# Patient Record
Sex: Female | Born: 1955
Health system: Southern US, Community
[De-identification: ages and names within clinical notes are randomized; demographics above are authoritative.]

## PROBLEM LIST (undated history)

## (undated) DIAGNOSIS — M75101 Unspecified rotator cuff tear or rupture of right shoulder, not specified as traumatic: Secondary | ICD-10-CM

## (undated) DIAGNOSIS — K219 Gastro-esophageal reflux disease without esophagitis: Secondary | ICD-10-CM

## (undated) DIAGNOSIS — M1990 Unspecified osteoarthritis, unspecified site: Secondary | ICD-10-CM

## (undated) DIAGNOSIS — F32A Depression, unspecified: Secondary | ICD-10-CM

## (undated) DIAGNOSIS — E785 Hyperlipidemia, unspecified: Secondary | ICD-10-CM

## (undated) DIAGNOSIS — Z862 Personal history of diseases of the blood and blood-forming organs and certain disorders involving the immune mechanism: Secondary | ICD-10-CM

## (undated) DIAGNOSIS — H269 Unspecified cataract: Secondary | ICD-10-CM

## (undated) DIAGNOSIS — T7840XA Allergy, unspecified, initial encounter: Secondary | ICD-10-CM

## (undated) DIAGNOSIS — F419 Anxiety disorder, unspecified: Secondary | ICD-10-CM

## (undated) DIAGNOSIS — F329 Major depressive disorder, single episode, unspecified: Secondary | ICD-10-CM

## (undated) DIAGNOSIS — J45909 Unspecified asthma, uncomplicated: Secondary | ICD-10-CM

## (undated) DIAGNOSIS — M810 Age-related osteoporosis without current pathological fracture: Secondary | ICD-10-CM

## (undated) DIAGNOSIS — J439 Emphysema, unspecified: Secondary | ICD-10-CM

## (undated) HISTORY — DX: Major depressive disorder, single episode, unspecified: F32.9

## (undated) HISTORY — DX: Allergy, unspecified, initial encounter: T78.40XA

## (undated) HISTORY — PX: EYE SURGERY: SHX253

## (undated) HISTORY — DX: Emphysema, unspecified: J43.9

## (undated) HISTORY — DX: Unspecified asthma, uncomplicated: J45.909

## (undated) HISTORY — DX: Hyperlipidemia, unspecified: E78.5

## (undated) HISTORY — PX: KNEE SURGERY: SHX244

## (undated) HISTORY — PX: ELBOW SURGERY: SHX618

## (undated) HISTORY — PX: DILATION AND CURETTAGE OF UTERUS: SHX78

## (undated) HISTORY — DX: Unspecified cataract: H26.9

## (undated) HISTORY — DX: Anxiety disorder, unspecified: F41.9

## (undated) HISTORY — PX: COLONOSCOPY: SHX174

## (undated) HISTORY — PX: ROTATOR CUFF REPAIR: SHX139

## (undated) HISTORY — PX: ABDOMINAL HYSTERECTOMY: SHX81

## (undated) HISTORY — PX: JOINT REPLACEMENT: SHX530

## (undated) HISTORY — DX: Depression, unspecified: F32.A

## (undated) HISTORY — PX: PARTIAL HYSTERECTOMY: SHX80

## (undated) HISTORY — DX: Age-related osteoporosis without current pathological fracture: M81.0

---

## 1997-11-05 ENCOUNTER — Other Ambulatory Visit: Admission: RE | Admit: 1997-11-05 | Discharge: 1997-11-05 | Payer: Self-pay | Admitting: Obstetrics and Gynecology

## 1999-07-15 ENCOUNTER — Other Ambulatory Visit: Admission: RE | Admit: 1999-07-15 | Discharge: 1999-07-15 | Payer: Self-pay | Admitting: Obstetrics and Gynecology

## 1999-07-22 ENCOUNTER — Ambulatory Visit (HOSPITAL_COMMUNITY): Admission: RE | Admit: 1999-07-22 | Discharge: 1999-07-22 | Payer: Self-pay | Admitting: Obstetrics and Gynecology

## 1999-07-22 ENCOUNTER — Encounter: Payer: Self-pay | Admitting: Obstetrics and Gynecology

## 2000-05-25 ENCOUNTER — Encounter: Admission: RE | Admit: 2000-05-25 | Discharge: 2000-05-25 | Payer: Self-pay | Admitting: Family Medicine

## 2000-05-25 ENCOUNTER — Encounter: Payer: Self-pay | Admitting: Family Medicine

## 2000-11-11 ENCOUNTER — Other Ambulatory Visit: Admission: RE | Admit: 2000-11-11 | Discharge: 2000-11-11 | Payer: Self-pay | Admitting: Obstetrics and Gynecology

## 2000-12-02 ENCOUNTER — Encounter: Payer: Self-pay | Admitting: Obstetrics and Gynecology

## 2000-12-02 ENCOUNTER — Ambulatory Visit (HOSPITAL_COMMUNITY): Admission: RE | Admit: 2000-12-02 | Discharge: 2000-12-02 | Payer: Self-pay | Admitting: Obstetrics and Gynecology

## 2001-05-16 ENCOUNTER — Ambulatory Visit (HOSPITAL_BASED_OUTPATIENT_CLINIC_OR_DEPARTMENT_OTHER): Admission: RE | Admit: 2001-05-16 | Discharge: 2001-05-16 | Payer: Self-pay | Admitting: Plastic Surgery

## 2001-05-16 ENCOUNTER — Encounter (INDEPENDENT_AMBULATORY_CARE_PROVIDER_SITE_OTHER): Payer: Self-pay | Admitting: Specialist

## 2001-12-08 ENCOUNTER — Encounter: Payer: Self-pay | Admitting: Gastroenterology

## 2001-12-08 ENCOUNTER — Encounter: Admission: RE | Admit: 2001-12-08 | Discharge: 2001-12-08 | Payer: Self-pay | Admitting: Gastroenterology

## 2002-12-07 ENCOUNTER — Encounter: Payer: Self-pay | Admitting: Obstetrics and Gynecology

## 2002-12-07 ENCOUNTER — Ambulatory Visit (HOSPITAL_COMMUNITY): Admission: RE | Admit: 2002-12-07 | Discharge: 2002-12-07 | Payer: Self-pay | Admitting: Obstetrics and Gynecology

## 2003-10-16 ENCOUNTER — Ambulatory Visit (HOSPITAL_COMMUNITY): Admission: RE | Admit: 2003-10-16 | Discharge: 2003-10-16 | Payer: Self-pay | Admitting: Obstetrics and Gynecology

## 2003-11-25 ENCOUNTER — Inpatient Hospital Stay (HOSPITAL_COMMUNITY): Admission: EM | Admit: 2003-11-25 | Discharge: 2003-11-28 | Payer: Self-pay | Admitting: Emergency Medicine

## 2003-11-25 ENCOUNTER — Encounter: Payer: Self-pay | Admitting: Obstetrics and Gynecology

## 2003-11-26 ENCOUNTER — Encounter (INDEPENDENT_AMBULATORY_CARE_PROVIDER_SITE_OTHER): Payer: Self-pay | Admitting: *Deleted

## 2004-03-04 ENCOUNTER — Ambulatory Visit (HOSPITAL_BASED_OUTPATIENT_CLINIC_OR_DEPARTMENT_OTHER): Admission: RE | Admit: 2004-03-04 | Discharge: 2004-03-04 | Payer: Self-pay | Admitting: Orthopedic Surgery

## 2004-06-15 ENCOUNTER — Ambulatory Visit (HOSPITAL_COMMUNITY): Admission: RE | Admit: 2004-06-15 | Discharge: 2004-06-15 | Payer: Self-pay | Admitting: Obstetrics and Gynecology

## 2004-07-03 ENCOUNTER — Encounter: Admission: RE | Admit: 2004-07-03 | Discharge: 2004-07-03 | Payer: Self-pay | Admitting: Family Medicine

## 2005-06-09 ENCOUNTER — Ambulatory Visit (HOSPITAL_BASED_OUTPATIENT_CLINIC_OR_DEPARTMENT_OTHER): Admission: RE | Admit: 2005-06-09 | Discharge: 2005-06-09 | Payer: Self-pay | Admitting: Orthopedic Surgery

## 2005-10-07 ENCOUNTER — Ambulatory Visit (HOSPITAL_COMMUNITY): Admission: RE | Admit: 2005-10-07 | Discharge: 2005-10-07 | Payer: Self-pay | Admitting: Obstetrics and Gynecology

## 2006-02-01 ENCOUNTER — Ambulatory Visit: Payer: Self-pay | Admitting: Internal Medicine

## 2006-02-11 ENCOUNTER — Ambulatory Visit: Payer: Self-pay | Admitting: Cardiology

## 2006-02-14 ENCOUNTER — Emergency Department (HOSPITAL_COMMUNITY): Admission: EM | Admit: 2006-02-14 | Discharge: 2006-02-14 | Payer: Self-pay | Admitting: Emergency Medicine

## 2006-02-15 ENCOUNTER — Ambulatory Visit: Payer: Self-pay | Admitting: Internal Medicine

## 2006-02-15 ENCOUNTER — Ambulatory Visit: Admission: RE | Admit: 2006-02-15 | Discharge: 2006-02-15 | Payer: Self-pay | Admitting: Internal Medicine

## 2007-02-09 ENCOUNTER — Ambulatory Visit (HOSPITAL_COMMUNITY): Admission: RE | Admit: 2007-02-09 | Discharge: 2007-02-09 | Payer: Self-pay | Admitting: Obstetrics and Gynecology

## 2009-01-22 ENCOUNTER — Ambulatory Visit (HOSPITAL_COMMUNITY): Admission: RE | Admit: 2009-01-22 | Discharge: 2009-01-22 | Payer: Self-pay | Admitting: Obstetrics and Gynecology

## 2009-04-11 ENCOUNTER — Encounter: Admission: RE | Admit: 2009-04-11 | Discharge: 2009-04-11 | Payer: Self-pay | Admitting: Orthopedic Surgery

## 2010-04-14 ENCOUNTER — Other Ambulatory Visit: Payer: Self-pay | Admitting: Orthopedic Surgery

## 2010-04-14 DIAGNOSIS — M25511 Pain in right shoulder: Secondary | ICD-10-CM

## 2010-04-15 ENCOUNTER — Ambulatory Visit
Admission: RE | Admit: 2010-04-15 | Discharge: 2010-04-15 | Disposition: A | Discharge status: Home / Self Care | Payer: BC Managed Care – PPO | Source: Ambulatory Visit | Attending: Orthopedic Surgery | Admitting: Orthopedic Surgery

## 2010-04-15 DIAGNOSIS — M25511 Pain in right shoulder: Secondary | ICD-10-CM

## 2010-07-24 NOTE — Op Note (Signed)
NAMEDELAILA, NAND                ACCOUNT NO.:  [id number]   MEDICAL RECORD NO.:  [id number]          PATIENT TYPE:  AMB   LOCATION:  DSC                          FACILITY:  [facility]   PHYSICIAN:  Loreta Ave, M.D. DATE OF BIRTH:  30-Mar-1955   DATE OF PROCEDURE:  03/04/2004  DATE OF DISCHARGE:                                 OPERATIVE REPORT   PREOPERATIVE DIAGNOSES:  Chronic lateral epicondylitis right elbow with  tearing extensor carpi radialis brevis tendon.   POSTOPERATIVE DIAGNOSES:  Chronic lateral epicondylitis right elbow with  tearing extensor carpi radialis brevis tendon.   OPERATION PERFORMED:  Exploration, debridement, drilling and repair of  common extensor tendon at lateral epicondyle, right elbow.   SURGEON:  Loreta Ave, M.D.   ASSISTANT:  Genene Churn. Denton Meek.   ANESTHESIA:  Infraclavicular block.   SPECIMENS:  None.   CULTURES:  None.   COMPLICATIONS:  None.   DRESSING:  Soft compressive.   TOURNIQUET TIME:  45 minutes.   DESCRIPTION OF PROCEDURE:  The patient was brought to the operating room and  after adequate anesthesia had been obtained, tourniquet applied to the upper  aspect of the right arm.  Prepped and draped in the usual sterile fashion.  Exsanguinated with elevation and Esmarch.  Tourniquet inflated to 250 mmHg.  Incision from the lateral epicondyle distal.  Skin and subcutaneous tissue  divided.  Superficial extensors intact, divided longitudinally.  The deep  extensor carpi radialis brevis tendon had complete tearing off the  epicondyle.  Marked degeneration extending 1 cm down.  All the abnormal  tissue including the contracted torn capsule below were all debrided.  Joint  inspected without abnormalities.  Wound irrigated.  Care taken to remove all  inflammatory as well as mucinous debris.  Epicondyle cleared and treated  with multiple drilling for later repair.  Retractors removed.  The  superficial extensors were closed  side-to-side incorporating ECRB tendon  maintaining good longitudinal continuity.  This was done with  a running Vicryl.  Skin and subcutaneous tissue closed with Vicryl.  Margins  of the wound injected with Marcaine.  Steri-Strips applied.  Sterile  compressive dressing with a sugar tong splint applied.  Tourniquet deflated.  Anesthesia reversed.  Brought to recovery room.  Tolerated surgery well  without complication.      Valentino Saxon   DFM/MEDQ  D:  03/04/2004  T:  03/04/2004  Job:  [id number]

## 2010-07-24 NOTE — Op Note (Signed)
Chelsea Griffith, Chelsea Griffith NO.:  [id number]   MEDICAL RECORD NO.:  [id number]           PATIENT TYPE:   LOCATION:                                 FACILITY:   PHYSICIAN:  Loreta Ave, M.D.      DATE OF BIRTH:   DATE OF PROCEDURE:  06/09/2005  DATE OF DISCHARGE:                                 OPERATIVE REPORT   PREOPERATIVE DIAGNOSES:  1.  Radial tunnel syndrome, right elbow.  2.  Plantar fasciitis, left heel.   POSTOPERATIVE DIAGNOSES:  1.  Radial tunnel syndrome, right elbow.  2.  Plantar fasciitis, left heel.   OPERATIVE PROCEDURES:  1.  Radial tunnel decompression through anterior approach, right elbow.  2.  Injection plantar fascial attachment to os calcis through a plantar      approach with Depo-Medrol and Marcaine.   SURGEON:  Loreta Ave, M.D.   ASSISTANT:  Zonia Kief, P.A.   ANESTHESIA:  General.   BLOOD LOSS:  Minimal.   TOURNIQUET TIME:  45 minutes right arm.   SPECIMENS:  None.   CULTURES:  None.   COMPLICATIONS:  None.   DRESSING:  Soft compressive right arm, Band-Aid on heel.   PROCEDURE:  The patient was brought to the operating room and after  anesthesia had been obtained, attention was first turned to the heel.  The  area of marked tenderness I had assessed preop was prepped with Betadine and  then alcohol.  From a plantar approach, I then injected the plantar fascia  attachment to the os calcis directly on the deep side with Depo-Medrol 40 mg  and Marcaine 0.5% with 2 cc.  This was accomplished without difficulty.  Band-Aid applied.   Attention turned to the arm.  The tourniquet applied.  Prepped and draped in  the usual sterile fashion.  Exsanguinated and elevation with Esmarch.  Tourniquet inflated to 250 mmHg.  Longitudinal incision in the anterior  aspect of the elbow just distal to the wrist crease along the border of the  mobile extensor muscles.  The skin and subcutaneous tissues divided.  The  mobile  extensors were then carefully freed up with blunt dissection,  retracted exposing the radial tunnel region.  Crossing venous structures  carefully cauterized with bipolar cautery.  Radial nerve identified proximal  to the bifurcation.  Followed distally.  No compression of the sensory  branch at all but these retracted well distally.  Motor branch was then  followed down to the arcade of Frosche at the origin of the supinator.  I  confirmed constriction of the nerve at that level when the forearm was  pronated.  We then pronated the forearm.  I carefully released all pressure  on the deep branch radial nerve from that level well distally so it was  completely decompressed.  At completion, when the forearm was pronated or  supinated no compression on the nerve at all.  The nerve was then retracted  back up to the bifurcation to be sure there was no pressure on the nerve  throughout.  The wound was irrigated.  A few Vicryls were placed deep just  to reapproximate tissue.  Because she had reacted to subcutaneous Vicryl, we  did not use this.  Her reaction was due to the fact that she would form  stitch abscesses and reject it.  Vicryl before had dissolved, so we did not  want to use anything up close to the skin.  The skin was closed with nylon.  Wound injected with Marcaine.  Sterile compressive dressing applied.  Tourniquet deflated.  Anesthesia reversed.  Brought to the recovery room.  Tolerated surgery well.  No complications.      Loreta Ave, M.D.  Electronically Signed     DFM/MEDQ  D:  06/09/2005  T:  06/10/2005  Job:  [id number]

## 2010-07-24 NOTE — Assessment & Plan Note (Signed)
[facility]                             PULMONARY OFFICE NOTE   [name], Griffith                       MRN:          [id number]  DATE:02/01/2006                            DOB:          1955/08/20    CHIEF COMPLAINT:  Cough.   HISTORY:  This is a 55 year old white female, remote smoker, with a  history of prior asthma evaluated by Dr. Willa Rough but not requiring any  kind of chronic therapy or having any symptoms until 4 weeks ago at the  onset of severe coughing paroxysms.  This has been productive of minimum  amounts of mucus but seems worse as the day goes on, does not typically  wake her up at night.  She denies any fevers, chills, sweats, orthopnea,  PND, or leg swelling, is actively on Augmentin but has already been on  several different antibiotics and several courses of prednisone with no  benefit.   The patient denies any excessive postnasal drainage or sensation of  postnasal drainage, sinus complaints at present, itching, sneezing,  subjective wheezing, overt reflux symptoms.   PAST MEDICAL HISTORY:  Significant for hysterectomy and C-sections only.   ALLERGIES:  None known.   MEDICATIONS:  Clonazepam, Zoloft, Mannest, trazodone, and Qvar 80 one  puff b.i.d. started at the onset of the cough with no benefit.   SOCIAL HISTORY:  She quit smoking in 1986, works as a Social research officer, government.   FAMILY HISTORY:  Significant for allergies in her mother, negative for  atopy otherwise or asthma.   REVIEW OF SYSTEMS:  Taken in detail on the work sheet and significant  for the problems outlined above.   PHYSICAL EXAMINATION:  GENERAL:  This is a pleasant, ambulatory white  female in no acute distress.  She is afebrile with normal vital signs.  She does clear her throat frequently during her interview and exam.  HEENT:  Reveal mild cobblestoning of the oropharynx.  However, there is  no evidence of excessive postnasal drainage.  Nasal turbinates  normal,  ear canals clear bilaterally.  NECK:  Supple without cervical adenopathy or tenderness.  Trachea is  midline, no thyromegaly.  LUNG FIELDS:  Perfectly clear bilaterally to auscultation and percussion  with no cough elicited on inspiratory or expiratory maneuvers.  HEART:  Regular rhythm without murmur, gallop, rub.  ABDOMEN:  Soft, benign.  EXTREMITIES:  Warm without calf tenderness, cyanosis, clubbing, or  edema.   Chest x-rays reviewed from November 3 and January 15, 2006, show no  evidence of convincing pneumonia.   IMPRESSION:  Classic cyclical cough, acute onset 4 weeks ago with  frequent throat clearing now and failed to respond to antibiotics and  prednisone x2 cycles.  Most likely the mechanism driving the cough at  this point is reflux, although I certainly cannot identify the incipient  cause based on the history I obtained today.   I spent extra time with this patient describing reflux as the only cause  of chronic cough that actually causes the other two and does not respond  to prednisone and recommended that she take  Zegerid 40 mg at bedtime for  the next 4 weeks, suppress cyclical coughing with Delsym combined with  tramadol, and take a 6-day course of prednisone and off of Qvar to  eliminate any inflammatory component of the cough which may be driven by  the cough itself.  She asked a reasonable question, that is why  prednisone did not work in the first place.  In this particular example,  it is the trauma of coughing that generates the airway inflammation and  unless the cough is controlled, the inflammation will go on unabated and  therefore not respond well to prednisone.  The difference on this  occasional is that I have recommended heavy cough suppression in  combination with prednisone cycle.     Chelsea Griffith. Chelsea Sires, MD, [facility]  Electronically Signed    MBW/MedQ  DD: 02/01/2006  DT: 02/02/2006  Job #: [id number]

## 2010-07-24 NOTE — Discharge Summary (Signed)
[name], [name]                ACCOUNT NO.:  [id number]   MEDICAL RECORD NO.:  [id number]          PATIENT TYPE:  INP   LOCATION:  0365                         FACILITY:  [facility]   PHYSICIAN:  Hal Morales, M.D.DATE OF BIRTH:  05-Dec-1955   DATE OF ADMISSION:  11/25/2003  DATE OF DISCHARGE:  11/28/2003                                 DISCHARGE SUMMARY   DISCHARGE DIAGNOSES:  1.  Left hydrosalpinx.  2.  Pelvic adhesions.  3.  Status post hysterectomy.  4.  Asthma.   OPERATION:  On the date of admission, the patient underwent a laparoscopic  lysis of adhesions along with a laparotomy, with the excision of a left  adnexal mass, lysis of adhesions, and right salpingo-oophorectomy,  tolerating all procedures well.  The patient, who is status post  hysterectomy, was found to have dense pelvic adhesions encompassing the  right tubo-ovarian complex, a 9 cm x 4 cm fluid-fluid left adnexal mass  (questionable tube and ovary).   HISTORY OF PRESENT ILLNESS:  Chelsea Griffith is a 55 year old married white female  status post vaginal hysterectomy para 2-0-1-2 who presents for exploratory  laparotomy for excision of a left adnexal mass.  Please see the patient's  dictated history and physical examination for details.   PREOPERATIVE PHYSICAL EXAMINATION:  BLOOD PRESSURE:  108/70.  WEIGHT:  146.  HEIGHT:  5 feet 4 inches.  GENERAL:  Within normal limits.  PELVIC:  EGBUS within normal limits.  Vaginal is rugose.  Cervix and uterus  are surgically absent.  In the adnexa, the patient has fullness with  tenderness in her left adnexa.  Her right adnexa is without tenderness or  masses.  Rectovaginal exam confirms.   HOSPITAL COURSE:  On the date of admission, the patient underwent  aforementioned procedures, tolerating them all well.  Postoperative course  was unremarkable, with patient resuming bowel and bladder function by postop  day two and therefore deemed ready for discharge home.   Postoperative  hemoglobin was 11 (preop hemoglobin 13.6).   DISCHARGE MEDICATIONS:  1.  Zoloft 100 mg daily.  2.  Clonazepam 0.5 mg b.i.d.  3.  __________ 0.625 mg daily.  4.  Albuterol inhaler 2 puffs up to q.i.d. as needed.  5.  Colace 100 mg 1 tablet b.i.d. until bowel movements are regular.  6.  Anaprox DS 1 tablet with food b.i.d. for five days, then as needed for      pain.  7.  Phenergan 25 mg 1 tablet q.6 h. as needed for nausea.  8.  Darvocet-N 100, 1-2 tablets q.4 h. as needed for pain.  9.  Percocet 1-2 tablets q.4 h. as needed for pain if Darvocet is not      adequate.   FOLLOWUP:  The patient is scheduled for followup with Dr. Pennie Rushing on  January 07, 2004 at 4:00 p.m.   DISCHARGE INSTRUCTIONS:  The patient was given a copy of [address] [place]  [facility] postoperative instruction sheet.  She was further advised to avoid  driving for two weeks, heavy lifting for four weeks, and intercourse for six  weeks.   The patient's diet was without restriction.   FINAL PATHOLOGY:  Left fallopian tube (salpingectomy):  Hydrosalpinx.  No  malignancy identified.  Adherent fragment of ovarian parenchyma.  Right  ovary and fallopian tube (salpingo-oophorectomy):  Ovary:  Benign ovary with  surface adhesions.  Fallopian tube:  Benign fallopian tube with surface  adhesions.      EJP/MEDQ  D:  12/17/2003  T:  12/18/2003  Job:  [id number]

## 2010-07-24 NOTE — H&P (Signed)
NAME:  Chelsea Griffith, UTKE NO.:  [id number]   MEDICAL RECORD NO.:  [id number]                   PATIENT TYPE:   LOCATION:                                       FACILITY:   PHYSICIAN:  Hal Morales, M.D.             DATE OF BIRTH:  1955/11/17   DATE OF ADMISSION:  DATE OF DISCHARGE:                                HISTORY & PHYSICAL   HISTORY OF PRESENT ILLNESS:  Ms. Chelsea Griffith is a 55 year old married white female  status post total vaginal hysterectomy, para 2-0-1-2, who presents for  exploratory laparotomy for excision of a left adnexal mass. In May 2005 the  patient began to experience intermittent, sharp, stabbing left hip pain  followed by an achy sensation. Her symptoms would increase with sustained  walking, lying on either side to sleep, and with any external rotation of  her left hip. She was initially seen by a chiropractor who relieved a  previous episode of similar discomfort on the right, but her symptoms did  not abate. She then saw an orthopedist who prescribed Mobic 15 mg and  hydrocodone, however neither significantly diminished her pain. The patient  did find some relief from inactivity. She denied any fever, urinary tract  symptoms, change in bowel habits, or loss of strength in her legs. An MRI on  September 23, 2003, of her left hip revealed mild greater trochanteric bursitis  of the left hip and a complex cystic lesion in her left pelvis measuring 4.0  x 5.0 x 10 cm. A follow-up pelvic ultrasound showed a normal-appearing right  ovary and a fluid-filled left adnexal structure measuring 11.6 x 2.6 x 4.0  cm which most likely represents a hydrosalpinx. The left ovary could not be  identified with certainty. A CA-125 done on October 15, 2003, was within  normal limits. After these reviewing these findings with the patient along  with her understanding that removal of the left adnexal mass may not  alleviate her pain, the patient has decided to  proceed with removal of this  left adnexal mass. Additionally, the patient is requesting removal of both  ovaries.   OBSTETRIC HISTORY:  Gravida 3, para 2-0-1-2. The patient had a Cesarean  section in 1986 and in 1992.   GYNECOLOGIC HISTORY:  Menarche at 55 years of age. The patient had a  hysterectomy. She denies any history of sexually transmitted diseases. She  has a remote history of an abnormal Pap smear on one occasion; however  subsequent ones have returned normal. Last mammogram was December 10, 2003,  within normal limits. Last Pap smear was November 12, 2003, and is pending.   PAST MEDICAL HISTORY:  Positive for anxiety, depression, left hip bursitis,  asthma, and endometriosis.   PAST SURGICAL HISTORY:  In 1995 D&C  and in 1998 total vaginal hysterectomy.   ALLERGIES:  The patient admits to severe nausea and vomiting following  general anesthesia. Denies any history of blood transfusions. The patient  denies any known drug allergies.   FAMILY HISTORY:  Positive for diabetes, hypertension, heart disease, and  thyroid disease.   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   HABITS:  She denies use of tobacco and drinks one serving of alcohol daily.   CURRENT MEDICATIONS:  1.  Zoloft 100 mg daily.  2.  Klonopin 0.5 mg daily.  3.  __________ 0.625 mg daily.  4.  Aspirin 81 mg daily.  5.  Albuterol inhaler p.r.n.   REVIEW OF SYSTEMS:  Positive for patient wearing glasses. She also had an  injection of cortisone in her right elbow on November 13, 2003. Otherwise,  review of systems is negative except as mentioned in the history of present  illness.   PHYSICAL EXAMINATION:  VITAL SIGNS: Blood pressure 108/70, weight 146,  height 5 feet 4 inches tall.  NECK: Supple. There is no thyromegaly or adenopathy.  HEART: Regular rate and rhythm. There is no murmur.  LUNGS: Clear. There are no rales, rhonchi, or wheezes.  BACK: No costovertebral  angle tenderness.  ABDOMEN: Bowel sounds present and soft with  mild left lower quadrant  tenderness. No guarding, rebound, or organomegaly.  EXTREMITIES: Without clubbing, cyanosis, or edema.  PELVIC EXAM: EGBUS within normal limits. Vagina is rugous. Cervix and uterus  are surgically absent. Adnexa--the patient has fullness with tenderness in  her left adnexa. Right adnexa without tenderness or masses. Rectovaginal  exam confirms.   IMPRESSION:  Left adnexal mass.   DISPOSITION:  Discussion was held with patient regarding her indications for  her procedure along with its risks, which include but are not limited to  reaction to anesthesia, damage to adjacent organs, infection, and excessive  bleeding. The patient further verbalizes understanding that removal of this  mass may not relieve her left hip/pelvic pain. The patient has requested  that both ovaries be removed during the process of this procedure. She has  therefore consented to undergo exploratory laparotomy with removal of a left  adnexal mass and bilateral salpingo-oophorectomy at [facility] on November 26, 2003, at 7:30 a.m.     Chelsea Griffith.                    Hal Morales, M.D.    EJP/MEDQ  D:  11/20/2003  T:  11/20/2003  Job:  [id number]

## 2010-07-24 NOTE — Op Note (Signed)
Chelsea Griffith, Chelsea Griffith                ACCOUNT NO.:  [id number]   MEDICAL RECORD NO.:  [id number]          PATIENT TYPE:  AMB   LOCATION:  DAY                          FACILITY:  [facility]   PHYSICIAN:  Hal Morales, M.D.DATE OF BIRTH:  Jul 12, 1955   DATE OF PROCEDURE:  11/26/2003  DATE OF DISCHARGE:                                 OPERATIVE REPORT   PREOPERATIVE DIAGNOSES:  1.  Left adnexal mass, status post hysterectomy.  2.  Asthma.   POSTOPERATIVE DIAGNOSES:  1.  Left adnexal mass, status post hysterectomy.  2.  Asthma.  3.  Left hydrosalpinx and significant pelvic adhesions.   OPERATION:  1.  Laparoscopy.  2.  Lysis of adhesions.  3.  Laparotomy with excision of left adnexal mass, lysis of adhesions.  4.  Right salpingo-oophorectomy.   SURGEON:  Dr. Dierdre Forth   FIRST ASSISTANT:  Henreitta Leber, PA-C   ANESTHESIA:  General orotracheal.   ESTIMATED BLOOD LOSS:  125 mL.   URINE OUTPUT:  125 mL.   COMPLICATIONS:  None.   FINDINGS:  The patient was status post vaginal hysterectomy.  She had dense  pelvic adhesions encompassing the right tube and ovary and the large bowel.  There were likewise adhesions between the large bowel and the right pelvic  sidewall.  The left adnexa contained a 9 x 4 cm fluid-filled left adnexal  mass, most consistent with a hydrosalpinx.  No separate ovarian tissue could  be identified.   DESCRIPTION OF PROCEDURE:  The patient was taken to the operating room after  appropriate identification and placed on the operating table.  After the  attainment of adequate general anesthesia, she was placed in the modified  lithotomy position.  The abdomen, perineum, and the vagina were prepped with  multiple layers of Betadine.  A Foley catheter was inserted under sterile  conditions and connected to straight drainage.  A sponge-stick containing  two sponges were swept in the vagina for elevation of the vaginal cuff.  The  abdomen was draped as a  sterile field.  Subumbilical and suprapubic  injection of 0.25% Marcaine for a total of 16 mL was undertaken.  A  subumbilical incision was made and the Veress cannula placed through that  incision into the peritoneal cavity.  A pneumoperitoneum was created with 3  L of CO2.  The Veress cannula was removed and the laparoscopic trocar placed  through that incision into the peritoneal cavity.  The laparoscope was  placed through the trocar sleeve.  Suprapubic incisions were made to the  right and left of midline and laparoscopic probe trocars placed through  those incisions into the peritoneal cavity under direct visualization.  The  above-noted findings were made and documented.  Lysis of adhesions was  undertaken; however, in spite of lysis of adhesions, the right ovary could  not be identified.  A decision was made to proceed to laparotomy.  All  instruments were removed from the peritoneal cavity under direct  visualization as the CO2 was allowed to escape.  The subumbilical incision  was closed first with a fascial suture  of 0 Vicryl in a figure-of-eight  configuration.  Then a subcuticular suture was used to close the skin with 3-  0 Vicryl suture.  The suprapubic incision was extended between the two  incisions for the suprapubic port and taken to the entry into the peritoneal  cavity.  A self-retaining O'Connor-O'Sullivan retractor was placed in the  incision and the bowel packed cephalad.  Lysis of adhesions between the  small bowel, large bowel, and vaginal cuff as well as the right pelvic  somewhat was undertaken with a combination of blunt and sharp dissection.  The lysis of adhesions on the left side was undertaken as well.  The large  cystic pelvic mass was elevated into the operative field and noted to be  connected to the left pelvic sidewall at the area of the round ligament.  With a combination of blunt and sharp dissection, it was further elevated  into the operative field  and noted to be connected near the  infundibulopelvic ligament.  The infundibulopelvic ligament was isolated and  the left ureter identified.  The infundibulopelvic ligament was clamped,  cut, and suture ligated with a suture of 0 Vicryl, and the remainder of the  base of the cystic structure was clamped, cut, and suture ligated with 0  Vicryl and the large cystic mass removed from the operative field.  Further  lysis of adhesions was undertaken in the right adnexa and pelvic sidewall.  The infundibulopelvic ligament on that side was identified as was the  ureter.  The infundibulopelvic ligament was isolated, clamped, cut, tied  with a free tie, and suture ligated.  The right tube and ovary were elevated  into the operative field, and the base which was connected to the round  ligament and right pelvic sidewall was clamped, cut, and suture ligated.  Copious irrigation was carried out and hemostatic sutures of 2-0 Vicryl used  to achieve hemostasis in the right and left pelvic sidewall.  Once  hemostasis was adequate, all instruments were removed from the peritoneal  cavity and the abdominal peritoneum closed with running suture of 2-0  Vicryl.  The rectus muscles were reapproximated in the midline with figure-  of-eight suture of 2-0 Vicryl.  The rectus fascia was closed with running  sutures of 0 Vicryl from each apex to the midline and tied in the midline.  The subcutaneous tissue was irrigated and made hemostatic with Bovie  electrocautery.  A subcuticular suture of 3-0 Monocryl was used to close the  skin incision.  A sterile dressing was applied after Steri-Strips had been  applied and the patient taken from the operating room to the recovery room  in satisfactory condition having tolerated the procedure well with sponge  and instrument counts correct.  Specimens to pathology:  Right tube and  ovary, left adnexal mass.      VPH/MEDQ  D:  11/26/2003  T:  11/26/2003  Job:  [id number]

## 2010-07-24 NOTE — Assessment & Plan Note (Signed)
[facility]                             PULMONARY OFFICE NOTE   Chelsea Griffith, Chelsea Griffith                       MRN:          [id number]  DATE:02/15/2006                            DOB:          [birth year]    HISTORY OF PRESENT ILLNESS:  This is a 55 year old white female with new  onset cough who carries a diagnosis of asthma since 1994 but with  refractory cough dating back to October 2007 for which I thought a  classic cyclical mechanism was present when I saw her on February 01, 2006 and reviewed with her in writing a way to eliminate the cycle of  coughing by addressing both the cough itself, GERD (which is associated  with cyclical coughing) and the inflammation with prednisone. She  misunderstood the instructions and did not do all three at once as I  recommended to extinguish the cycle. She comes now and frustrated that  she is no better and actually went to the emergency room yesterday for  wheezing. The chief complaint when she came to the emergency room was  actually weakness. She was given one nebulizer treatment which did not  seem to help much and comes back today with still complaints of fatigue  associated with severe coughing paroxysms. She has not actually able to  bring up any mucus.   MEDICATIONS AT PRESENT:  Note she is no longer on Qvar. Is on both  clonazepam, Zoloft, and trazodone and Zegerd at bedtime.   PHYSICAL EXAMINATION:  GENERAL:  She is a despondent white female with  hopeless, helpless affect and attitude.  VITAL SIGNS:  She has afebrile vital signs.  HEENT:  Reveals minimal turbinate edema, oropharynx clear. There is  minimal cobblestoning but no evidence of excessive postnasal drainage.  NECK:  Supple without cervical adenopathy or tenderness. Trachea is  midline.  LUNGS:  Lung fields perfectly clear bilaterally to auscultation and  percussion.  HEART:  There is a regular rhythm without murmur, gallop or rub.   ABDOMEN:  Soft, benign.  EXTREMITIES:  Warm without any calf tenderness, cyanosis or clubbing.   Sinus CT scan was reviewed with the patient from February 12, 2006 and  shows very minimal thickening. Chest x-ray was reviewed from yesterday  and is normal.   IMPRESSION:  Classical cyclical cough in this patient previously felt to  have asthma but now refractory to prednisone and did not do well with  either Qvar or albuterol. I suspect the mechanism driving the cough is  the cough itself via cyclical mechanism where she coughs so hard she  traumatizes the airway. This leads to more airway inflammation and  increases her sensitivity to stomach contents both acid and nonacid. To  test this hypothesis I rewrote her medication, her instructions for her,  and reviewed them line by line as follows:   1. Continue Zegerd 40 mg q.h.s. for the next two weeks (samples only)      until we prove that this is the mechanism. Add Reglan 10 mg before      meals and at bedtime over  the next two weeks to see to what extent      this improves the urge to cough and in the meantime to completely      suppress the cough with a combination of Mucinex DM two b.i.d. and      tramadol every four hours.  2. To treat the inflammation associated with chronic cough, I      recommended an eight day course of prednisone and a followup here      by my nurse practitioner in two weeks.   If she continues to cough, the next step would be to perform a  methacholine challenge test and to consider bronchoscopy to exclude  eosinophilic bronchitis from the diagnosis. When she returns here in two  weeks if not better, I would add Bromfed one b.i.d. to eliminate  postnasal drip syndrome from the differential and also proceed with a  methacholine challenge test and consider bronchoscopy as a last resort  of looking for evidence of eosinophilic bronchitis.     Chelsea Griffith. Chelsea Sires, MD, [facility]  Electronically Signed    MBW/MedQ   DD: 02/15/2006  DT: 02/15/2006  Job #: [id number]   cc:   Carren Rang, M.D.  Roselyn M. Willa Rough, M.D.

## 2010-08-25 ENCOUNTER — Other Ambulatory Visit (HOSPITAL_COMMUNITY): Payer: Self-pay | Admitting: Orthopedic Surgery

## 2010-08-27 ENCOUNTER — Ambulatory Visit (HOSPITAL_COMMUNITY)
Admission: RE | Admit: 2010-08-27 | Discharge: 2010-08-27 | Disposition: A | Discharge status: Home / Self Care | Payer: BC Managed Care – PPO | Source: Ambulatory Visit | Attending: Orthopedic Surgery | Admitting: Orthopedic Surgery

## 2010-08-27 DIAGNOSIS — Z13820 Encounter for screening for osteoporosis: Secondary | ICD-10-CM | POA: Insufficient documentation

## 2010-08-27 DIAGNOSIS — Z780 Asymptomatic menopausal state: Secondary | ICD-10-CM | POA: Insufficient documentation

## 2011-11-02 ENCOUNTER — Telehealth: Payer: Self-pay | Admitting: Obstetrics and Gynecology

## 2011-11-02 NOTE — Telephone Encounter (Signed)
Tc to pt per telephone call. Pt wants to know if we have record of last colonoscopy approx 7-10 yrs ago. Told pt no record for colonoscopy report in chart. Pt voices understanding.

## 2011-11-02 NOTE — Telephone Encounter (Signed)
vph pt 

## 2012-02-02 ENCOUNTER — Ambulatory Visit: Payer: BC Managed Care – PPO | Admitting: Obstetrics and Gynecology

## 2012-03-06 ENCOUNTER — Other Ambulatory Visit: Payer: Self-pay | Admitting: Family Medicine

## 2012-03-06 ENCOUNTER — Ambulatory Visit
Admission: RE | Admit: 2012-03-06 | Discharge: 2012-03-06 | Disposition: A | Discharge status: Home / Self Care | Payer: BC Managed Care – PPO | Source: Ambulatory Visit | Attending: Family Medicine | Admitting: Family Medicine

## 2012-03-06 DIAGNOSIS — R230 Cyanosis: Secondary | ICD-10-CM

## 2012-10-23 ENCOUNTER — Encounter: Payer: Self-pay | Admitting: Family Medicine

## 2012-10-23 ENCOUNTER — Ambulatory Visit
Admission: RE | Admit: 2012-10-23 | Discharge: 2012-10-23 | Disposition: A | Discharge status: Home / Self Care | Payer: BC Managed Care – PPO | Source: Ambulatory Visit | Attending: Family Medicine | Admitting: Family Medicine

## 2012-10-23 ENCOUNTER — Ambulatory Visit (INDEPENDENT_AMBULATORY_CARE_PROVIDER_SITE_OTHER): Payer: BC Managed Care – PPO | Admitting: Family Medicine

## 2012-10-23 VITALS — BP 120/60 | HR 88 | Temp 98.2°F | Resp 24

## 2012-10-23 DIAGNOSIS — J45901 Unspecified asthma with (acute) exacerbation: Secondary | ICD-10-CM

## 2012-10-23 MED ORDER — AZITHROMYCIN 250 MG PO TABS: ORAL_TABLET | ORAL | Status: DC

## 2012-10-23 MED ORDER — METHYLPREDNISOLONE ACETATE 80 MG/ML IJ SUSP
80.0000 mg | Freq: Once | INTRAMUSCULAR | Status: AC
  Administered 2012-10-23: 80 mg via INTRAMUSCULAR

## 2012-10-23 NOTE — Progress Notes (Signed)
  Subjective:    Patient ID: Chelsea Griffith, female    DOB: February 04, 1956, 57 y.o.   MRN: [id number]  HPI  Patient reports a worsening cough for one week. She denies any fever or chills. Cough has become unrelenting and persistent. She states this is how her asthma attacks usually present. She is also reporting increasing shortness of breath and wheezing.  She is been using her albuterol at home every 8 hours with mild relief. She is coughing nonstop in the clinic today. She is unable to complete a sentence without coughing. Past Medical History  Diagnosis Date  . Asthma   . Hyperlipidemia   . Anxiety   . Depression    Current outpatient prescriptions:budesonide-formoterol (SYMBICORT) 160-4.5 MCG/ACT inhaler, Inhale 2 puffs into the lungs., Disp: , Rfl: ;  azithromycin (ZITHROMAX) 250 MG tablet, 2 tabs poqday1, 2 tabs poqday 2-5, Disp: 6 tablet, Rfl: 0   NKDA  History   Social History  . Marital Status: Divorced    Spouse Name: N/A    Number of Children: N/A  . Years of Education: N/A   Occupational History  . Not on file.   Social History Main Topics  . Smoking status: Never Smoker   . Smokeless tobacco: Not on file  . Alcohol Use: Not on file  . Drug Use: Not on file  . Sexual Activity: Not on file   Other Topics Concern  . Not on file   Social History Narrative  . No narrative on file     Review of Systems  All other systems reviewed and are negative.       Objective:   Physical Exam  Vitals reviewed. Constitutional: She appears well-developed and well-nourished.  HENT:  Right Ear: External ear normal.  Left Ear: External ear normal.  Nose: Nose normal.  Mouth/Throat: Oropharynx is clear and moist. No oropharyngeal exudate.  Eyes: Conjunctivae are normal. Pupils are equal, round, and reactive to light.  Neck: Neck supple. No thyromegaly present.  Cardiovascular: Normal rate, regular rhythm and normal heart sounds.   Pulmonary/Chest: Effort normal. No  respiratory distress. She has wheezes. She has no rales. She exhibits no tenderness.  Abdominal: Soft. Bowel sounds are normal. She exhibits no distension. There is no tenderness. There is no rebound.  Lymphadenopathy:    She has no cervical adenopathy.   patient is coughing nonstop in the clinic today.        Assessment & Plan:  1. Asthma with acute exacerbation Patient was given albuterol 2.5 mg neb x1.  Her coughing improved after the nebulizer treatment. She then given Depo-Medrol 80 mg IM x1. She was sent for a chest x-ray to rule out pneumonia or subclinical infection that I not able to hear. I also treated the patient for bronchitis with a Z-Pak as I feel that this was the likely trigger.  She is to call me tomorrow and I date. If her symptoms are improving I started the patient on a prednisone taper. Meanwhile use Ventolin 2 puffs inhaled every 6 hours when necessary. If she is not better, seek medical attention immediately - methylPREDNISolone acetate (DEPO-MEDROL) injection 80 mg; Inject 1 mL (80 mg total) into the muscle once. - DG Chest 2 View; Future

## 2012-10-24 ENCOUNTER — Telehealth: Payer: Self-pay | Admitting: Family Medicine

## 2012-10-24 MED ORDER — PREDNISONE 20 MG PO TABS: ORAL_TABLET | ORAL | Status: DC

## 2012-10-24 NOTE — Telephone Encounter (Signed)
.  Patient aware of results and told that ribs being sore is a direct result of all the coughing she is doing.

## 2012-10-24 NOTE — Telephone Encounter (Signed)
pred sent to pharmacy

## 2013-04-09 ENCOUNTER — Other Ambulatory Visit: Payer: BC Managed Care – PPO

## 2013-04-09 DIAGNOSIS — Z0000 Encounter for general adult medical examination without abnormal findings: Secondary | ICD-10-CM

## 2013-04-09 LAB — COMPREHENSIVE METABOLIC PANEL
ALBUMIN: 4.4 g/dL (ref 3.5–5.2)
ALK PHOS: 56 U/L (ref 39–117)
ALT: 15 U/L (ref 0–35)
AST: 17 U/L (ref 0–37)
BUN: 19 mg/dL (ref 6–23)
CO2: 28 mEq/L (ref 19–32)
CREATININE: 0.85 mg/dL (ref 0.50–1.10)
Calcium: 9.6 mg/dL (ref 8.4–10.5)
Chloride: 106 mEq/L (ref 96–112)
GLUCOSE: 108 mg/dL — AB (ref 70–99)
Potassium: 4.5 mEq/L (ref 3.5–5.3)
SODIUM: 144 meq/L (ref 135–145)
TOTAL PROTEIN: 6.3 g/dL (ref 6.0–8.3)
Total Bilirubin: 0.3 mg/dL (ref 0.2–1.2)

## 2013-04-09 LAB — CBC WITH DIFFERENTIAL/PLATELET
BASOS ABS: 0 10*3/uL (ref 0.0–0.1)
BASOS PCT: 0 % (ref 0–1)
EOS ABS: 0.1 10*3/uL (ref 0.0–0.7)
EOS PCT: 2 % (ref 0–5)
HCT: 38.8 % (ref 36.0–46.0)
Hemoglobin: 13.5 g/dL (ref 12.0–15.0)
LYMPHS ABS: 1.3 10*3/uL (ref 0.7–4.0)
LYMPHS PCT: 26 % (ref 12–46)
MCH: 29.9 pg (ref 26.0–34.0)
MCHC: 34.8 g/dL (ref 30.0–36.0)
MCV: 85.8 fL (ref 78.0–100.0)
MONO ABS: 0.3 10*3/uL (ref 0.1–1.0)
MONOS PCT: 6 % (ref 3–12)
NEUTROS PCT: 66 % (ref 43–77)
Neutro Abs: 3.2 10*3/uL (ref 1.7–7.7)
PLATELETS: 153 10*3/uL (ref 150–400)
RBC: 4.52 MIL/uL (ref 3.87–5.11)
RDW: 14 % (ref 11.5–15.5)
WBC: 4.9 10*3/uL (ref 4.0–10.5)

## 2013-04-09 LAB — LIPID PANEL
CHOLESTEROL: 234 mg/dL — AB (ref 0–200)
HDL: 59 mg/dL (ref >39)
LDL CALC: 156 mg/dL — AB (ref 0–99)
TRIGLYCERIDES: 95 mg/dL (ref <150)
Total CHOL/HDL Ratio: 4 Ratio
VLDL: 19 mg/dL (ref 0–40)

## 2013-04-09 LAB — TSH: TSH: 2.361 u[IU]/mL (ref 0.350–4.500)

## 2013-04-13 ENCOUNTER — Encounter: Payer: Self-pay | Admitting: Family Medicine

## 2013-04-13 ENCOUNTER — Ambulatory Visit (INDEPENDENT_AMBULATORY_CARE_PROVIDER_SITE_OTHER): Payer: BC Managed Care – PPO | Admitting: Family Medicine

## 2013-04-13 VITALS — BP 128/74 | HR 72 | Temp 97.5°F | Resp 16 | Ht 63.0 in | Wt 159.0 lb

## 2013-04-13 DIAGNOSIS — Z0000 Encounter for general adult medical examination without abnormal findings: Secondary | ICD-10-CM

## 2013-04-13 DIAGNOSIS — E785 Hyperlipidemia, unspecified: Secondary | ICD-10-CM

## 2013-04-13 DIAGNOSIS — Z23 Encounter for immunization: Secondary | ICD-10-CM

## 2013-04-13 MED ORDER — PRAVASTATIN SODIUM 20 MG PO TABS: 20.0000 mg | ORAL_TABLET | Freq: Every day | ORAL | Status: DC

## 2013-04-13 MED ORDER — CELECOXIB 200 MG PO CAPS: 200.0000 mg | ORAL_CAPSULE | Freq: Every day | ORAL | Status: DC

## 2013-04-13 NOTE — Progress Notes (Signed)
Subjective:    Patient ID: Chelsea Griffith, female    DOB: 1956-01-16, 58 y.o.   MRN: [id number]  HPI Patient is a very pleasant 58 year old white female who presents today for complete physical exam. Her last colonoscopy was at years ago. She is due again in 2017. She has not had her flu shot yet this year but she declines it today. She is due for her tetanus vaccine. She is due for a mammogram. She has a history of total abdominal hysterectomy and therefore does not require Pap smears. Her most recent labwork as listed below: Lab on 04/09/2013  Component Date Value Range Status  . WBC 04/09/2013 4.9  4.0 - 10.5 K/uL Final  . RBC 04/09/2013 4.52  3.87 - 5.11 MIL/uL Final  . Hemoglobin 04/09/2013 13.5  12.0 - 15.0 g/dL Final  . HCT 04/09/2013 38.8  36.0 - 46.0 % Final  . MCV 04/09/2013 85.8  78.0 - 100.0 fL Final  . MCH 04/09/2013 29.9  26.0 - 34.0 pg Final  . MCHC 04/09/2013 34.8  30.0 - 36.0 g/dL Final  . RDW 04/09/2013 14.0  11.5 - 15.5 % Final  . Platelets 04/09/2013 153  150 - 400 K/uL Final  . Neutrophils Relative % 04/09/2013 66  43 - 77 % Final  . Neutro Abs 04/09/2013 3.2  1.7 - 7.7 K/uL Final  . Lymphocytes Relative 04/09/2013 26  12 - 46 % Final  . Lymphs Abs 04/09/2013 1.3  0.7 - 4.0 K/uL Final  . Monocytes Relative 04/09/2013 6  3 - 12 % Final  . Monocytes Absolute 04/09/2013 0.3  0.1 - 1.0 K/uL Final  . Eosinophils Relative 04/09/2013 2  0 - 5 % Final  . Eosinophils Absolute 04/09/2013 0.1  0.0 - 0.7 K/uL Final  . Basophils Relative 04/09/2013 0  0 - 1 % Final  . Basophils Absolute 04/09/2013 0.0  0.0 - 0.1 K/uL Final  . Smear Review 04/09/2013 Criteria for review not met   Final  . Sodium 04/09/2013 144  135 - 145 mEq/L Final  . Potassium 04/09/2013 4.5  3.5 - 5.3 mEq/L Final  . Chloride 04/09/2013 106  96 - 112 mEq/L Final  . CO2 04/09/2013 28  19 - 32 mEq/L Final  . Glucose, Bld 04/09/2013 108* 70 - 99 mg/dL Final  . BUN 04/09/2013 19  6 - 23 mg/dL Final  . Creat  04/09/2013 0.85  0.50 - 1.10 mg/dL Final  . Total Bilirubin 04/09/2013 0.3  0.2 - 1.2 mg/dL Final   ** Please note change in reference range(s). **  . Alkaline Phosphatase 04/09/2013 56  39 - 117 U/L Final  . AST 04/09/2013 17  0 - 37 U/L Final  . ALT 04/09/2013 15  0 - 35 U/L Final  . Total Protein 04/09/2013 6.3  6.0 - 8.3 g/dL Final  . Albumin 04/09/2013 4.4  3.5 - 5.2 g/dL Final  . Calcium 04/09/2013 9.6  8.4 - 10.5 mg/dL Final  . Cholesterol 04/09/2013 234* 0 - 200 mg/dL Final   Comment: ATP III Classification:                                < 200        mg/dL        Desirable  200 - 239     mg/dL        Borderline High                               >= 240        mg/dL        High                             . Triglycerides 04/09/2013 95  <150 mg/dL Final  . HDL 04/09/2013 59  >39 mg/dL Final  . Total CHOL/HDL Ratio 04/09/2013 4.0   Final  . VLDL 04/09/2013 19  0 - 40 mg/dL Final  . LDL Cholesterol 04/09/2013 156* 0 - 99 mg/dL Final   Comment:                            Total Cholesterol/HDL Ratio:CHD Risk                                                 Coronary Heart Disease Risk Table                                                                 Men       Women                                   1/2 Average Risk              3.4        3.3                                       Average Risk              5.0        4.4                                    2X Average Risk              9.6        7.1                                    3X Average Risk             23.4       11.0                          Use the calculated Patient Ratio above and the CHD Risk table  to determine the patient's CHD Risk.                          ATP III Classification (LDL):                                < 100        mg/dL         Optimal                               100 - 129     mg/dL         Near or Above Optimal                                130 - 159     mg/dL         Borderline High                               160 - 189     mg/dL         High                                > 190        mg/dL         Very High                             . TSH 04/09/2013 2.361  0.350 - 4.500 uIU/mL Final   Patient is currently taking meloxicam every day for her shoulder pain. She also then takes Aleve at night for shoulder pain. She was not aware of the potential GI side effects of doing this. Past Medical History  Diagnosis Date  . Asthma   . Hyperlipidemia   . Anxiety   . Depression    Past Surgical History  Procedure Laterality Date  . Abdominal hysterectomy     Current Outpatient Prescriptions on File Prior to Visit  Medication Sig Dispense Refill  . budesonide-formoterol (SYMBICORT) 160-4.5 MCG/ACT inhaler Inhale 2 puffs into the lungs.       No current facility-administered medications on file prior to visit.   No Known Allergies History   Social History  . Marital Status: Divorced    Spouse Name: N/A    Number of Children: N/A  . Years of Education: N/A   Occupational History  . Not on file.   Social History Main Topics  . Smoking status: Never Smoker   . Smokeless tobacco: Never Used  . Alcohol Use: 1.2 oz/week    2 Shots of liquor per week  . Drug Use: No  . Sexual Activity: Yes     Comment: single   Other Topics Concern  . Not on file   Social History Narrative  . No narrative on file   Family History  Problem Relation Age of Onset  . Hyperlipidemia Mother   . ADD / ADHD Son   . Heart disease Maternal Grandmother       Review of Systems  All other systems reviewed and are negative.  Objective:   Physical Exam  Vitals reviewed. Constitutional: She is oriented to person, place, and time. She appears well-developed and well-nourished. No distress.  HENT:  Head: Normocephalic and atraumatic.  Right Ear: External ear normal.  Left Ear: External ear normal.  Nose: Nose normal.    Mouth/Throat: Oropharynx is clear and moist. No oropharyngeal exudate.  Eyes: Conjunctivae and EOM are normal. Pupils are equal, round, and reactive to light. Right eye exhibits no discharge. Left eye exhibits no discharge. No scleral icterus.  Neck: Normal range of motion. Neck supple. No JVD present. No tracheal deviation present. No thyromegaly present.  Cardiovascular: Normal rate, regular rhythm, normal heart sounds and intact distal pulses.  Exam reveals no gallop and no friction rub.   No murmur heard. Pulmonary/Chest: Effort normal and breath sounds normal. No stridor. No respiratory distress. She has no wheezes. She has no rales. She exhibits no tenderness.  Abdominal: Soft. Bowel sounds are normal. She exhibits no distension and no mass. There is no tenderness. There is no rebound and no guarding.  Musculoskeletal: Normal range of motion. She exhibits no edema and no tenderness.  Lymphadenopathy:    She has no cervical adenopathy.  Neurological: She is alert and oriented to person, place, and time. She has normal reflexes. She displays normal reflexes. No cranial nerve deficit. She exhibits normal muscle tone. Coordination normal.  Skin: Skin is warm. No rash noted. She is not diaphoretic. No erythema. No pallor.  Psychiatric: She has a normal mood and affect. Her behavior is normal. Judgment and thought content normal.          Assessment & Plan:  1. HLD (hyperlipidemia) Patient's goal LDL is less than 130 given her age and her family history of heart disease. Start the patient on pravastatin 20 mg by mouth daily and recheck a fasting lipid panel and CMP in 3 months - pravastatin (PRAVACHOL) 20 MG tablet; Take 1 tablet (20 mg total) by mouth daily.  Dispense: 90 tablet; Refill: 3  2. Routine general medical examination at a health care facility Scheduled patient for mammogram. She has received her tetanus shot today.  She declined her flu vaccine. She prefers her breast exam and  pelvic exam but performed her gynecologist. Her colonoscopy is up to date. The remainder of her preventative care is up-to-date. We had a long discussion about low carbohydrate diet given her elevated blood sugar. Recheck labs in 3 months.  I also switched the patient from meloxicam to Celebrex 200 mg by mouth daily to hopefully help protect against ulcers.

## 2013-04-16 ENCOUNTER — Telehealth: Payer: Self-pay | Admitting: Family Medicine

## 2013-04-16 NOTE — Telephone Encounter (Signed)
Sent PA through [email or web address] 

## 2013-04-19 ENCOUNTER — Telehealth: Payer: Self-pay | Admitting: Family Medicine

## 2013-04-19 DIAGNOSIS — Z1231 Encounter for screening mammogram for malignant neoplasm of breast: Secondary | ICD-10-CM

## 2013-04-19 NOTE — Telephone Encounter (Signed)
Ordered place.

## 2013-04-19 NOTE — Telephone Encounter (Signed)
Call back number is [phone] Pt is wanting a mammogram and she will not be back in from out of town till Monday  Pt is wanting to get it done at [facility]

## 2013-04-24 ENCOUNTER — Ambulatory Visit (HOSPITAL_COMMUNITY): Payer: BC Managed Care – PPO

## 2013-04-25 NOTE — Telephone Encounter (Signed)
PA approved and pharmacy aware

## 2013-05-11 ENCOUNTER — Ambulatory Visit (HOSPITAL_COMMUNITY)
Admission: RE | Admit: 2013-05-11 | Discharge: 2013-05-11 | Disposition: A | Discharge status: Home / Self Care | Payer: BC Managed Care – PPO | Source: Ambulatory Visit | Attending: Family Medicine | Admitting: Family Medicine

## 2013-05-11 DIAGNOSIS — Z1231 Encounter for screening mammogram for malignant neoplasm of breast: Secondary | ICD-10-CM | POA: Insufficient documentation

## 2013-07-31 ENCOUNTER — Other Ambulatory Visit: Payer: BC Managed Care – PPO

## 2013-07-31 DIAGNOSIS — Z79899 Other long term (current) drug therapy: Secondary | ICD-10-CM

## 2013-07-31 DIAGNOSIS — E785 Hyperlipidemia, unspecified: Secondary | ICD-10-CM

## 2013-07-31 LAB — LIPID PANEL
CHOLESTEROL: 181 mg/dL (ref 0–200)
HDL: 58 mg/dL (ref >39)
LDL CALC: 98 mg/dL (ref 0–99)
TRIGLYCERIDES: 127 mg/dL (ref <150)
Total CHOL/HDL Ratio: 3.1 Ratio
VLDL: 25 mg/dL (ref 0–40)

## 2013-07-31 LAB — COMPLETE METABOLIC PANEL WITH GFR
ALBUMIN: 4.6 g/dL (ref 3.5–5.2)
ALT: 10 U/L (ref 0–35)
AST: 14 U/L (ref 0–37)
Alkaline Phosphatase: 57 U/L (ref 39–117)
BUN: 19 mg/dL (ref 6–23)
CALCIUM: 9.3 mg/dL (ref 8.4–10.5)
CHLORIDE: 105 meq/L (ref 96–112)
CO2: 29 meq/L (ref 19–32)
CREATININE: 0.86 mg/dL (ref 0.50–1.10)
GFR, Est African American: 86 mL/min
GFR, Est Non African American: 74 mL/min
Glucose, Bld: 104 mg/dL — ABNORMAL HIGH (ref 70–99)
Potassium: 4.4 mEq/L (ref 3.5–5.3)
Sodium: 142 mEq/L (ref 135–145)
Total Bilirubin: 0.5 mg/dL (ref 0.2–1.2)
Total Protein: 6.6 g/dL (ref 6.0–8.3)

## 2013-09-26 ENCOUNTER — Other Ambulatory Visit: Payer: Self-pay | Admitting: Obstetrics and Gynecology

## 2013-09-26 DIAGNOSIS — M810 Age-related osteoporosis without current pathological fracture: Secondary | ICD-10-CM

## 2013-10-01 ENCOUNTER — Ambulatory Visit (HOSPITAL_COMMUNITY)
Admission: RE | Admit: 2013-10-01 | Discharge: 2013-10-01 | Disposition: A | Discharge status: Home / Self Care | Payer: BC Managed Care – PPO | Source: Ambulatory Visit | Attending: Obstetrics and Gynecology | Admitting: Obstetrics and Gynecology

## 2013-10-01 DIAGNOSIS — M810 Age-related osteoporosis without current pathological fracture: Secondary | ICD-10-CM

## 2013-10-01 DIAGNOSIS — Z13820 Encounter for screening for osteoporosis: Secondary | ICD-10-CM | POA: Insufficient documentation

## 2013-10-01 DIAGNOSIS — Z780 Asymptomatic menopausal state: Secondary | ICD-10-CM | POA: Insufficient documentation

## 2013-10-08 ENCOUNTER — Ambulatory Visit (INDEPENDENT_AMBULATORY_CARE_PROVIDER_SITE_OTHER): Payer: BC Managed Care – PPO | Admitting: Family Medicine

## 2013-10-08 ENCOUNTER — Encounter: Payer: Self-pay | Admitting: Family Medicine

## 2013-10-08 VITALS — BP 110/68 | HR 78 | Temp 97.0°F | Resp 14 | Ht 63.0 in | Wt 152.0 lb

## 2013-10-08 DIAGNOSIS — R0789 Other chest pain: Secondary | ICD-10-CM

## 2013-10-08 NOTE — Progress Notes (Signed)
Subjective:    Patient ID: Chelsea Griffith, female    DOB: 05/01/1955, 58 y.o.   MRN: [id number]  HPI Patient is a very pleasant 58 year old white female who presents with one week of episodic chest pain. The pain occurs suddenly and without provocation in the left lateral side of her chest. It lasts several seconds and resolve spontaneously. There were no exacerbating or alleviating factors. There is no shortness of breath. There is no nausea. There is no sweating or diaphoresis. There is no radiation of the pain. She denies any cough or pleurisy. She denies any hemoptysis or fevers. She denies any indigestion or melena. She does have a history of laryngospasm 2 is causing a chronic cough although it stopped several years ago. She does have a family history of a maternal grandfather who had a heart attack in his 40s. Otherwise history is noncontributory. She has no history of hyperlipidemia or hypertension or smoking or diabetes. She performed caring for her horses. She does admitting her labor without chest pain. She denies any dyspnea on exertion or decreasing stamina. However EKG today reveals T wave inversions in V3 V4 V5 and V6. Compared to an EKG dated December 2013, there is some nonspecific ST segment changes in V3 V4 V5 V6 although not as pronounced as those seen today. Past Medical History  Diagnosis Date  . Asthma   . Hyperlipidemia   . Anxiety   . Depression    Past Surgical History  Procedure Laterality Date  . Abdominal hysterectomy     Current Outpatient Prescriptions on File Prior to Visit  Medication Sig Dispense Refill  . budesonide-formoterol (SYMBICORT) 160-4.5 MCG/ACT inhaler Inhale 2 puffs into the lungs.      . celecoxib (CELEBREX) 200 MG capsule Take 1 capsule (200 mg total) by mouth daily.  30 capsule  11  . clonazePAM (KLONOPIN) 1 MG tablet Take 1 mg by mouth at bedtime.       Marland Kitchen FLUoxetine (PROZAC) 40 MG capsule Take 40 mg by mouth daily.       . pravastatin  (PRAVACHOL) 20 MG tablet Take 1 tablet (20 mg total) by mouth daily.  90 tablet  3  . traZODone (DESYREL) 100 MG tablet 100 mg at bedtime.        No current facility-administered medications on file prior to visit.   No Known Allergies History   Social History  . Marital Status: Divorced    Spouse Name: N/A    Number of Children: N/A  . Years of Education: N/A   Occupational History  . Not on file.   Social History Main Topics  . Smoking status: Never Smoker   . Smokeless tobacco: Never Used  . Alcohol Use: 1.2 oz/week    2 Shots of liquor per week  . Drug Use: No  . Sexual Activity: Yes     Comment: single   Other Topics Concern  . Not on file   Social History Narrative  . No narrative on file      Review of Systems  All other systems reviewed and are negative.      Objective:   Physical Exam  Vitals reviewed. Cardiovascular: Normal rate, regular rhythm and normal heart sounds.  Exam reveals no gallop and no friction rub.   No murmur heard. Pulmonary/Chest: Effort normal and breath sounds normal. No respiratory distress. She has no wheezes. She has no rales.  Abdominal: Soft. Bowel sounds are normal. She exhibits no distension. There is  no tenderness. There is no rebound and no guarding.          Assessment & Plan:  1. Chest pain, atypical I do not believe the pain is cardiac or pulmonary in nature. Patient denies any anxiety or muscle tenderness. Therefore I believe the pain is likely GI related. I recommended the patient try Dexilant 60 mg poqday to see if her symptoms will improve. Meanwhile will schedule patient to cardiologist for a stress test given the abnormality seen on her EKG just to thorough.  If stress test is normal and pain persist even on a PPI, consider chest x-ray and/or EGD. - Ambulatory referral to Cardiology - EKG 12-Lead

## 2013-10-15 ENCOUNTER — Telehealth: Payer: Self-pay | Admitting: Family Medicine

## 2013-10-15 MED ORDER — OMEPRAZOLE 40 MG PO CPDR: 40.0000 mg | DELAYED_RELEASE_CAPSULE | Freq: Every day | ORAL | Status: DC

## 2013-10-15 NOTE — Telephone Encounter (Signed)
°  [phone]  [facility] [address]  Pt was given samples of dexilant 60 mg, And she was told that when it was time to call something in that they would call something in less expensive

## 2013-10-15 NOTE — Telephone Encounter (Signed)
Omeprazole 40 poqday

## 2013-10-15 NOTE — Telephone Encounter (Signed)
Prescription sent to pharmacy. .   Call placed to patient and patient made aware.  

## 2013-10-15 NOTE — Telephone Encounter (Signed)
MD please advise

## 2013-10-19 ENCOUNTER — Encounter: Payer: Self-pay | Admitting: Cardiovascular Disease

## 2013-10-19 ENCOUNTER — Ambulatory Visit (INDEPENDENT_AMBULATORY_CARE_PROVIDER_SITE_OTHER): Payer: BC Managed Care – PPO | Admitting: Cardiovascular Disease

## 2013-10-19 VITALS — BP 120/64 | HR 62 | Ht 63.0 in | Wt 149.0 lb

## 2013-10-19 DIAGNOSIS — K219 Gastro-esophageal reflux disease without esophagitis: Secondary | ICD-10-CM

## 2013-10-19 DIAGNOSIS — E785 Hyperlipidemia, unspecified: Secondary | ICD-10-CM

## 2013-10-19 DIAGNOSIS — R0789 Other chest pain: Secondary | ICD-10-CM

## 2013-10-19 DIAGNOSIS — R079 Chest pain, unspecified: Secondary | ICD-10-CM | POA: Insufficient documentation

## 2013-10-19 NOTE — Progress Notes (Addendum)
Patient ID: Chelsea Griffith, female   DOB: [birth year], 58 y.o.   MRN: [id number]   58 yo referred by Dr Chelsea Griffith for chest pain and abnormal ECG   Initially seen by Dr Chelsea Griffith 8/10  with one week of episodic chest pain. The pain occurs suddenly and without provocation in the left lateral side of her chest. It lasts several seconds and resolve spontaneously. There were no exacerbating or alleviating factors. There is no shortness of breath. There is no nausea. There is no sweating or diaphoresis. There is no radiation of the pain. She denies any cough or pleurisy. She denies any hemoptysis or fevers. She denies any indigestion or melena. She does have a history of laryngospasm 2 is causing a chronic cough although it stopped several years ago. She does have a family history of a maternal grandfather who had a heart attack in his 33s.  CRF elevated lipid on statin Satin recently restarted  No myalgias or muscle pain with it before Has not had any pain last 4 days  ECG from Dr Chelsea Griffith office with nonspecific ST changes new from 2014  ROS: Denies fever, malais, weight loss, blurry vision, decreased visual acuity, cough, sputum, SOB, hemoptysis, pleuritic pain, palpitaitons, heartburn, abdominal pain, melena, lower extremity edema, claudication, or rash.  All other systems reviewed and negative   General: Affect appropriate Healthy:  appears stated age 36: normal Neck supple with no adenopathy JVP normal no bruits no thyromegaly Lungs clear with no wheezing and good diaphragmatic motion Heart:  S1/S2 no murmur,rub, gallop or click PMI normal Abdomen: benighn, BS positve, no tenderness, no AAA no bruit.  No HSM or HJR Distal pulses intact with no bruits No edema Neuro non-focal Skin warm and dry No muscular weakness multiple orhtopedic scars from shoulder/elbo and right knee surgeries   Medications Current Outpatient Prescriptions  Medication Sig Dispense Refill  . budesonide-formoterol  (SYMBICORT) 160-4.5 MCG/ACT inhaler Inhale 2 puffs into the lungs.      . celecoxib (CELEBREX) 200 MG capsule Take 1 capsule (200 mg total) by mouth daily.  30 capsule  11  . celecoxib (CELEBREX) 200 MG capsule       . clonazePAM (KLONOPIN) 1 MG tablet Take 1 mg by mouth at bedtime.       Marland Kitchen FLUoxetine (PROZAC) 40 MG capsule Take 40 mg by mouth daily.       Marland Kitchen omeprazole (PRILOSEC) 40 MG capsule Take 1 capsule (40 mg total) by mouth daily.  30 capsule  3  . pravastatin (PRAVACHOL) 20 MG tablet Take 1 tablet (20 mg total) by mouth daily.  90 tablet  3  . pravastatin (PRAVACHOL) 20 MG tablet       . traZODone (DESYREL) 100 MG tablet 100 mg at bedtime.        No current facility-administered medications for this visit.    Allergies Codeine  Family History: Family History  Problem Relation Age of Onset  . Hyperlipidemia Mother   . ADD / ADHD Son   . Heart disease Maternal Grandmother     Social History: History   Social History  . Marital Status: Divorced    Spouse Name: N/A    Number of Children: N/A  . Years of Education: N/A   Occupational History  . Not on file.   Social History Main Topics  . Smoking status: Never Smoker   . Smokeless tobacco: Never Used  . Alcohol Use: 1.2 oz/week    2 Shots of liquor per  week  . Drug Use: No  . Sexual Activity: Yes     Comment: single   Other Topics Concern  . Not on file   Social History Narrative  . No narrative on file    Electrocardiogram:  NSR nonspecific ST changes in anterolateral leads  Rate 62  new since ECG 2014    Assessment and Plan

## 2013-10-19 NOTE — Assessment & Plan Note (Signed)
May be related to chest pain Continue PPI improved

## 2013-10-19 NOTE — Assessment & Plan Note (Signed)
Cholesterol is at goal.  Continue current dose of statin and diet Rx.  No myalgias or side effects.  F/U  LFT's in 6 months. Lab Results  Component Value Date   LDLCALC 98 07/31/2013

## 2013-10-19 NOTE — Assessment & Plan Note (Signed)
Atypical non specific T wave changes on ECG  F/U stress myovue

## 2013-10-19 NOTE — Patient Instructions (Signed)
Your physician recommends that you schedule a follow-up appointment in: AS NEEDED  Your physician recommends that you continue on your current medications as directed. Please refer to the Current Medication list given to you today. Your physician has requested that you have en exercise stress myoview. For further information please visit [email or web address]. Please follow instruction sheet, as given.

## 2013-10-23 ENCOUNTER — Other Ambulatory Visit: Payer: Self-pay | Admitting: *Deleted

## 2013-10-23 DIAGNOSIS — R0789 Other chest pain: Secondary | ICD-10-CM

## 2013-10-25 ENCOUNTER — Encounter (HOSPITAL_COMMUNITY): Payer: BC Managed Care – PPO

## 2013-10-29 ENCOUNTER — Encounter: Payer: Self-pay | Admitting: Family Medicine

## 2013-10-29 ENCOUNTER — Ambulatory Visit (INDEPENDENT_AMBULATORY_CARE_PROVIDER_SITE_OTHER): Payer: BC Managed Care – PPO | Admitting: Family Medicine

## 2013-10-29 DIAGNOSIS — S61411A Laceration without foreign body of right hand, initial encounter: Secondary | ICD-10-CM

## 2013-10-29 DIAGNOSIS — S61409A Unspecified open wound of unspecified hand, initial encounter: Secondary | ICD-10-CM

## 2013-10-29 NOTE — Progress Notes (Signed)
   Subjective:    Patient ID: Chelsea Griffith, female    DOB: [birth year], 58 y.o.   MRN: [id number]  HPI Patient sustained a laceration to the volar surface of her right wrist at the flexor crease over the distal ulna approximately 30 minutes ago while washing her truck. Her tetanus shot performed in 2015 and is up-to-date.  The laceration is approximately 2 cm long. It is superficial. The hand is neurovascularly intact. There is no apparent tendon damage. Past Medical History  Diagnosis Date  . Asthma   . Hyperlipidemia   . Anxiety   . Depression    Past Surgical History  Procedure Laterality Date  . Abdominal hysterectomy     Current Outpatient Prescriptions on File Prior to Visit  Medication Sig Dispense Refill  . budesonide-formoterol (SYMBICORT) 160-4.5 MCG/ACT inhaler Inhale 2 puffs into the lungs.      . celecoxib (CELEBREX) 200 MG capsule Take 1 capsule (200 mg total) by mouth daily.  30 capsule  11  . celecoxib (CELEBREX) 200 MG capsule       . clonazePAM (KLONOPIN) 1 MG tablet Take 1 mg by mouth at bedtime.       Marland Kitchen FLUoxetine (PROZAC) 40 MG capsule Take 40 mg by mouth daily.       Marland Kitchen omeprazole (PRILOSEC) 40 MG capsule Take 1 capsule (40 mg total) by mouth daily.  30 capsule  3  . pravastatin (PRAVACHOL) 20 MG tablet Take 1 tablet (20 mg total) by mouth daily.  90 tablet  3  . pravastatin (PRAVACHOL) 20 MG tablet       . traZODone (DESYREL) 100 MG tablet 100 mg at bedtime.        No current facility-administered medications on file prior to visit.   Allergies  Allergen Reactions  . Codeine Itching   History   Social History  . Marital Status: Divorced    Spouse Name: N/A    Number of Children: N/A  . Years of Education: N/A   Occupational History  . Not on file.   Social History Main Topics  . Smoking status: Never Smoker   . Smokeless tobacco: Never Used  . Alcohol Use: 1.2 oz/week    2 Shots of liquor per week  . Drug Use: No  . Sexual Activity: Yes   Comment: single   Other Topics Concern  . Not on file   Social History Narrative  . No narrative on file      Review of Systems  All other systems reviewed and are negative.      Objective:   Physical Exam  Vitals reviewed. Cardiovascular: Normal rate and regular rhythm.   Pulmonary/Chest: Effort normal and breath sounds normal.  2 cm lac as described above.        Assessment & Plan:  Hand laceration, right, initial encounter   Area was anesthetized with 0.1% lidocaine without epinephrine. It was then prepped and draped in sterile fashion. The wound edges were approximated with 2 simple 3-0 Ethilon sutures after the balloon had been cleaned thoroughly with Betadine and normal saline. Patient tolerated the procedure well without complications. Stitches out in 7 days

## 2013-11-08 ENCOUNTER — Other Ambulatory Visit (HOSPITAL_COMMUNITY): Payer: Self-pay | Admitting: *Deleted

## 2013-11-09 ENCOUNTER — Ambulatory Visit (HOSPITAL_COMMUNITY)
Admission: RE | Admit: 2013-11-09 | Discharge: 2013-11-09 | Disposition: A | Discharge status: Home / Self Care | Payer: BC Managed Care – PPO | Source: Ambulatory Visit | Attending: Obstetrics and Gynecology | Admitting: Obstetrics and Gynecology

## 2013-11-09 DIAGNOSIS — M810 Age-related osteoporosis without current pathological fracture: Secondary | ICD-10-CM | POA: Insufficient documentation

## 2013-11-09 MED ORDER — DENOSUMAB 60 MG/ML ~~LOC~~ SOLN
60.0000 mg | Freq: Once | SUBCUTANEOUS | Status: AC
  Administered 2013-11-09: 60 mg via SUBCUTANEOUS
  Filled 2013-11-09: qty 1

## 2013-11-09 NOTE — Discharge Instructions (Signed)
Denosumab injection What is this medicine? DENOSUMAB (den oh sue mab) slows bone breakdown. Prolia is used to treat osteoporosis in women after menopause and in men. Xgeva is used to prevent bone fractures and other bone problems caused by cancer bone metastases. Xgeva is also used to treat giant cell tumor of the bone. This medicine may be used for other purposes; ask your health care provider or pharmacist if you have questions. COMMON BRAND NAME(S): Prolia, XGEVA What should I tell my health care provider before I take this medicine? They need to know if you have any of these conditions: -dental disease -eczema -infection or history of infections -kidney disease or on dialysis -low blood calcium or vitamin D -malabsorption syndrome -scheduled to have surgery or tooth extraction -taking medicine that contains denosumab -thyroid or parathyroid disease -an unusual reaction to denosumab, other medicines, foods, dyes, or preservatives -pregnant or trying to get pregnant -breast-feeding How should I use this medicine? This medicine is for injection under the skin. It is given by a health care professional in a hospital or clinic setting. If you are getting Prolia, a special MedGuide will be given to you by the pharmacist with each prescription and refill. Be sure to read this information carefully each time. For Prolia, talk to your pediatrician regarding the use of this medicine in children. Special care may be needed. For Xgeva, talk to your pediatrician regarding the use of this medicine in children. While this drug may be prescribed for children as young as 13 years for selected conditions, precautions do apply. Overdosage: If you think you've taken too much of this medicine contact a poison control center or emergency room at once. Overdosage: If you think you have taken too much of this medicine contact a poison control center or emergency room at once. NOTE: This medicine is only for  you. Do not share this medicine with others. What if I miss a dose? It is important not to miss your dose. Call your doctor or health care professional if you are unable to keep an appointment. What may interact with this medicine? Do not take this medicine with any of the following medications: -other medicines containing denosumab This medicine may also interact with the following medications: -medicines that suppress the immune system -medicines that treat cancer -steroid medicines like prednisone or cortisone This list may not describe all possible interactions. Give your health care provider a list of all the medicines, herbs, non-prescription drugs, or dietary supplements you use. Also tell them if you smoke, drink alcohol, or use illegal drugs. Some items may interact with your medicine. What should I watch for while using this medicine? Visit your doctor or health care professional for regular checks on your progress. Your doctor or health care professional may order blood tests and other tests to see how you are doing. Call your doctor or health care professional if you get a cold or other infection while receiving this medicine. Do not treat yourself. This medicine may decrease your body's ability to fight infection. You should make sure you get enough calcium and vitamin D while you are taking this medicine, unless your doctor tells you not to. Discuss the foods you eat and the vitamins you take with your health care professional. See your dentist regularly. Brush and floss your teeth as directed. Before you have any dental work done, tell your dentist you are receiving this medicine. Do not become pregnant while taking this medicine or for 5 months after stopping   it. Women should inform their doctor if they wish to become pregnant or think they might be pregnant. There is a potential for serious side effects to an unborn child. Talk to your health care professional or pharmacist for more  information. What side effects may I notice from receiving this medicine? Side effects that you should report to your doctor or health care professional as soon as possible: -allergic reactions like skin rash, itching or hives, swelling of the face, lips, or tongue -breathing problems -chest pain -fast, irregular heartbeat -feeling faint or lightheaded, falls -fever, chills, or any other sign of infection -muscle spasms, tightening, or twitches -numbness or tingling -skin blisters or bumps, or is dry, peels, or red -slow healing or unexplained pain in the mouth or jaw -unusual bleeding or bruising Side effects that usually do not require medical attention (Report these to your doctor or health care professional if they continue or are bothersome.): -muscle pain -stomach upset, gas This list may not describe all possible side effects. Call your doctor for medical advice about side effects. You may report side effects to FDA at 1-800-FDA-1088. Where should I keep my medicine? This medicine is only given in a clinic, doctor's office, or other health care setting and will not be stored at home. NOTE: This sheet is a summary. It may not cover all possible information. If you have questions about this medicine, talk to your doctor, pharmacist, or health care provider.  2015, Elsevier/Gold Standard. (2011-08-23 12:37:47)  

## 2013-11-21 ENCOUNTER — Ambulatory Visit (HOSPITAL_COMMUNITY): Payer: BC Managed Care – PPO | Attending: Internal Medicine | Admitting: Cardiology

## 2013-11-21 ENCOUNTER — Ambulatory Visit (INDEPENDENT_AMBULATORY_CARE_PROVIDER_SITE_OTHER): Payer: BC Managed Care – PPO | Admitting: Physician Assistant

## 2013-11-21 DIAGNOSIS — R079 Chest pain, unspecified: Secondary | ICD-10-CM

## 2013-11-21 DIAGNOSIS — R0789 Other chest pain: Secondary | ICD-10-CM

## 2013-11-21 DIAGNOSIS — E785 Hyperlipidemia, unspecified: Secondary | ICD-10-CM | POA: Insufficient documentation

## 2013-11-21 DIAGNOSIS — R072 Precordial pain: Secondary | ICD-10-CM | POA: Diagnosis not present

## 2013-11-21 NOTE — Progress Notes (Signed)
Echo performed. 

## 2013-11-21 NOTE — Progress Notes (Signed)
Exercise Treadmill Test  Pre-Exercise Testing Evaluation Rhythm: normal sinus  Rate: 63 bpm     Test  Exercise Tolerance Test Ordering MD: Jenkins Rouge, MD  Interpreting MD: Richardson Dopp, PA-C  Unique Test No: 1  Treadmill:  1  Indication for ETT: chest pain - rule out ischemia  Contraindication to ETT: No   Stress Modality: exercise - treadmill  Cardiac Imaging Performed: non   Protocol: standard Bruce - maximal  Max BP:  157/75  Max MPHR (bpm):  161 85% MPR (bpm):  137  MPHR obtained (bpm):  139 % MPHR obtained:  86  Reached 85% MPHR (min:sec):  7:27 Total Exercise Time (min-sec):  8:00  Workload in METS:  10.0 Borg Scale: 17  Reason ETT Terminated:  patient's desire to stop    ST Segment Analysis At Rest: normal ST segments - no evidence of significant ST depression With Exercise: no evidence of significant ST depression  Other Information Arrhythmia:  No Angina during ETT:  absent (0) Quality of ETT:  diagnostic  ETT Interpretation:  normal - no evidence of ischemia by ST analysis  Comments: Good exercise capacity. No chest pain. Normal BP response to exercise. No ST changes to suggest ischemia.   Recommendations: FU with Dr. Jenkins Rouge as directed. Signed,  Richardson Dopp, PA-C   11/21/2013 11:04 AM

## 2013-11-26 ENCOUNTER — Telehealth: Payer: Self-pay | Admitting: Cardiovascular Disease

## 2013-11-26 NOTE — Telephone Encounter (Signed)
OLD  MESSAGE PT  AWARE OF ECHO RESULTS./CY

## 2013-11-26 NOTE — Telephone Encounter (Signed)
New message  ° ° °Patient calling for test results.   °

## 2014-02-07 ENCOUNTER — Other Ambulatory Visit: Payer: Self-pay | Admitting: Family Medicine

## 2014-02-07 NOTE — Telephone Encounter (Signed)
Refill appropriate and filled per protocol. 

## 2014-02-22 ENCOUNTER — Other Ambulatory Visit: Payer: Self-pay | Admitting: Family Medicine

## 2014-03-26 ENCOUNTER — Telehealth: Payer: Self-pay | Admitting: Family Medicine

## 2014-03-26 NOTE — Telephone Encounter (Signed)
[phone]  PT has a CPE on 2/12 and she is wanting to have her prolia injection done then, she had her first one through her gynecologist and she said she had to go to the hospital to get it and it was a big deal

## 2014-04-04 ENCOUNTER — Encounter: Payer: Self-pay | Admitting: Family Medicine

## 2014-04-04 ENCOUNTER — Ambulatory Visit (INDEPENDENT_AMBULATORY_CARE_PROVIDER_SITE_OTHER): Payer: BLUE CROSS/BLUE SHIELD | Admitting: Family Medicine

## 2014-04-04 VITALS — BP 122/68 | HR 68 | Temp 98.4°F | Resp 18 | Ht 63.0 in | Wt 158.0 lb

## 2014-04-04 DIAGNOSIS — J208 Acute bronchitis due to other specified organisms: Secondary | ICD-10-CM

## 2014-04-04 MED ORDER — ALBUTEROL SULFATE HFA 108 (90 BASE) MCG/ACT IN AERS: 2.0000 | INHALATION_SPRAY | Freq: Four times a day (QID) | RESPIRATORY_TRACT | Status: DC | PRN

## 2014-04-04 MED ORDER — HYDROCOD POLST-CHLORPHEN POLST 10-8 MG/5ML PO LQCR: 5.0000 mL | Freq: Two times a day (BID) | ORAL | Status: DC | PRN

## 2014-04-04 NOTE — Progress Notes (Signed)
Subjective:    Patient ID: Chelsea Griffith, female    DOB: October 06, 1955, 59 y.o.   MRN: [id number]  HPI Patient is a very pleasant 59 year old white female with a history of asthma and reactive airway disease. Over the last 2 days she's gotten a terrible cough. She denies any fever or subjective chills. She does report chest congestion and generally feeling sick. She also reports head congestion. She denies any sore throat or rhinorrhea. She denies any myalgias or right upper quadrant pain. On examination today she is frequently coughing. She does have some faint expiratory wheezes. She has a slightly prolonged is respiratory expiratory ratio. There is no crackles Rales. Past Medical History  Diagnosis Date  . Asthma   . Hyperlipidemia   . Anxiety   . Depression    Past Surgical History  Procedure Laterality Date  . Abdominal hysterectomy     Current Outpatient Prescriptions on File Prior to Visit  Medication Sig Dispense Refill  . celecoxib (CELEBREX) 200 MG capsule Take 1 capsule (200 mg total) by mouth daily. 30 capsule 11  . clonazePAM (KLONOPIN) 1 MG tablet Take 1 mg by mouth at bedtime.     Marland Kitchen FLUoxetine (PROZAC) 40 MG capsule Take 40 mg by mouth daily.     Marland Kitchen omeprazole (PRILOSEC) 40 MG capsule TAKE 1 CAPSULE (40 MG TOTAL) BY MOUTH DAILY. 30 capsule 11  . pravastatin (PRAVACHOL) 20 MG tablet Take 1 tablet (20 mg total) by mouth daily. 90 tablet 3  . traZODone (DESYREL) 100 MG tablet 100 mg at bedtime.      No current facility-administered medications on file prior to visit.   Allergies  Allergen Reactions  . Codeine Itching   History   Social History  . Marital Status: Divorced    Spouse Name: N/A    Number of Children: N/A  . Years of Education: N/A   Occupational History  . Not on file.   Social History Main Topics  . Smoking status: Never Smoker   . Smokeless tobacco: Never Used  . Alcohol Use: 1.2 oz/week    2 Shots of liquor per week  . Drug Use: No  . Sexual  Activity: Yes     Comment: single   Other Topics Concern  . Not on file   Social History Narrative      Review of Systems  All other systems reviewed and are negative.      Objective:   Physical Exam  Constitutional: She appears well-developed and well-nourished. No distress.  HENT:  Right Ear: External ear normal.  Left Ear: External ear normal.  Nose: Nose normal.  Mouth/Throat: Oropharynx is clear and moist. No oropharyngeal exudate.  Eyes: Conjunctivae are normal.  Neck: Neck supple.  Cardiovascular: Normal rate, regular rhythm and normal heart sounds.   No murmur heard. Pulmonary/Chest: Effort normal. She has wheezes. She has no rales.  Abdominal: Soft. Bowel sounds are normal.  Lymphadenopathy:    She has no cervical adenopathy.  Skin: She is not diaphoretic.  Vitals reviewed.         Assessment & Plan:  Viral bronchitis - Plan: chlorpheniramine-HYDROcodone (TUSSIONEX PENNKINETIC ER) 10-8 MG/5ML LQCR, albuterol (PROVENTIL HFA;VENTOLIN HFA) 108 (90 BASE) MCG/ACT inhaler  Patient does have a harsh cough today and some extra auditory wheezes. I believe she has bronchitis. However given the absence of fever, and associated rhinorrhea and congestion I believe this most likely viral in nature. I have recommended tincture of time. I did give  the patient Tussionex 1 teaspoon every 12 hours as needed for cough. Also refilled her albuterol in case the wheezing worsens..  Recheck in one week if no better or immediately if worse

## 2014-04-08 ENCOUNTER — Other Ambulatory Visit: Payer: Self-pay | Admitting: Family Medicine

## 2014-04-08 ENCOUNTER — Encounter: Payer: Self-pay | Admitting: Family Medicine

## 2014-04-08 DIAGNOSIS — M810 Age-related osteoporosis without current pathological fracture: Secondary | ICD-10-CM | POA: Insufficient documentation

## 2014-04-09 NOTE — Telephone Encounter (Signed)
Have tried to reach Prolia with no response.  Have sent new patient registration to them now with note that she has been getting elsewhere

## 2014-04-16 ENCOUNTER — Other Ambulatory Visit: Payer: Self-pay | Admitting: Family Medicine

## 2014-04-17 ENCOUNTER — Other Ambulatory Visit: Payer: BLUE CROSS/BLUE SHIELD

## 2014-04-17 DIAGNOSIS — Z79899 Other long term (current) drug therapy: Secondary | ICD-10-CM

## 2014-04-17 DIAGNOSIS — K219 Gastro-esophageal reflux disease without esophagitis: Secondary | ICD-10-CM

## 2014-04-17 DIAGNOSIS — Z0000 Encounter for general adult medical examination without abnormal findings: Secondary | ICD-10-CM

## 2014-04-17 LAB — CBC WITH DIFFERENTIAL/PLATELET
Basophils Absolute: 0 10*3/uL (ref 0.0–0.1)
Basophils Relative: 0 % (ref 0–1)
EOS PCT: 1 % (ref 0–5)
Eosinophils Absolute: 0.1 10*3/uL (ref 0.0–0.7)
HCT: 40.9 % (ref 36.0–46.0)
Hemoglobin: 14.1 g/dL (ref 12.0–15.0)
LYMPHS PCT: 21 % (ref 12–46)
Lymphs Abs: 1.6 10*3/uL (ref 0.7–4.0)
MCH: 29.3 pg (ref 26.0–34.0)
MCHC: 34.5 g/dL (ref 30.0–36.0)
MCV: 84.9 fL (ref 78.0–100.0)
MPV: 9 fL (ref 8.6–12.4)
Monocytes Absolute: 0.4 10*3/uL (ref 0.1–1.0)
Monocytes Relative: 5 % (ref 3–12)
NEUTROS ABS: 5.5 10*3/uL (ref 1.7–7.7)
NEUTROS PCT: 73 % (ref 43–77)
PLATELETS: 173 10*3/uL (ref 150–400)
RBC: 4.82 MIL/uL (ref 3.87–5.11)
RDW: 13.9 % (ref 11.5–15.5)
WBC: 7.5 10*3/uL (ref 4.0–10.5)

## 2014-04-17 LAB — COMPLETE METABOLIC PANEL WITH GFR
ALBUMIN: 4.3 g/dL (ref 3.5–5.2)
ALT: 18 U/L (ref 0–35)
AST: 22 U/L (ref 0–37)
Alkaline Phosphatase: 61 U/L (ref 39–117)
BILIRUBIN TOTAL: 0.5 mg/dL (ref 0.2–1.2)
BUN: 17 mg/dL (ref 6–23)
CO2: 26 mEq/L (ref 19–32)
CREATININE: 0.93 mg/dL (ref 0.50–1.10)
Calcium: 9.7 mg/dL (ref 8.4–10.5)
Chloride: 102 mEq/L (ref 96–112)
GFR, EST AFRICAN AMERICAN: 78 mL/min
GFR, EST NON AFRICAN AMERICAN: 67 mL/min
GLUCOSE: 107 mg/dL — AB (ref 70–99)
Potassium: 4.2 mEq/L (ref 3.5–5.3)
Sodium: 139 mEq/L (ref 135–145)
Total Protein: 6.7 g/dL (ref 6.0–8.3)

## 2014-04-17 LAB — LIPID PANEL
CHOLESTEROL: 196 mg/dL (ref 0–200)
HDL: 49 mg/dL (ref >39)
LDL Cholesterol: 108 mg/dL — ABNORMAL HIGH (ref 0–99)
Total CHOL/HDL Ratio: 4 Ratio
Triglycerides: 193 mg/dL — ABNORMAL HIGH (ref <150)
VLDL: 39 mg/dL (ref 0–40)

## 2014-04-19 ENCOUNTER — Encounter: Payer: Self-pay | Admitting: Family Medicine

## 2014-04-19 ENCOUNTER — Ambulatory Visit (INDEPENDENT_AMBULATORY_CARE_PROVIDER_SITE_OTHER): Payer: BLUE CROSS/BLUE SHIELD | Admitting: Family Medicine

## 2014-04-19 VITALS — BP 112/66 | HR 76 | Temp 98.5°F | Resp 18 | Ht 63.0 in | Wt 158.0 lb

## 2014-04-19 DIAGNOSIS — J4531 Mild persistent asthma with (acute) exacerbation: Secondary | ICD-10-CM

## 2014-04-19 DIAGNOSIS — Z0000 Encounter for general adult medical examination without abnormal findings: Secondary | ICD-10-CM

## 2014-04-19 MED ORDER — BECLOMETHASONE DIPROPIONATE 80 MCG/ACT IN AERS: 2.0000 | INHALATION_SPRAY | Freq: Two times a day (BID) | RESPIRATORY_TRACT | Status: DC

## 2014-04-19 NOTE — Progress Notes (Signed)
 Subjective:    Patient ID: Chelsea Griffith, female    DOB: 01/21/1956, 58 y.o.   MRN: [id number]  HPI Patient is here today for complete physical exam. Please see my last office visit. The patient wound up having to start prednisone and antibiotics for reactive airway disease/bronchitis. Chelsea Griffith does have underlying asthma. Patient states that Chelsea Griffith needs prednisone at least one or 2 times a year due to asthma and bronchospasm. Chelsea Griffith also requires her albuterol more than 2 times a week. It sounds like her asthma is not adequately controlled just using albuterol on an as needed basis. Chelsea Griffith would be interested in taking a daily inhaled steroid to help prevent asthma exacerbations. Her mammogram is up-to-date. Chelsea Griffith is due for colonoscopy. Chelsea Griffith has a history of a hysterectomy but still sees a gynecologist for pelvic and Pap exam. Her most recent lab work as listed below: Lab on 04/17/2014  Component Date Value Ref Range Status  . Cholesterol 04/17/2014 196  0 - 200 mg/dL Final   Comment: ATP III Classification:       < 200        mg/dL        Desirable      200 - 239     mg/dL        Borderline High      >= 240        mg/dL        High     . Triglycerides 04/17/2014 193* <150 mg/dL Final  . HDL 04/17/2014 49  >39 mg/dL Final  . Total CHOL/HDL Ratio 04/17/2014 4.0   Final  . VLDL 04/17/2014 39  0 - 40 mg/dL Final  . LDL Cholesterol 04/17/2014 108* 0 - 99 mg/dL Final   Comment:   Total Cholesterol/HDL Ratio:CHD Risk                        Coronary Heart Disease Risk Table                                        Men       Women          1/2 Average Risk              3.4        3.3              Average Risk              5.0        4.4           2X Average Risk              9.6        7.1           3X Average Risk             23.4       11.0 Use the calculated Patient Ratio above and the CHD Risk table  to determine the patient's CHD Risk. ATP III Classification (LDL):       < 100        mg/dL          Optimal      100 - 129     mg/dL         Near or Above Optimal        130 - 159     mg/dL         Borderline High      160 - 189     mg/dL         High       > 190        mg/dL         Very High     . WBC 04/17/2014 7.5  4.0 - 10.5 K/uL Final  . RBC 04/17/2014 4.82  3.87 - 5.11 MIL/uL Final  . Hemoglobin 04/17/2014 14.1  12.0 - 15.0 g/dL Final  . HCT 04/17/2014 40.9  36.0 - 46.0 % Final  . MCV 04/17/2014 84.9  78.0 - 100.0 fL Final  . MCH 04/17/2014 29.3  26.0 - 34.0 pg Final  . MCHC 04/17/2014 34.5  30.0 - 36.0 g/dL Final  . RDW 04/17/2014 13.9  11.5 - 15.5 % Final  . Platelets 04/17/2014 173  150 - 400 K/uL Final  . MPV 04/17/2014 9.0  8.6 - 12.4 fL Final  . Neutrophils Relative % 04/17/2014 73  43 - 77 % Final  . Neutro Abs 04/17/2014 5.5  1.7 - 7.7 K/uL Final  . Lymphocytes Relative 04/17/2014 21  12 - 46 % Final  . Lymphs Abs 04/17/2014 1.6  0.7 - 4.0 K/uL Final  . Monocytes Relative 04/17/2014 5  3 - 12 % Final  . Monocytes Absolute 04/17/2014 0.4  0.1 - 1.0 K/uL Final  . Eosinophils Relative 04/17/2014 1  0 - 5 % Final  . Eosinophils Absolute 04/17/2014 0.1  0.0 - 0.7 K/uL Final  . Basophils Relative 04/17/2014 0  0 - 1 % Final  . Basophils Absolute 04/17/2014 0.0  0.0 - 0.1 K/uL Final  . Smear Review 04/17/2014 Criteria for review not met   Final  . Sodium 04/17/2014 139  135 - 145 mEq/L Final  . Potassium 04/17/2014 4.2  3.5 - 5.3 mEq/L Final  . Chloride 04/17/2014 102  96 - 112 mEq/L Final  . CO2 04/17/2014 26  19 - 32 mEq/L Final  . Glucose, Bld 04/17/2014 107* 70 - 99 mg/dL Final  . BUN 04/17/2014 17  6 - 23 mg/dL Final  . Creat 04/17/2014 0.93  0.50 - 1.10 mg/dL Final  . Total Bilirubin 04/17/2014 0.5  0.2 - 1.2 mg/dL Final  . Alkaline Phosphatase 04/17/2014 61  39 - 117 U/L Final  . AST 04/17/2014 22  0 - 37 U/L Final  . ALT 04/17/2014 18  0 - 35 U/L Final  . Total Protein 04/17/2014 6.7  6.0 - 8.3 g/dL Final  . Albumin 04/17/2014 4.3  3.5 - 5.2 g/dL Final  .  Calcium 04/17/2014 9.7  8.4 - 10.5 mg/dL Final  . GFR, Est African American 04/17/2014 78   Final  . GFR, Est Non African American 04/17/2014 67   Final   Comment:   The estimated GFR is a calculation valid for adults (>=32 years old) that uses the CKD-EPI algorithm to adjust for age and sex. It is   not to be used for children, pregnant women, hospitalized patients,    patients on dialysis, or with rapidly changing kidney function. According to the NKDEP, eGFR >89 is normal, 60-89 shows mild impairment, 30-59 shows moderate impairment, 15-29 shows severe impairment and <15 is ESRD.       Past Medical History  Diagnosis Date  . Asthma   . Hyperlipidemia   . Anxiety   . Depression  Past Surgical History  Procedure Laterality Date  . Abdominal hysterectomy     Current Outpatient Prescriptions on File Prior to Visit  Medication Sig Dispense Refill  . albuterol (PROVENTIL HFA;VENTOLIN HFA) 108 (90 BASE) MCG/ACT inhaler Inhale 2 puffs into the lungs every 6 (six) hours as needed for wheezing or shortness of breath. 1 Inhaler 0  . celecoxib (CELEBREX) 200 MG capsule TAKE 1 CAPSULE (200 MG TOTAL) BY MOUTH DAILY. 30 capsule 9  . chlorpheniramine-HYDROcodone (TUSSIONEX PENNKINETIC ER) 10-8 MG/5ML LQCR Take 5 mLs by mouth every 12 (twelve) hours as needed for cough. 115 mL 0  . clonazePAM (KLONOPIN) 1 MG tablet Take 1 mg by mouth at bedtime.     . FLUoxetine (PROZAC) 40 MG capsule Take 40 mg by mouth daily.     . omeprazole (PRILOSEC) 40 MG capsule TAKE 1 CAPSULE (40 MG TOTAL) BY MOUTH DAILY. 30 capsule 11  . pravastatin (PRAVACHOL) 20 MG tablet Take 1 tablet (20 mg total) by mouth daily. 90 tablet 3  . traZODone (DESYREL) 100 MG tablet 100 mg at bedtime.      No current facility-administered medications on file prior to visit.   Allergies  Allergen Reactions  . Codeine Itching   History   Social History  . Marital Status: Divorced    Spouse Name: N/A  . Number of Children:  N/A  . Years of Education: N/A   Occupational History  . Not on file.   Social History Main Topics  . Smoking status: Never Smoker   . Smokeless tobacco: Never Used  . Alcohol Use: 1.2 oz/week    2 Shots of liquor per week  . Drug Use: No  . Sexual Activity: Yes     Comment: single   Other Topics Concern  . Not on file   Social History Narrative   Family History  Problem Relation Age of Onset  . Hyperlipidemia Mother   . ADD / ADHD Son   . Heart disease Maternal Grandmother       Review of Systems  All other systems reviewed and are negative.      Objective:   Physical Exam  Constitutional: Chelsea Griffith is oriented to person, place, and time. Chelsea Griffith appears well-developed and well-nourished. No distress.  HENT:  Head: Normocephalic and atraumatic.  Right Ear: External ear normal.  Left Ear: External ear normal.  Nose: Nose normal.  Mouth/Throat: Oropharynx is clear and moist. No oropharyngeal exudate.  Eyes: Conjunctivae and EOM are normal. Pupils are equal, round, and reactive to light. Right eye exhibits no discharge. Left eye exhibits no discharge.  Neck: Normal range of motion. Neck supple. No JVD present. No tracheal deviation present. No thyromegaly present.  Cardiovascular: Normal rate, regular rhythm, normal heart sounds and intact distal pulses.  Exam reveals no gallop and no friction rub.   No murmur heard. Pulmonary/Chest: Effort normal and breath sounds normal. No stridor. No respiratory distress. Chelsea Griffith has no wheezes. Chelsea Griffith has no rales. Chelsea Griffith exhibits no tenderness.  Abdominal: Soft. Bowel sounds are normal. Chelsea Griffith exhibits no distension and no mass. There is no tenderness. There is no rebound and no guarding.  Musculoskeletal: Normal range of motion. Chelsea Griffith exhibits no edema or tenderness.  Lymphadenopathy:    Chelsea Griffith has no cervical adenopathy.  Neurological: Chelsea Griffith is alert and oriented to person, place, and time. Chelsea Griffith has normal reflexes. No cranial nerve deficit. Coordination  normal.  Skin: Skin is warm. No rash noted. Chelsea Griffith is not diaphoretic. No erythema. No pallor.    Psychiatric: Chelsea Griffith has a normal mood and affect. Her behavior is normal. Judgment and thought content normal.  Vitals reviewed.         Assessment & Plan:  Routine general medical examination at a health care facility  Asthma with acute exacerbation, mild persistent - Plan: beclomethasone (QVAR) 80 MCG/ACT inhaler  Physical exam is normal. I did counsel the patient regarding her elevated fasting blood sugar. I recommended decreasing the carbohydrates in her diet and increasing aerobic exercise. Otherwise cholesterol is acceptable. Blood pressures acceptable. Cancer screening is up-to-date. I did give the patient a flu shot today. I will schedule her for colonoscopy. I also started the patient on Qvar 80 g per puff, 2 puffs inhaled twice a day for mild persistent asthma. 

## 2014-04-25 NOTE — Telephone Encounter (Signed)
Everything has been transferred for pt to receive Prolia here.  Her injection has been ordered and pt aware can come get as nurse visit at her convenience

## 2014-04-29 ENCOUNTER — Ambulatory Visit (INDEPENDENT_AMBULATORY_CARE_PROVIDER_SITE_OTHER): Payer: BLUE CROSS/BLUE SHIELD | Admitting: *Deleted

## 2014-04-29 ENCOUNTER — Encounter: Payer: Self-pay | Admitting: Internal Medicine

## 2014-04-29 DIAGNOSIS — M810 Age-related osteoporosis without current pathological fracture: Secondary | ICD-10-CM

## 2014-04-29 MED ORDER — DENOSUMAB 60 MG/ML ~~LOC~~ SOLN
60.0000 mg | Freq: Once | SUBCUTANEOUS | Status: AC
  Administered 2014-04-29: 60 mg via SUBCUTANEOUS

## 2014-05-21 ENCOUNTER — Encounter: Payer: Self-pay | Admitting: Internal Medicine

## 2014-06-15 ENCOUNTER — Other Ambulatory Visit: Payer: Self-pay | Admitting: Family Medicine

## 2014-06-26 ENCOUNTER — Encounter: Payer: Self-pay | Admitting: Internal Medicine

## 2014-07-09 ENCOUNTER — Ambulatory Visit (INDEPENDENT_AMBULATORY_CARE_PROVIDER_SITE_OTHER): Payer: BLUE CROSS/BLUE SHIELD | Admitting: Family Medicine

## 2014-07-09 ENCOUNTER — Encounter: Payer: Self-pay | Admitting: Family Medicine

## 2014-07-09 VITALS — BP 100/68 | HR 68 | Temp 98.4°F | Resp 16 | Ht 63.0 in | Wt 156.0 lb

## 2014-07-09 DIAGNOSIS — L259 Unspecified contact dermatitis, unspecified cause: Secondary | ICD-10-CM

## 2014-07-09 MED ORDER — PREDNISONE 20 MG PO TABS: ORAL_TABLET | ORAL | Status: DC

## 2014-07-09 NOTE — Progress Notes (Signed)
   Subjective:    Patient ID: Chelsea Griffith, female    DOB: 07/23/1955, 59 y.o.   MRN: [id number]  HPI Patient developed a rash on Saturday after working in her yard.  She has a diffuse rash on both forearms to consist of erythematous papules in clusters all over her ventral forearms. The rash is spread to her lower abdomen and her legs. It is extremely itching. She is tried over-the-counter cortisone and calamine without benefit. Her boyfriend has no rash. Her son has no rash. Past Medical History  Diagnosis Date  . Asthma   . Hyperlipidemia   . Anxiety   . Depression    Past Surgical History  Procedure Laterality Date  . Abdominal hysterectomy     Current Outpatient Prescriptions on File Prior to Visit  Medication Sig Dispense Refill  . albuterol (PROVENTIL HFA;VENTOLIN HFA) 108 (90 BASE) MCG/ACT inhaler Inhale 2 puffs into the lungs every 6 (six) hours as needed for wheezing or shortness of breath. 1 Inhaler 0  . beclomethasone (QVAR) 80 MCG/ACT inhaler Inhale 2 puffs into the lungs 2 (two) times daily. 1 Inhaler 12  . celecoxib (CELEBREX) 200 MG capsule TAKE 1 CAPSULE (200 MG TOTAL) BY MOUTH DAILY. 30 capsule 9  . clonazePAM (KLONOPIN) 1 MG tablet Take 1 mg by mouth at bedtime.     Marland Kitchen FLUoxetine (PROZAC) 40 MG capsule Take 40 mg by mouth daily.     Marland Kitchen omeprazole (PRILOSEC) 40 MG capsule TAKE 1 CAPSULE (40 MG TOTAL) BY MOUTH DAILY. 30 capsule 11  . pravastatin (PRAVACHOL) 20 MG tablet TAKE 1 TABLET (20 MG TOTAL) BY MOUTH DAILY. 90 tablet 3  . traZODone (DESYREL) 100 MG tablet 100 mg at bedtime.      No current facility-administered medications on file prior to visit.   Allergies  Allergen Reactions  . Codeine Itching   History   Social History  . Marital Status: Divorced    Spouse Name: N/A  . Number of Children: N/A  . Years of Education: N/A   Occupational History  . Not on file.   Social History Main Topics  . Smoking status: Never Smoker   . Smokeless tobacco:  Never Used  . Alcohol Use: 1.2 oz/week    2 Shots of liquor per week  . Drug Use: No  . Sexual Activity: Yes     Comment: single   Other Topics Concern  . Not on file   Social History Narrative      Review of Systems  All other systems reviewed and are negative.      Objective:   Physical Exam  Cardiovascular: Normal rate, regular rhythm and normal heart sounds.   Pulmonary/Chest: Effort normal and breath sounds normal.  Skin: Rash noted. There is erythema.  Vitals reviewed.         Assessment & Plan:  Contact dermatitis - Plan: predniSONE (DELTASONE) 20 MG tablet, DISCONTINUED: predniSONE (DELTASONE) 20 MG tablet  Patient has a contact dermatitis versus numerous insect bites. I recommended a prednisone taper pack for most likely contact dermatitis. If no better consider elimite for possible scabies.

## 2014-07-15 ENCOUNTER — Telehealth: Payer: Self-pay | Admitting: Family Medicine

## 2014-07-15 MED ORDER — TRIAMCINOLONE ACETONIDE 0.1 % EX CREA: 1.0000 "application " | TOPICAL_CREAM | Freq: Two times a day (BID) | CUTANEOUS | Status: DC

## 2014-07-15 NOTE — Telephone Encounter (Signed)
Pt states that she is 96% better just a few sores with lots of itching. Per [facility] can do TAC cream and if no improvement then NTBS. Med sent to pharm

## 2014-07-15 NOTE — Telephone Encounter (Signed)
If no better, I would biopsy the sores unless there are signs of spread to her close contacts (i.e.scabies)

## 2014-07-15 NOTE — Telephone Encounter (Signed)
[phone]  Pt is wanting to speak to you because she has finished her prednisone but she still has a few sores that are itching and she is wanting to know what she needs to do.

## 2014-07-16 ENCOUNTER — Ambulatory Visit (AMBULATORY_SURGERY_CENTER): Payer: Self-pay

## 2014-07-16 ENCOUNTER — Ambulatory Visit (INDEPENDENT_AMBULATORY_CARE_PROVIDER_SITE_OTHER): Payer: BLUE CROSS/BLUE SHIELD | Admitting: Family Medicine

## 2014-07-16 ENCOUNTER — Encounter: Payer: Self-pay | Admitting: Family Medicine

## 2014-07-16 VITALS — BP 108/64 | HR 64 | Temp 98.2°F | Resp 16 | Ht 63.0 in | Wt 158.0 lb

## 2014-07-16 VITALS — Ht 63.0 in | Wt 157.6 lb

## 2014-07-16 DIAGNOSIS — L309 Dermatitis, unspecified: Secondary | ICD-10-CM

## 2014-07-16 DIAGNOSIS — Z1211 Encounter for screening for malignant neoplasm of colon: Secondary | ICD-10-CM

## 2014-07-16 MED ORDER — PERMETHRIN 5 % EX CREA: 1.0000 "application " | TOPICAL_CREAM | Freq: Once | CUTANEOUS | Status: DC

## 2014-07-16 NOTE — Progress Notes (Signed)
Per pt, no allergies to soy or egg products.Pt not taking any weight loss meds or using  O2 at home.  Pt came into the office today for her pre-visit.Pt states she had a colonoscopy done over 10 years ago at [facility] and states it was normal.Unable to find report at this time.

## 2014-07-16 NOTE — Progress Notes (Signed)
Subjective:    Patient ID: Chelsea Griffith, female    DOB: Jul 28, 1955, 59 y.o.   MRN: [id number]  HPI 07/09/14 Patient developed a rash on Saturday after working in her yard.  She has a diffuse rash on both forearms that consists of erythematous papules in clusters all over her ventral forearms. The rash is spread to her lower abdomen and her legs. It is extremely itching. She is tried over-the-counter cortisone and calamine without benefit. Her boyfriend has no rash. Her son has no rash.  At that time, my plan was: Patient has a contact dermatitis versus numerous insect bites. I recommended a prednisone taper pack for most likely contact dermatitis. If no better consider elimite for possible scabies.  07/16/14 She is here today for follow up.  The itch and instantly resumed as soon as she completed the prednisone. She now has numerous excoriations all over her forearm bilaterally. She also has excoriations and scabs on her left lower abdomen.  She has not been working outside. She denies any other known exposures. Past Medical History  Diagnosis Date  . Asthma   . Hyperlipidemia   . Anxiety   . Depression    Past Surgical History  Procedure Laterality Date  . Abdominal hysterectomy     Current Outpatient Prescriptions on File Prior to Visit  Medication Sig Dispense Refill  . albuterol (PROVENTIL HFA;VENTOLIN HFA) 108 (90 BASE) MCG/ACT inhaler Inhale 2 puffs into the lungs every 6 (six) hours as needed for wheezing or shortness of breath. 1 Inhaler 0  . beclomethasone (QVAR) 80 MCG/ACT inhaler Inhale 2 puffs into the lungs 2 (two) times daily. 1 Inhaler 12  . celecoxib (CELEBREX) 200 MG capsule TAKE 1 CAPSULE (200 MG TOTAL) BY MOUTH DAILY. 30 capsule 9  . clonazePAM (KLONOPIN) 1 MG tablet Take 1 mg by mouth at bedtime.     Marland Kitchen FLUoxetine (PROZAC) 40 MG capsule Take 40 mg by mouth daily.     Marland Kitchen omeprazole (PRILOSEC) 40 MG capsule TAKE 1 CAPSULE (40 MG TOTAL) BY MOUTH DAILY. 30 capsule 11  .  pravastatin (PRAVACHOL) 20 MG tablet TAKE 1 TABLET (20 MG TOTAL) BY MOUTH DAILY. 90 tablet 3  . predniSONE (DELTASONE) 20 MG tablet 3 tabs po qd x 2 days, 2 tabs po qd x 2 days, 1 tab po qd x 2 days 12 tablet 0  . traZODone (DESYREL) 100 MG tablet 100 mg at bedtime.     . triamcinolone cream (KENALOG) 0.1 % Apply 1 application topically 2 (two) times daily. 45 g 0   No current facility-administered medications on file prior to visit.   Allergies  Allergen Reactions  . Codeine Itching   History   Social History  . Marital Status: Divorced    Spouse Name: N/A  . Number of Children: N/A  . Years of Education: N/A   Occupational History  . Not on file.   Social History Main Topics  . Smoking status: Never Smoker   . Smokeless tobacco: Never Used  . Alcohol Use: 1.2 oz/week    2 Shots of liquor per week  . Drug Use: No  . Sexual Activity: Yes     Comment: single   Other Topics Concern  . Not on file   Social History Narrative      Review of Systems  All other systems reviewed and are negative.      Objective:   Physical Exam  Cardiovascular: Normal rate, regular rhythm and normal heart  sounds.   Pulmonary/Chest: Effort normal and breath sounds normal.  Skin: Rash noted. There is erythema.  Vitals reviewed.         Assessment & Plan:  Dermatitis due to unknown cause - Plan: permethrin (ELIMITE) 5 % cream  I am concerned the patient may have scabies. I recommended that she apply Elimite cream head to toe and rinse off after 8 hours and then washed all bed linens. Recheck if no better in a week. She can use triamcinolone cream twice daily as needed for itching for symptomatic relief.

## 2014-07-18 ENCOUNTER — Telehealth: Payer: Self-pay | Admitting: Family Medicine

## 2014-07-18 DIAGNOSIS — L259 Unspecified contact dermatitis, unspecified cause: Secondary | ICD-10-CM

## 2014-07-18 NOTE — Telephone Encounter (Signed)
Is the itching better?  If not, proceed with Derm consult.

## 2014-07-18 NOTE — Telephone Encounter (Signed)
Pt is no better - referral ordered and pt aware

## 2014-07-18 NOTE — Telephone Encounter (Signed)
[phone] PT has called and left a VM this morning stating that she has had some new spots that have came up and that Dr Dennard Schaumann would know what she is talking about.

## 2014-07-19 ENCOUNTER — Encounter: Payer: Self-pay | Admitting: Internal Medicine

## 2014-07-29 ENCOUNTER — Ambulatory Visit (AMBULATORY_SURGERY_CENTER): Payer: BLUE CROSS/BLUE SHIELD | Admitting: Internal Medicine

## 2014-07-29 ENCOUNTER — Encounter: Payer: Self-pay | Admitting: Internal Medicine

## 2014-07-29 VITALS — BP 110/62 | HR 206 | Temp 97.6°F | Resp 42 | Ht 63.0 in | Wt 157.0 lb

## 2014-07-29 DIAGNOSIS — Z1211 Encounter for screening for malignant neoplasm of colon: Secondary | ICD-10-CM | POA: Diagnosis not present

## 2014-07-29 MED ORDER — SODIUM CHLORIDE 0.9 % IV SOLN: 500.0000 mL | INTRAVENOUS | Status: DC

## 2014-07-29 NOTE — Progress Notes (Signed)
A/ox3 pleased with MAC, report to Wendy RN 

## 2014-07-29 NOTE — Progress Notes (Signed)
Called to room to assist during endoscopic procedure.  Patient ID and intended procedure confirmed with present staff. Received instructions for my participation in the procedure from the performing physician.  

## 2014-07-29 NOTE — Patient Instructions (Signed)
YOU HAD AN ENDOSCOPIC PROCEDURE TODAY AT [facility]:   Refer to the procedure report that was given to you for any specific questions about what was found during the examination.  If the procedure report does not answer your questions, please call your gastroenterologist to clarify.  If you requested that your care partner not be given the details of your procedure findings, then the procedure report has been included in a sealed envelope for you to review at your convenience later.  YOU SHOULD EXPECT: Some feelings of bloating in the abdomen. Passage of more gas than usual.  Walking can help get rid of the air that was put into your GI tract during the procedure and reduce the bloating. If you had a lower endoscopy (such as a colonoscopy or flexible sigmoidoscopy) you may notice spotting of blood in your stool or on the toilet paper. If you underwent a bowel prep for your procedure, you may not have a normal bowel movement for a few days.  Please Note:  You might notice some irritation and congestion in your nose or some drainage.  This is from the oxygen used during your procedure.  There is no need for concern and it should clear up in a day or so.  SYMPTOMS TO REPORT IMMEDIATELY:   Following lower endoscopy (colonoscopy or flexible sigmoidoscopy):  Excessive amounts of blood in the stool  Significant tenderness or worsening of abdominal pains  Swelling of the abdomen that is new, acute  Fever of 100F or higher   For urgent or emergent issues, a gastroenterologist can be reached at any hour by calling [phone].   DIET: Your first meal following the procedure should be a small meal and then it is ok to progress to your normal diet. Heavy or fried foods are harder to digest and may make you feel nauseous or bloated.  Likewise, meals heavy in dairy and vegetables can increase bloating.  Drink plenty of fluids but you should avoid alcoholic beverages for 24  hours.  ACTIVITY:  You should plan to take it easy for the rest of today and you should NOT DRIVE or use heavy machinery until tomorrow (because of the sedation medicines used during the test).    FOLLOW UP: Our staff will call the number listed on your records the next business day following your procedure to check on you and address any questions or concerns that you may have regarding the information given to you following your procedure. If we do not reach you, we will leave a message.  However, if you are feeling well and you are not experiencing any problems, there is no need to return our call.  We will assume that you have returned to your regular daily activities without incident.  If any biopsies were taken you will be contacted by phone or by letter within the next 1-3 weeks.  Please call us at [phone] if you have not heard about the biopsies in 3 weeks.    SIGNATURES/CONFIDENTIALITY: You and/or your care partner have signed paperwork which will be entered into your electronic medical record.  These signatures attest to the fact that that the information above on your After Visit Summary has been reviewed and is understood.  Full responsibility of the confidentiality of this discharge information lies with you and/or your care-partner.  Continue current medications. Please review high fiber diet handout provided.

## 2014-07-29 NOTE — Op Note (Signed)
[place]  [address] [place], [id number]   COLONOSCOPY PROCEDURE REPORT  PATIENT: Chelsea, Griffith  MR#: [id number] BIRTHDATE: 08-Oct-1955 , 59  yrs. old GENDER: female ENDOSCOPIST: Lafayette Dragon, MD REFERRED [id number] Dennard Schaumann, M.D. PROCEDURE DATE:  07/29/2014 PROCEDURE:   Colonoscopy, screening First Screening Colonoscopy - Avg.  risk and is 50 yrs.  old or older Yes.  Prior Negative Screening - Now for repeat screening. N/A  History of Adenoma - Now for follow-up colonoscopy & has been > or = to 3 yrs.  N/A  Polyps removed today? No Recommend repeat exam, <10 yrs? No ASA CLASS:   Class I INDICATIONS:Screening for colonic neoplasia and Colorectal Neoplasm Risk Assessment for this procedure is average risk. MEDICATIONS: Monitored anesthesia care and Propofol 200 mg IV  DESCRIPTION OF PROCEDURE:   After the risks benefits and alternatives of the procedure were thoroughly explained, informed consent was obtained.  The digital rectal exam revealed no abnormalities of the rectum.   The LB [id number]  endoscope was introduced through the anus and advanced to the cecum, which was identified by both the appendix and ileocecal valve. No adverse events experienced.   The quality of the prep was excellent. (MoviPrep was used)  The instrument was then slowly withdrawn as the colon was fully examined. Estimated blood loss is zero unless otherwise noted in this procedure report.      COLON FINDINGS: A normal appearing cecum, ileocecal valve, and appendiceal orifice were identified.  The ascending, transverse, descending, sigmoid colon, and rectum appeared unremarkable. Retroflexed views revealed no abnormalities. The time to cecum = 6.30 Withdrawal time = 6.09   The scope was withdrawn and the procedure completed. COMPLICATIONS: There were no immediate complications.  ENDOSCOPIC IMPRESSION: Normal colonoscopy  RECOMMENDATIONS: High-fiber diet Recall  colonoscopy in 10 years  eSigned:  Lafayette Dragon, MD 07/29/2014 11:07 AM   cc:

## 2014-07-29 NOTE — Progress Notes (Signed)
Per Dr. Delfin Edis, she made an error.  Pt is hear for a screening colonoscopy.  We do not need to send bx to pathology.  Bx disposed of properly.  No specimen was sent to pathology. maw

## 2014-07-30 ENCOUNTER — Telehealth: Payer: Self-pay

## 2014-07-30 NOTE — Telephone Encounter (Signed)
  Follow up Call-  Call back number 07/29/2014  Post procedure Call Back phone  # [place] phone [phone]  Permission to leave phone message Yes     Patient questions:  Do you have a fever, pain , or abdominal swelling? No. Pain Score  0 *  Have you tolerated food without any problems? Yes.    Have you been able to return to your normal activities? Yes.    Do you have any questions about your discharge instructions: Diet   No. Medications  No. Follow up visit  No.  Do you have questions or concerns about your Care? No.  Actions: * If pain score is 4 or above: No action needed, pain <4.  I spoke with the pt 10 year boyfriend, Marya Amsler.   He said she was still gasey, but no other sx. maw

## 2014-08-15 ENCOUNTER — Ambulatory Visit (INDEPENDENT_AMBULATORY_CARE_PROVIDER_SITE_OTHER): Payer: BLUE CROSS/BLUE SHIELD | Admitting: Physician Assistant

## 2014-08-15 ENCOUNTER — Encounter: Payer: Self-pay | Admitting: Physician Assistant

## 2014-08-15 VITALS — BP 132/80 | HR 75 | Temp 98.0°F | Resp 19 | Wt 146.0 lb

## 2014-08-15 DIAGNOSIS — S61219A Laceration without foreign body of unspecified finger without damage to nail, initial encounter: Secondary | ICD-10-CM | POA: Diagnosis not present

## 2014-08-15 NOTE — Progress Notes (Signed)
Patient ID: Chelsea Griffith MRN: [id number], DOB: 08-Feb-1956, 59 y.o. Date of Encounter: 08/15/2014, 11:24 AM    Chief Complaint:  Chief Complaint  Patient presents with  . Bleeding     cut finger     HPI: 59 y.o. year old white female says that this just happened a few minutes ago. Says that she was trying to open a can and was prying it with a knife but her hand slipped. Cut her right second finger.     Home Meds:   Outpatient Prescriptions Prior to Visit  Medication Sig Dispense Refill  . albuterol (PROVENTIL HFA;VENTOLIN HFA) 108 (90 BASE) MCG/ACT inhaler Inhale 2 puffs into the lungs every 6 (six) hours as needed for wheezing or shortness of breath. 1 Inhaler 0  . beclomethasone (QVAR) 80 MCG/ACT inhaler Inhale 2 puffs into the lungs 2 (two) times daily. 1 Inhaler 12  . calcium-vitamin D (OSCAL WITH D) 500-200 MG-UNIT per tablet Take 1 tablet by mouth.    . celecoxib (CELEBREX) 200 MG capsule TAKE 1 CAPSULE (200 MG TOTAL) BY MOUTH DAILY. 30 capsule 9  . clonazePAM (KLONOPIN) 1 MG tablet Take 1 mg by mouth at bedtime.     Marland Kitchen denosumab (PROLIA) 60 MG/ML SOLN injection Inject 60 mg into the skin every 6 (six) months. Administer in upper arm, thigh, or abdomen    . FLUoxetine (PROZAC) 40 MG capsule Take 40 mg by mouth daily.     Marland Kitchen omeprazole (PRILOSEC) 40 MG capsule TAKE 1 CAPSULE (40 MG TOTAL) BY MOUTH DAILY. 30 capsule 11  . pravastatin (PRAVACHOL) 20 MG tablet TAKE 1 TABLET (20 MG TOTAL) BY MOUTH DAILY. 90 tablet 3  . traZODone (DESYREL) 100 MG tablet 50 mg at bedtime.     . permethrin (ELIMITE) 5 % cream Apply 1 application topically once. (Patient not taking: Reported on 08/15/2014) 60 g 0  . triamcinolone cream (KENALOG) 0.1 % Apply 1 application topically 2 (two) times daily. 45 g 0   No facility-administered medications prior to visit.    Allergies:  Allergies  Allergen Reactions  . Codeine Itching      Review of Systems: See HPI for pertinent ROS. All other ROS  negative.    Physical Exam: Blood pressure 132/80, pulse 75, temperature 98 F (36.7 C), temperature source Oral, resp. rate 19, weight 146 lb (66.225 kg)., Body mass index is 25.87 kg/(m^2). General:  WNWD WF. Appears in no acute distress. Neck: Supple. No thyromegaly. No lymphadenopathy. Lungs: Clear bilaterally to auscultation without wheezes, rales, or rhonchi. Breathing is unlabored. Heart: Regular rhythm. No murmurs, rubs, or gallops. Msk:  Strength and tone normal for age. Extremities/Skin:  Right 2nd Finger: lateral side of finger, about half way between the DIP joint and the tip of the finger: there is a laceration that is approx 1 cm in length. Sliced at an angle, under the skin. Only approx 28mm deep.  Neuro: Alert and oriented X 3. Moves all extremities spontaneously. Gait is normal. CNII-XII grossly in tact. Psych:  Responds to questions appropriately with a normal affect.     ASSESSMENT AND PLAN:  59 y.o. year old female with  1. Laceration of finger, initial encounter Pressure applied and bleeding stopped with this. Site was cleansed with saline. 2 Steri-Strips applied Finger Splint was applied over this to keep the finger straight and prevent bending and also to keep the finger clean and dry. Pt states that she has to go home and finish cooking  what she was cooking. Also says that she has to take care of her horses 3 times a day. Told her to keep this finger clean and dry. Steri-Strips need to stay in place and clean and dry for 7 days. Follow-up if any bleeding or problems in the interim or if she does not have wound closure in 7 days. Also, follow-up if else redness around the area or develops any purulent drainage from the site.  She did receive a T dap 04/13/2013. This is up-to-date. She is on no anti-coagulants so this is no issue.   Signed, [address] [place], [place], [facility] 08/15/2014 11:24 AM

## 2014-09-05 ENCOUNTER — Other Ambulatory Visit (HOSPITAL_COMMUNITY): Payer: Self-pay | Admitting: Obstetrics and Gynecology

## 2014-09-05 DIAGNOSIS — Z1231 Encounter for screening mammogram for malignant neoplasm of breast: Secondary | ICD-10-CM

## 2014-09-10 ENCOUNTER — Ambulatory Visit (HOSPITAL_COMMUNITY)
Admission: RE | Admit: 2014-09-10 | Discharge: 2014-09-10 | Disposition: A | Discharge status: Home / Self Care | Payer: BLUE CROSS/BLUE SHIELD | Source: Ambulatory Visit | Attending: Obstetrics and Gynecology | Admitting: Obstetrics and Gynecology

## 2014-09-10 DIAGNOSIS — Z1231 Encounter for screening mammogram for malignant neoplasm of breast: Secondary | ICD-10-CM | POA: Diagnosis present

## 2014-10-21 ENCOUNTER — Other Ambulatory Visit: Payer: Self-pay | Admitting: Family Medicine

## 2014-11-07 ENCOUNTER — Telehealth: Payer: Self-pay | Admitting: Family Medicine

## 2014-11-07 NOTE — Telephone Encounter (Signed)
Pt has been on Prolia.  Has had it approved by her insurance in the past.  Has already received these injections.  Have tried to renew PA for Prolia and it has come back denied.  I called pt and she denies having tried any of the oral meds.  Her GYN put her on Prolia, that is where she originally started to receive injections.  She has opted to contact her GYN and get her advise on how to proceed.  Told her to let me know what GYN decides.

## 2014-12-31 ENCOUNTER — Telehealth: Payer: Self-pay | Admitting: Family Medicine

## 2014-12-31 NOTE — Telephone Encounter (Signed)
Patient called in requesting a call back. I am sorry I forgot to write down the rest of the message. I have called and left vm asking her to return my call.

## 2014-12-31 NOTE — Telephone Encounter (Signed)
Patient called back and as she started telling me I remembered her message.  She is going to [place] and she has Asthma and she wanted to know if she needed to do something differently. Please advise.

## 2015-01-01 NOTE — Telephone Encounter (Signed)
Be sure to take meds as ordered.  Keep rescue inhaler with her at all times.  Don't stay out in extreme cold for extended periods

## 2015-02-09 ENCOUNTER — Other Ambulatory Visit: Payer: Self-pay | Admitting: Family Medicine

## 2015-02-10 ENCOUNTER — Emergency Department (HOSPITAL_COMMUNITY)
Admission: EM | Admit: 2015-02-10 | Discharge: 2015-02-10 | Disposition: A | Discharge status: Home / Self Care | Payer: BLUE CROSS/BLUE SHIELD | Attending: Emergency Medicine | Admitting: Emergency Medicine

## 2015-02-10 ENCOUNTER — Emergency Department (HOSPITAL_COMMUNITY): Payer: BLUE CROSS/BLUE SHIELD

## 2015-02-10 ENCOUNTER — Encounter (HOSPITAL_COMMUNITY): Payer: Self-pay | Admitting: Emergency Medicine

## 2015-02-10 DIAGNOSIS — F329 Major depressive disorder, single episode, unspecified: Secondary | ICD-10-CM | POA: Insufficient documentation

## 2015-02-10 DIAGNOSIS — N390 Urinary tract infection, site not specified: Secondary | ICD-10-CM | POA: Diagnosis not present

## 2015-02-10 DIAGNOSIS — F419 Anxiety disorder, unspecified: Secondary | ICD-10-CM | POA: Insufficient documentation

## 2015-02-10 DIAGNOSIS — J45909 Unspecified asthma, uncomplicated: Secondary | ICD-10-CM | POA: Diagnosis not present

## 2015-02-10 DIAGNOSIS — E785 Hyperlipidemia, unspecified: Secondary | ICD-10-CM | POA: Insufficient documentation

## 2015-02-10 DIAGNOSIS — Z7951 Long term (current) use of inhaled steroids: Secondary | ICD-10-CM | POA: Insufficient documentation

## 2015-02-10 DIAGNOSIS — Z79899 Other long term (current) drug therapy: Secondary | ICD-10-CM | POA: Diagnosis not present

## 2015-02-10 DIAGNOSIS — M545 Low back pain: Secondary | ICD-10-CM | POA: Diagnosis present

## 2015-02-10 LAB — URINALYSIS, ROUTINE W REFLEX MICROSCOPIC
Glucose, UA: NEGATIVE mg/dL
Ketones, ur: 15 mg/dL — AB
NITRITE: NEGATIVE
PH: 5 (ref 5.0–8.0)
PROTEIN: NEGATIVE mg/dL
SPECIFIC GRAVITY, URINE: 1.028 (ref 1.005–1.030)

## 2015-02-10 LAB — COMPREHENSIVE METABOLIC PANEL
ALBUMIN: 4 g/dL (ref 3.5–5.0)
ALK PHOS: 85 U/L (ref 38–126)
ALT: 79 U/L — ABNORMAL HIGH (ref 14–54)
AST: 82 U/L — AB (ref 15–41)
Anion gap: 8 (ref 5–15)
BILIRUBIN TOTAL: 0.6 mg/dL (ref 0.3–1.2)
BUN: 15 mg/dL (ref 6–20)
CALCIUM: 9.2 mg/dL (ref 8.9–10.3)
CO2: 29 mmol/L (ref 22–32)
Chloride: 104 mmol/L (ref 101–111)
Creatinine, Ser: 0.84 mg/dL (ref 0.44–1.00)
GFR calc Af Amer: >60 mL/min (ref >60)
GFR calc non Af Amer: >60 mL/min (ref >60)
GLUCOSE: 147 mg/dL — AB (ref 65–99)
Potassium: 3.3 mmol/L — ABNORMAL LOW (ref 3.5–5.1)
Sodium: 141 mmol/L (ref 135–145)
Total Protein: 6.6 g/dL (ref 6.5–8.1)

## 2015-02-10 LAB — CBC WITH DIFFERENTIAL/PLATELET
BASOS ABS: 0 10*3/uL (ref 0.0–0.1)
BASOS PCT: 0 %
Eosinophils Absolute: 0 10*3/uL (ref 0.0–0.7)
Eosinophils Relative: 1 %
HEMATOCRIT: 38.8 % (ref 36.0–46.0)
HEMOGLOBIN: 13.2 g/dL (ref 12.0–15.0)
Lymphocytes Relative: 18 %
Lymphs Abs: 1 10*3/uL (ref 0.7–4.0)
MCH: 29.1 pg (ref 26.0–34.0)
MCHC: 34 g/dL (ref 30.0–36.0)
MCV: 85.7 fL (ref 78.0–100.0)
MONOS PCT: 4 %
Monocytes Absolute: 0.2 10*3/uL (ref 0.1–1.0)
NEUTROS ABS: 4.6 10*3/uL (ref 1.7–7.7)
NEUTROS PCT: 78 %
Platelets: 178 10*3/uL (ref 150–400)
RBC: 4.53 MIL/uL (ref 3.87–5.11)
RDW: 12.7 % (ref 11.5–15.5)
WBC: 5.9 10*3/uL (ref 4.0–10.5)

## 2015-02-10 LAB — URINE MICROSCOPIC-ADD ON

## 2015-02-10 LAB — LIPASE, BLOOD: Lipase: 37 U/L (ref 11–51)

## 2015-02-10 MED ORDER — PHENAZOPYRIDINE HCL 200 MG PO TABS: 200.0000 mg | ORAL_TABLET | Freq: Three times a day (TID) | ORAL | Status: DC

## 2015-02-10 MED ORDER — HYDROCODONE-ACETAMINOPHEN 5-325 MG PO TABS: 1.0000 | ORAL_TABLET | ORAL | Status: DC | PRN

## 2015-02-10 MED ORDER — DOCUSATE SODIUM 100 MG PO CAPS: 100.0000 mg | ORAL_CAPSULE | Freq: Two times a day (BID) | ORAL | Status: DC

## 2015-02-10 MED ORDER — ONDANSETRON HCL 4 MG/2ML IJ SOLN
4.0000 mg | Freq: Once | INTRAMUSCULAR | Status: AC
  Administered 2015-02-10: 4 mg via INTRAVENOUS
  Filled 2015-02-10: qty 2

## 2015-02-10 MED ORDER — CEPHALEXIN 500 MG PO CAPS: 500.0000 mg | ORAL_CAPSULE | Freq: Four times a day (QID) | ORAL | Status: DC

## 2015-02-10 MED ORDER — DEXTROSE 5 % IV SOLN
1.0000 g | Freq: Once | INTRAVENOUS | Status: AC
  Administered 2015-02-10: 1 g via INTRAVENOUS
  Filled 2015-02-10: qty 10

## 2015-02-10 MED ORDER — MORPHINE SULFATE (PF) 2 MG/ML IV SOLN
2.0000 mg | Freq: Once | INTRAVENOUS | Status: AC
  Administered 2015-02-10: 2 mg via INTRAVENOUS
  Filled 2015-02-10: qty 1

## 2015-02-10 MED ORDER — SODIUM CHLORIDE 0.9 % IV BOLUS (SEPSIS)
500.0000 mL | Freq: Once | INTRAVENOUS | Status: AC
  Administered 2015-02-10: 500 mL via INTRAVENOUS

## 2015-02-10 NOTE — ED Notes (Signed)
Pt taken to CT.

## 2015-02-10 NOTE — ED Provider Notes (Signed)
CSN: [id number]     Arrival date & time 02/10/15  0418 History   First MD Initiated Contact with Patient 02/10/15 0422     Chief Complaint  Patient presents with  . Urinary Tract Infection  . Back Pain     (Consider location/radiation/quality/duration/timing/severity/associated sxs/prior Treatment) HPI Comments: Patient presents to the emergency department for evaluation of abdominal pain and back pain. Patient reports that she thinks she might have a urinary tract infection. Symptoms began several days ago. She reports at that time she was having pain and sensation of needing to continue urinating after she finished urinating. Since then, however, she has developed more continuous abdominal pain with pain that is diffuse across her back. She developed nausea and vomiting at home just prior to arrival in the ER. She has not had a fever.  Patient is a 59 y.o. female presenting with urinary tract infection and back pain.  Urinary Tract Infection Associated symptoms: abdominal pain, nausea and vomiting   Back Pain Associated symptoms: abdominal pain and dysuria     Past Medical History  Diagnosis Date  . Asthma   . Hyperlipidemia   . Anxiety   . Depression   . Osteoporosis     right hip   Past Surgical History  Procedure Laterality Date  . Abdominal hysterectomy    . Elbow surgery       2times/1 time on left elbow  . Rotator cuff repair      right shoulder  . Knee surgery      right knee  . Partial hysterectomy      2 times  . Cesarean section       2 times  . Dilation and curettage of uterus      several   Family History  Problem Relation Age of Onset  . Hyperlipidemia Mother   . ADD / ADHD Son   . Heart disease Maternal Grandmother   . Colon cancer Neg Hx   . Esophageal cancer Neg Hx   . Rectal cancer Neg Hx   . Stomach cancer Neg Hx    Social History  Substance Use Topics  . Smoking status: Never Smoker   . Smokeless tobacco: Never Used  . Alcohol Use: 0.6  oz/week    1 Shots of liquor per week     Comment: occasional weekly drink   OB History    No data available     Review of Systems  Gastrointestinal: Positive for nausea, vomiting and abdominal pain.  Genitourinary: Positive for dysuria.  Musculoskeletal: Positive for back pain.  All other systems reviewed and are negative.     Allergies  Codeine  Home Medications   Prior to Admission medications   Medication Sig Start Date End Date Taking? Authorizing Provider  beclomethasone (QVAR) 80 MCG/ACT inhaler Inhale 2 puffs into the lungs 2 (two) times daily. 04/19/14  Yes Susy Frizzle, MD  calcium-vitamin D (OSCAL WITH D) 500-200 MG-UNIT per tablet Take 1 tablet by mouth.   Yes Historical Provider, MD  celecoxib (CELEBREX) 200 MG capsule TAKE 1 CAPSULE (200 MG TOTAL) BY MOUTH DAILY. 04/16/14  Yes Susy Frizzle, MD  clonazePAM (KLONOPIN) 1 MG tablet Take 1 mg by mouth at bedtime.  04/10/13  Yes Historical Provider, MD  FLUoxetine (PROZAC) 40 MG capsule Take 40 mg by mouth daily.  04/04/13  Yes Historical Provider, MD  omeprazole (PRILOSEC) 40 MG capsule TAKE 1 CAPSULE (40 MG TOTAL) BY MOUTH DAILY. 02/07/14  Yes Cletus Gash  Avel Peace, MD  pravastatin (PRAVACHOL) 20 MG tablet TAKE 1 TABLET (20 MG TOTAL) BY MOUTH DAILY. 06/17/14  Yes Susy Frizzle, MD  PROAIR HFA 108 (90 BASE) MCG/ACT inhaler INHALE 2 PUFFS INTO THE LUNGS EVERY 6 (SIX) HOURS AS NEEDED FOR WHEEZING OR SHORTNESS OF BREATH. 10/21/14  Yes Susy Frizzle, MD  traZODone (DESYREL) 100 MG tablet 50 mg at bedtime.  03/02/13  Yes Historical Provider, MD   BP 119/63 mmHg  Pulse 59  Temp(Src) 97.4 F (36.3 C) (Oral)  Resp 16  Ht 5\' 3"  (1.6 m)  Wt 150 lb (68.04 kg)  BMI 26.58 kg/m2  SpO2 98% Physical Exam  Constitutional: She is oriented to person, place, and time. She appears well-developed and well-nourished. No distress.  HENT:  Head: Normocephalic and atraumatic.  Right Ear: Hearing normal.  Left Ear: Hearing normal.    Nose: Nose normal.  Mouth/Throat: Oropharynx is clear and moist and mucous membranes are normal.  Eyes: Conjunctivae and EOM are normal. Pupils are equal, round, and reactive to light.  Neck: Normal range of motion. Neck supple.  Cardiovascular: Regular rhythm, S1 normal and S2 normal.  Exam reveals no gallop and no friction rub.   No murmur heard. Pulmonary/Chest: Effort normal and breath sounds normal. No respiratory distress. She exhibits no tenderness.  Abdominal: Soft. Normal appearance and bowel sounds are normal. There is no hepatosplenomegaly. There is tenderness in the suprapubic area and left lower quadrant. There is no rebound, no guarding, no tenderness at McBurney's point and negative Murphy's sign. No hernia.  Musculoskeletal: Normal range of motion.       Lumbar back: She exhibits tenderness.       Back:  Neurological: She is alert and oriented to person, place, and time. She has normal strength. No cranial nerve deficit or sensory deficit. Coordination normal. GCS eye subscore is 4. GCS verbal subscore is 5. GCS motor subscore is 6.  Skin: Skin is warm, dry and intact. No rash noted. No cyanosis.  Psychiatric: She has a normal mood and affect. Her speech is normal and behavior is normal. Thought content normal.  Nursing note and vitals reviewed.   ED Course  Procedures (including critical care time) Labs Review Labs Reviewed  COMPREHENSIVE METABOLIC PANEL - Abnormal; Notable for the following:    Potassium 3.3 (*)    Glucose, Bld 147 (*)    AST 82 (*)    ALT 79 (*)    All other components within normal limits  URINALYSIS, ROUTINE W REFLEX MICROSCOPIC (NOT AT [facility]) - Abnormal; Notable for the following:    Color, Urine AMBER (*)    Hgb urine dipstick MODERATE (*)    Bilirubin Urine SMALL (*)    Ketones, ur 15 (*)    Leukocytes, UA TRACE (*)    All other components within normal limits  URINE MICROSCOPIC-ADD ON - Abnormal; Notable for the following:    Squamous  Epithelial / LPF 0-5 (*)    Bacteria, UA FEW (*)    Crystals CA OXALATE CRYSTALS (*)    All other components within normal limits  URINE CULTURE  CBC WITH DIFFERENTIAL/PLATELET  LIPASE, BLOOD    Imaging Review Ct Renal Stone Study  02/10/2015  CLINICAL DATA:  60 year old female with back pain and hematuria. Concern for urinary tract infection. EXAM: CT ABDOMEN AND PELVIS WITHOUT CONTRAST TECHNIQUE: Multidetector CT imaging of the abdomen and pelvis was performed following the standard protocol without IV contrast. COMPARISON:  Pelvic ultrasound dated  10/16/2003 FINDINGS: Evaluation of this exam is limited in the absence of intravenous contrast. The visualized lung bases are clear. No intra-abdominal free air or free fluid. The liver appears unremarkable. The gallbladder is minimally distended. No calcified stone or pericholecystic fluid identified. The pancreas, spleen, adrenal glands appear unremarkable. There is a 3 mm nonobstructing left renal interpolar calculus. There is no hydronephrosis. The right kidney is unremarkable. The visualized ureters and urinary bladder appear unremarkable. Hysterectomy. There is moderate stool throughout the colon. No evidence of bowel obstruction or inflammation. Normal appendix. The abdominal aorta and IVC appear grossly unremarkable on this noncontrast study. No portal venous gas identified. There is no lymphadenopathy. Small fat containing umbilical hernia. The abdominal wall soft tissues appear unremarkable. The osseous structures are intact. IMPRESSION: A 3 mm nonobstructing left renal interpolar calculus. No hydronephrosis. Constipation. No evidence of bowel obstruction or inflammation. Normal appendix. Electronically Signed   By: Anner Crete M.D.   On: 02/10/2015 06:38   I have personally reviewed and evaluated these images and lab results as part of my medical decision-making.   EKG Interpretation None      MDM   Final diagnoses:  None    UTI  Presents to the emergency room for evaluation of back pain, dysuria, abdominal discomfort. Patient reports that she has been experiencing dysuria for the last several days. This morning she had onset of low back pain that has been diffuse and had nausea and vomiting. She appears uncomfortable at arrival. Vital signs are essentially normal, no fever, no tachycardia, no hypotension. No sign of sepsis. Blood work is normal. Urinalysis does show trace leukocytes with 6-30 white cells and 6-30 red cells. There are calcium oxalate crystals. This raises concern for possible kidney stone. CT scan performed. She does have a small left renal stone, but no evidence of hydronephrosis. There is evidence of constipation without obstruction. Cannot rule out recently passed stone, but will empirically treat for infection based on her symptoms.    Orpah Greek, MD 02/10/15 [phone]

## 2015-02-10 NOTE — ED Notes (Signed)
Patient c/o "funny feeling after peeing," like she has a urinary tract infection. C/o pain in lower back starting a few days ago. Patient also reports abdomen feels weird and vomiting episode before coming in.

## 2015-02-10 NOTE — Discharge Instructions (Signed)

## 2015-02-10 NOTE — ED Notes (Signed)
MD at bedside. 

## 2015-02-11 LAB — URINE CULTURE

## 2015-02-25 ENCOUNTER — Encounter: Payer: Self-pay | Admitting: Family Medicine

## 2015-02-25 ENCOUNTER — Ambulatory Visit (INDEPENDENT_AMBULATORY_CARE_PROVIDER_SITE_OTHER): Payer: BLUE CROSS/BLUE SHIELD | Admitting: Family Medicine

## 2015-02-25 VITALS — BP 126/70 | HR 68 | Temp 98.4°F | Resp 14 | Ht 63.0 in | Wt 158.0 lb

## 2015-02-25 DIAGNOSIS — R300 Dysuria: Secondary | ICD-10-CM | POA: Diagnosis not present

## 2015-02-25 DIAGNOSIS — R945 Abnormal results of liver function studies: Secondary | ICD-10-CM

## 2015-02-25 DIAGNOSIS — R7989 Other specified abnormal findings of blood chemistry: Secondary | ICD-10-CM

## 2015-02-25 LAB — URINALYSIS, ROUTINE W REFLEX MICROSCOPIC
Bilirubin Urine: NEGATIVE
Glucose, UA: NEGATIVE
HGB URINE DIPSTICK: NEGATIVE
Ketones, ur: NEGATIVE
LEUKOCYTES UA: NEGATIVE
NITRITE: NEGATIVE
PROTEIN: NEGATIVE
Specific Gravity, Urine: 1.02 (ref 1.001–1.035)
pH: 6.5 (ref 5.0–8.0)

## 2015-02-25 LAB — COMPLETE METABOLIC PANEL WITH GFR
ALT: 21 U/L (ref 6–29)
AST: 20 U/L (ref 10–35)
Albumin: 4.5 g/dL (ref 3.6–5.1)
Alkaline Phosphatase: 69 U/L (ref 33–130)
BILIRUBIN TOTAL: 0.4 mg/dL (ref 0.2–1.2)
BUN: 11 mg/dL (ref 7–25)
CHLORIDE: 104 mmol/L (ref 98–110)
CO2: 27 mmol/L (ref 20–31)
CREATININE: 0.82 mg/dL (ref 0.50–0.99)
Calcium: 9.4 mg/dL (ref 8.6–10.4)
GFR, Est African American: >89 mL/min (ref >=60)
GFR, Est Non African American: 78 mL/min (ref >=60)
GLUCOSE: 93 mg/dL (ref 70–99)
Potassium: 4 mmol/L (ref 3.5–5.3)
SODIUM: 141 mmol/L (ref 135–146)
TOTAL PROTEIN: 6.5 g/dL (ref 6.1–8.1)

## 2015-02-25 NOTE — Progress Notes (Signed)
Subjective:    Patient ID: Chelsea Griffith, female    DOB: 10/22/1955, 59 y.o.   MRN: [id number]  HPI Patient went to the emergency room on December 5 with a urinary tract infection. Symptoms included low back pain, urinary frequency and urinary urgency. She completed 3-4 days of antibiotics. Culture showed multiple bacterial types consistent with more likely contamination. Patient would like to recheck her urine sample to make sure the infection has resolved. She denies any low back pain. She denies any frequency or urgency. Her only symptom is increased frequency of urination although there is no discomfort. She may void 3-4 times a day.  In the emergency room, her liver function tests were slightly elevated without reason. I would like to follow this up. Past Medical History  Diagnosis Date  . Asthma   . Hyperlipidemia   . Anxiety   . Depression   . Osteoporosis     right hip   Past Surgical History  Procedure Laterality Date  . Abdominal hysterectomy    . Elbow surgery       2times/1 time on left elbow  . Rotator cuff repair      right shoulder  . Knee surgery      right knee  . Partial hysterectomy      2 times  . Cesarean section       2 times  . Dilation and curettage of uterus      several   Current Outpatient Prescriptions on File Prior to Visit  Medication Sig Dispense Refill  . beclomethasone (QVAR) 80 MCG/ACT inhaler Inhale 2 puffs into the lungs 2 (two) times daily. 1 Inhaler 12  . calcium-vitamin D (OSCAL WITH D) 500-200 MG-UNIT per tablet Take 1 tablet by mouth.    . celecoxib (CELEBREX) 200 MG capsule TAKE 1 CAPSULE (200 MG TOTAL) BY MOUTH DAILY. 30 capsule 9  . clonazePAM (KLONOPIN) 1 MG tablet Take 1 mg by mouth at bedtime.     . docusate sodium (COLACE) 100 MG capsule Take 1 capsule (100 mg total) by mouth every 12 (twelve) hours. 60 capsule 0  . FLUoxetine (PROZAC) 40 MG capsule Take 40 mg by mouth daily.     Marland Kitchen HYDROcodone-acetaminophen (NORCO/VICODIN)  5-325 MG tablet Take 1 tablet by mouth every 4 (four) hours as needed for moderate pain. 10 tablet 0  . omeprazole (PRILOSEC) 40 MG capsule TAKE 1 CAPSULE (40 MG TOTAL) BY MOUTH DAILY. 30 capsule 11  . phenazopyridine (PYRIDIUM) 200 MG tablet Take 1 tablet (200 mg total) by mouth 3 (three) times daily. 6 tablet 0  . pravastatin (PRAVACHOL) 20 MG tablet TAKE 1 TABLET (20 MG TOTAL) BY MOUTH DAILY. 90 tablet 3  . PROAIR HFA 108 (90 BASE) MCG/ACT inhaler INHALE 2 PUFFS INTO THE LUNGS EVERY 6 (SIX) HOURS AS NEEDED FOR WHEEZING OR SHORTNESS OF BREATH. 8.5 Inhaler 5  . traZODone (DESYREL) 100 MG tablet 50 mg at bedtime.      No current facility-administered medications on file prior to visit.   Allergies  Allergen Reactions  . Codeine Itching   Social History   Social History  . Marital Status: Divorced    Spouse Name: N/A  . Number of Children: N/A  . Years of Education: N/A   Occupational History  . Not on file.   Social History Main Topics  . Smoking status: Never Smoker   . Smokeless tobacco: Never Used  . Alcohol Use: 0.6 oz/week    1 Shots  of liquor per week     Comment: occasional weekly drink  . Drug Use: No  . Sexual Activity: Yes     Comment: single   Other Topics Concern  . Not on file   Social History Narrative      Review of Systems  All other systems reviewed and are negative.      Objective:   Physical Exam  Cardiovascular: Normal rate, regular rhythm and normal heart sounds.   Pulmonary/Chest: Effort normal and breath sounds normal. No respiratory distress. She has no wheezes. She has no rales.  Abdominal: Soft. Bowel sounds are normal. She exhibits no distension. There is no tenderness. There is no rebound.  Musculoskeletal: She exhibits no edema.  Vitals reviewed. no cvat        Assessment & Plan:  Dysuria - Plan: Urinalysis, Routine w reflex microscopic (not at [facility])  Elevated LFTs - Plan: COMPLETE METABOLIC PANEL WITH GFR  Urine sample was  completely clear today. There is no evidence of urinary tract infection. No further follow-up is necessary for this. I will check liver function test and if persistently elevated I will obtain viral hepatitis serologies and also have the patient temporarily discontinue pravastatin.

## 2015-03-14 ENCOUNTER — Other Ambulatory Visit: Payer: Self-pay | Admitting: Family Medicine

## 2015-03-17 ENCOUNTER — Other Ambulatory Visit: Payer: Self-pay | Admitting: Family Medicine

## 2015-03-21 MED ORDER — MELOXICAM 15 MG PO TABS: ORAL_TABLET | ORAL | Status: DC

## 2015-03-21 NOTE — Addendum Note (Signed)
Addended by: Shary Decamp B on: 03/21/2015 05:10 PM   Modules accepted: Orders

## 2015-04-10 ENCOUNTER — Ambulatory Visit (INDEPENDENT_AMBULATORY_CARE_PROVIDER_SITE_OTHER): Payer: BLUE CROSS/BLUE SHIELD | Admitting: Physician Assistant

## 2015-04-10 ENCOUNTER — Encounter: Payer: Self-pay | Admitting: Physician Assistant

## 2015-04-10 VITALS — BP 106/78 | HR 68 | Temp 98.4°F | Resp 18 | Wt 157.0 lb

## 2015-04-10 DIAGNOSIS — R5383 Other fatigue: Secondary | ICD-10-CM

## 2015-04-10 DIAGNOSIS — R4189 Other symptoms and signs involving cognitive functions and awareness: Secondary | ICD-10-CM | POA: Diagnosis not present

## 2015-04-10 LAB — CBC WITH DIFFERENTIAL/PLATELET
BASOS ABS: 0 10*3/uL (ref 0.0–0.1)
BASOS PCT: 0 % (ref 0–1)
EOS ABS: 0.1 10*3/uL (ref 0.0–0.7)
Eosinophils Relative: 1 % (ref 0–5)
HCT: 40.2 % (ref 36.0–46.0)
Hemoglobin: 13.8 g/dL (ref 12.0–15.0)
Lymphocytes Relative: 25 % (ref 12–46)
Lymphs Abs: 1.8 10*3/uL (ref 0.7–4.0)
MCH: 29.3 pg (ref 26.0–34.0)
MCHC: 34.3 g/dL (ref 30.0–36.0)
MCV: 85.4 fL (ref 78.0–100.0)
MONOS PCT: 4 % (ref 3–12)
MPV: 9.3 fL (ref 8.6–12.4)
Monocytes Absolute: 0.3 10*3/uL (ref 0.1–1.0)
NEUTROS ABS: 5 10*3/uL (ref 1.7–7.7)
NEUTROS PCT: 70 % (ref 43–77)
PLATELETS: 199 10*3/uL (ref 150–400)
RBC: 4.71 MIL/uL (ref 3.87–5.11)
RDW: 13 % (ref 11.5–15.5)
WBC: 7.2 10*3/uL (ref 4.0–10.5)

## 2015-04-10 LAB — URINALYSIS, ROUTINE W REFLEX MICROSCOPIC
Bilirubin Urine: NEGATIVE
GLUCOSE, UA: NEGATIVE
Hgb urine dipstick: NEGATIVE
Ketones, ur: NEGATIVE
Nitrite: NEGATIVE
PROTEIN: NEGATIVE
Specific Gravity, Urine: 1.01 (ref 1.001–1.035)
pH: 7 (ref 5.0–8.0)

## 2015-04-10 LAB — COMPLETE METABOLIC PANEL WITH GFR
ALT: 41 U/L — ABNORMAL HIGH (ref 6–29)
AST: 31 U/L (ref 10–35)
Albumin: 4.6 g/dL (ref 3.6–5.1)
Alkaline Phosphatase: 78 U/L (ref 33–130)
BILIRUBIN TOTAL: 0.5 mg/dL (ref 0.2–1.2)
BUN: 15 mg/dL (ref 7–25)
CO2: 29 mmol/L (ref 20–31)
Calcium: 9.5 mg/dL (ref 8.6–10.4)
Chloride: 102 mmol/L (ref 98–110)
Creat: 0.86 mg/dL (ref 0.50–0.99)
GFR, EST NON AFRICAN AMERICAN: 74 mL/min (ref >=60)
GFR, Est African American: 85 mL/min (ref >=60)
GLUCOSE: 94 mg/dL (ref 70–99)
Potassium: 4.3 mmol/L (ref 3.5–5.3)
SODIUM: 140 mmol/L (ref 135–146)
TOTAL PROTEIN: 6.8 g/dL (ref 6.1–8.1)

## 2015-04-10 LAB — URINALYSIS, MICROSCOPIC ONLY
Casts: NONE SEEN [LPF]
Crystals: NONE SEEN [HPF]
Yeast: NONE SEEN [HPF]

## 2015-04-10 LAB — TSH: TSH: 0.942 u[IU]/mL (ref 0.350–4.500)

## 2015-04-10 NOTE — Progress Notes (Signed)
Patient ID: ELEISHA ANTONIO MRN: [id number], DOB: 1955/07/10, 60 y.o. Date of Encounter: @DATE @  Chief Complaint:  Chief Complaint  Patient presents with  . constantly tired    having episodes can't think clearly    HPI: 60 y.o. year old white female  presents with above.   She says that she has had this increased tiredness for the past 2 weeks. Says that she "feels like she wants to close my eyes all the time"--x 2 weeks.   Says that her schedule has changed a little bit--but, doesn't think that should be causing this much symptom/issue. Says that she used to go to bed earlier around 8 or 9 and now --because of the feeding schedule for the horses-- they have to be fed at 10 PM. Says she goes and feeds them and then comes back and goes to sleep. Says that most mornings she wakes up around 6 or 6:30. Some days recently she has been taking a nap. Says that at night she sleeps through from when she goes to bed right after feeding her horses until 6 or 6:30 AM. Does not feel that she is tossing and turning and is not having insomnia.  Says that she "does not feel as alert and with it as she should ".  Regarding feeling like she can't think clearly-- the only 2 specific examples she can give are the following:  This morning at the barn,  she forgot to close a barn door. Says that the horse is not supposed to have the hay that the or other horses have. Says it can actually kill that horse to eat that hay.  Says that yesterday at the grocery store, the cashier said that the total was $17.33 . Says that she handed her $16. Says the cashier then said that repeated the total. Patient then handed her 1 more dollar. Pt says the cashier then looked at her again--- still had not given the correct amount. Says that it was embarrassing--- but she could not thinking clearly.  Asked if she is having any increased stress going on that she may be preoccupied about and she says no. Does not feel  that she is having anxiety or depression and does not feel that she is preoccupied with something else.  Also asked about menopause and hormone fluctuations--- says that she had her uterus taken out around age 60 and her ovaries out around age 60.  Also asked about her medications and any recent medication changes. She states that she has had no changes in any of the medications recently. Reviewed the following medications specifically: Prozac--- says that she has been on this a long time Klonopin--- says that she takes this only at bedtime--- has also been on this a long time Trazodone--- says that she only takes this at bedtime--- has also been on this a long time Reviewed that hydrocodone is on her medication list but she says that was given at the ER and she is not currently taking any of that.  Says she is using no illicit drugs or alcohol. No recent trauma or injury to the head.  Has had no headache. No vision change. No weakness in any extremity or area of her body. No slurred speech. Has noticed no other signs or symptoms.   Past Medical History  Diagnosis Date  . Asthma   . Hyperlipidemia   . Anxiety   . Depression   . Osteoporosis     right hip  Home Meds: Outpatient Prescriptions Prior to Visit  Medication Sig Dispense Refill  . beclomethasone (QVAR) 80 MCG/ACT inhaler Inhale 2 puffs into the lungs 2 (two) times daily. 1 Inhaler 12  . calcium-vitamin D (OSCAL WITH D) 500-200 MG-UNIT per tablet Take 1 tablet by mouth.    . celecoxib (CELEBREX) 200 MG capsule TAKE 1 CAPSULE (200 MG TOTAL) BY MOUTH DAILY. 30 capsule 9  . clonazePAM (KLONOPIN) 1 MG tablet Take 1 mg by mouth at bedtime.     Marland Kitchen FLUoxetine (PROZAC) 40 MG capsule Take 40 mg by mouth daily.     Marland Kitchen omeprazole (PRILOSEC) 40 MG capsule TAKE 1 CAPSULE (40 MG TOTAL) BY MOUTH DAILY. 30 capsule 11  . pravastatin (PRAVACHOL) 20 MG tablet TAKE 1 TABLET (20 MG TOTAL) BY MOUTH DAILY. 90 tablet 3  . PROAIR HFA 108 (90  BASE) MCG/ACT inhaler INHALE 2 PUFFS INTO THE LUNGS EVERY 6 (SIX) HOURS AS NEEDED FOR WHEEZING OR SHORTNESS OF BREATH. 8.5 Inhaler 5  . traZODone (DESYREL) 100 MG tablet 50 mg at bedtime.     . docusate sodium (COLACE) 100 MG capsule Take 1 capsule (100 mg total) by mouth every 12 (twelve) hours. (Patient not taking: Reported on 04/10/2015) 60 capsule 0  . HYDROcodone-acetaminophen (NORCO/VICODIN) 5-325 MG tablet Take 1 tablet by mouth every 4 (four) hours as needed for moderate pain. (Patient not taking: Reported on 04/10/2015) 10 tablet 0  . phenazopyridine (PYRIDIUM) 200 MG tablet Take 1 tablet (200 mg total) by mouth 3 (three) times daily. (Patient not taking: Reported on 04/10/2015) 6 tablet 0  . meloxicam (MOBIC) 15 MG tablet TAKE 1 TABLET BY MOUTH DAILY WITH FOOD AS NEEDED FOR PAIN AND SWELLING 30 tablet 3   No facility-administered medications prior to visit.    Allergies:  Allergies  Allergen Reactions  . Codeine Itching    Social History   Social History  . Marital Status: Divorced    Spouse Name: N/A  . Number of Children: N/A  . Years of Education: N/A   Occupational History  . Not on file.   Social History Main Topics  . Smoking status: Never Smoker   . Smokeless tobacco: Never Used  . Alcohol Use: 0.6 oz/week    1 Shots of liquor per week     Comment: occasional weekly drink  . Drug Use: No  . Sexual Activity: Yes     Comment: single   Other Topics Concern  . Not on file   Social History Narrative    Family History  Problem Relation Age of Onset  . Hyperlipidemia Mother   . ADD / ADHD Son   . Heart disease Maternal Grandmother   . Colon cancer Neg Hx   . Esophageal cancer Neg Hx   . Rectal cancer Neg Hx   . Stomach cancer Neg Hx      Review of Systems:  See HPI for pertinent ROS. All other ROS negative.    Physical Exam: Blood pressure 106/78, pulse 68, temperature 98.4 F (36.9 C), temperature source Oral, resp. rate 18, weight 157 lb (71.215 kg),  SpO2 97 %., Body mass index is 27.82 kg/(m^2). General: WNWD WF. Appears in no acute distress. Neck: Supple. No thyromegaly. No lymphadenopathy. No carotid bruit. Lungs: Clear bilaterally to auscultation without wheezes, rales, or rhonchi. Breathing is unlabored. Heart: RRR with S1 S2. No murmurs, rubs, or gallops. Musculoskeletal:  Strength and tone normal for age. Extremities/Skin: Warm and dry.  Neuro: Alert and oriented X  3. Moves all extremities spontaneously. Gait is normal. CNII-XII grossly in tact. 5/5 strength in all extremities. Romberg normal. Eye exam normal, no nystagmus.  Psych:  Responds to questions appropriately with a normal affect.     ASSESSMENT AND PLAN:  60 y.o. year old female with   1. Other fatigue - CBC with Differential/Platelet - COMPLETE METABOLIC PANEL WITH GFR - TSH - RPR - HIV antibody - Urinalysis, Routine w reflex microscopic (not at [facility]) - Mononucleosis screen  2. Impaired cognition - CBC with Differential/Platelet - COMPLETE METABOLIC PANEL WITH GFR - TSH - RPR - HIV antibody - Urinalysis, Routine w reflex microscopic (not at [facility]) - Mononucleosis screen  May need imaging of the brain but will start with labs first. Will check labs then follow-up with her when I get these results. If labs provide no explanation, will obtain CT head.   [address] [place], [place], [facility] 04/10/2015 12:49 PM

## 2015-04-11 ENCOUNTER — Other Ambulatory Visit: Payer: Self-pay | Admitting: Family Medicine

## 2015-04-11 DIAGNOSIS — R4182 Altered mental status, unspecified: Secondary | ICD-10-CM

## 2015-04-11 DIAGNOSIS — R410 Disorientation, unspecified: Secondary | ICD-10-CM

## 2015-04-11 LAB — RPR

## 2015-04-11 LAB — HIV ANTIBODY (ROUTINE TESTING W REFLEX): HIV: NONREACTIVE

## 2015-04-11 LAB — MONONUCLEOSIS SCREEN: HETEROPHILE, MONO SCREEN: NEGATIVE

## 2015-04-16 ENCOUNTER — Inpatient Hospital Stay: Admission: RE | Admit: 2015-04-16 | Payer: BLUE CROSS/BLUE SHIELD | Source: Ambulatory Visit

## 2015-04-17 ENCOUNTER — Other Ambulatory Visit: Payer: Self-pay | Admitting: Physician Assistant

## 2015-04-17 ENCOUNTER — Other Ambulatory Visit: Payer: Self-pay | Admitting: Family Medicine

## 2015-04-17 ENCOUNTER — Ambulatory Visit
Admission: RE | Admit: 2015-04-17 | Discharge: 2015-04-17 | Disposition: A | Discharge status: Home / Self Care | Payer: BLUE CROSS/BLUE SHIELD | Source: Ambulatory Visit | Attending: Physician Assistant | Admitting: Physician Assistant

## 2015-04-17 DIAGNOSIS — R410 Disorientation, unspecified: Secondary | ICD-10-CM

## 2015-04-17 MED ORDER — IOPAMIDOL (ISOVUE-300) INJECTION 61%
75.0000 mL | Freq: Once | INTRAVENOUS | Status: AC | PRN
  Administered 2015-04-17: 75 mL via INTRAVENOUS

## 2015-04-18 ENCOUNTER — Other Ambulatory Visit: Payer: Self-pay | Admitting: Family Medicine

## 2015-04-18 DIAGNOSIS — Q049 Congenital malformation of brain, unspecified: Secondary | ICD-10-CM

## 2015-04-18 DIAGNOSIS — R4182 Altered mental status, unspecified: Secondary | ICD-10-CM

## 2015-04-25 ENCOUNTER — Ambulatory Visit
Admission: RE | Admit: 2015-04-25 | Discharge: 2015-04-25 | Disposition: A | Discharge status: Home / Self Care | Payer: BLUE CROSS/BLUE SHIELD | Source: Ambulatory Visit | Attending: Physician Assistant | Admitting: Physician Assistant

## 2015-04-25 DIAGNOSIS — R4182 Altered mental status, unspecified: Secondary | ICD-10-CM

## 2015-04-25 DIAGNOSIS — Q049 Congenital malformation of brain, unspecified: Secondary | ICD-10-CM

## 2015-05-02 ENCOUNTER — Other Ambulatory Visit: Payer: Self-pay | Admitting: Family Medicine

## 2015-05-02 NOTE — Telephone Encounter (Signed)
Medication refilled per protocol. 

## 2015-05-05 ENCOUNTER — Other Ambulatory Visit: Payer: Self-pay | Admitting: Family Medicine

## 2015-05-05 NOTE — Telephone Encounter (Signed)
Medication refilled per protocol. 

## 2015-06-06 ENCOUNTER — Other Ambulatory Visit: Payer: Self-pay | Admitting: Family Medicine

## 2015-06-06 ENCOUNTER — Other Ambulatory Visit: Payer: BLUE CROSS/BLUE SHIELD

## 2015-06-06 DIAGNOSIS — M810 Age-related osteoporosis without current pathological fracture: Secondary | ICD-10-CM

## 2015-06-06 DIAGNOSIS — Z0000 Encounter for general adult medical examination without abnormal findings: Secondary | ICD-10-CM

## 2015-06-06 DIAGNOSIS — Z79899 Other long term (current) drug therapy: Secondary | ICD-10-CM

## 2015-06-06 DIAGNOSIS — E785 Hyperlipidemia, unspecified: Secondary | ICD-10-CM

## 2015-06-06 LAB — LIPID PANEL
CHOLESTEROL: 180 mg/dL (ref 125–200)
HDL: 49 mg/dL (ref >=46)
LDL CALC: 106 mg/dL (ref <130)
TRIGLYCERIDES: 127 mg/dL (ref <150)
Total CHOL/HDL Ratio: 3.7 Ratio (ref <=5.0)
VLDL: 25 mg/dL (ref <30)

## 2015-06-07 LAB — VITAMIN D 25 HYDROXY (VIT D DEFICIENCY, FRACTURES): Vit D, 25-Hydroxy: 30 ng/mL (ref 30–100)

## 2015-06-12 ENCOUNTER — Ambulatory Visit (INDEPENDENT_AMBULATORY_CARE_PROVIDER_SITE_OTHER): Payer: BLUE CROSS/BLUE SHIELD | Admitting: Family Medicine

## 2015-06-12 ENCOUNTER — Encounter: Payer: Self-pay | Admitting: Family Medicine

## 2015-06-12 VITALS — BP 108/64 | HR 80 | Temp 97.9°F | Resp 14 | Ht 63.0 in | Wt 154.0 lb

## 2015-06-12 DIAGNOSIS — Z0000 Encounter for general adult medical examination without abnormal findings: Secondary | ICD-10-CM

## 2015-06-12 DIAGNOSIS — M810 Age-related osteoporosis without current pathological fracture: Secondary | ICD-10-CM

## 2015-06-12 MED ORDER — ALENDRONATE SODIUM 70 MG PO TABS: 70.0000 mg | ORAL_TABLET | ORAL | Status: DC

## 2015-06-12 NOTE — Progress Notes (Signed)
Subjective:    Patient ID: Chelsea Griffith, female    DOB: Sep 02, 1955, 60 y.o.   MRN: [id number]  HPI  Review of Systems     Objective:   Physical Exam  Assessment & Plan:     Subjective:    Patient ID: Chelsea Griffith, female    DOB: 1955-06-18, 60 y.o.   MRN: [id number]  HPI Patient is here today for complete physical exam. Her mammogram is up-to-date. Her colonoscopy was performed last year and was clear. She is not due again until 2025. Her bone density was performed in 2015.  She states that she has a history of osteoporosis. However her insurance would not pay for Prolia. At the present time she is on no treatment for osteoporosis She has a history of a hysterectomy but still sees a gynecologist for pelvic and Pap exam. Her most recent lab work as listed below: Appointment on 06/06/2015  Component Date Value Ref Range Status  . Cholesterol 06/06/2015 180  125 - 200 mg/dL Final  . Triglycerides 06/06/2015 127  <150 mg/dL Final  . HDL 06/06/2015 49  >=46 mg/dL Final  . Total CHOL/HDL Ratio 06/06/2015 3.7  <=5.0 Ratio Final  . VLDL 06/06/2015 25  <30 mg/dL Final  . LDL Cholesterol 06/06/2015 106  <130 mg/dL Final   Comment:   Total Cholesterol/HDL Ratio:CHD Risk                        Coronary Heart Disease Risk Table                                        Men       Women          1/2 Average Risk              3.4        3.3              Average Risk              5.0        4.4           2X Average Risk              9.6        7.1           3X Average Risk             23.4       11.0 Use the calculated Patient Ratio above and the CHD Risk table  to determine the patient's CHD Risk.   . Vit D, 25-Hydroxy 06/06/2015 30  30 - 100 ng/mL Final   Comment: Vitamin D Status           25-OH Vitamin D        Deficiency                <20 ng/mL        Insufficiency         20 - 29 ng/mL        Optimal             > or = 30 ng/mL   For 25-OH Vitamin D testing on patients on  D2-supplementation and patients for whom quantitation of D2 and D3 fractions is required, the [facility] 25-OH VIT D, (D2,D3),  LC/MS/MS is recommended: order code [id number] (patients > 2 yrs).     Past Medical History  Diagnosis Date  . Asthma   . Hyperlipidemia   . Anxiety   . Depression   . Osteoporosis     right hip   Past Surgical History  Procedure Laterality Date  . Abdominal hysterectomy    . Elbow surgery       2times/1 time on left elbow  . Rotator cuff repair      right shoulder  . Knee surgery      right knee  . Partial hysterectomy      2 times  . Cesarean section       2 times  . Dilation and curettage of uterus      several   Current Outpatient Prescriptions on File Prior to Visit  Medication Sig Dispense Refill  . calcium-vitamin D (OSCAL WITH D) 500-200 MG-UNIT per tablet Take 1 tablet by mouth.    . celecoxib (CELEBREX) 200 MG capsule TAKE 1 CAPSULE (200 MG TOTAL) BY MOUTH DAILY. 90 capsule 1  . clonazePAM (KLONOPIN) 1 MG tablet Take 1 mg by mouth at bedtime.     Marland Kitchen FLUoxetine (PROZAC) 40 MG capsule Take 40 mg by mouth daily.     Marland Kitchen omeprazole (PRILOSEC) 40 MG capsule TAKE 1 CAPSULE (40 MG TOTAL) BY MOUTH DAILY. 30 capsule 11  . pravastatin (PRAVACHOL) 20 MG tablet TAKE 1 TABLET (20 MG TOTAL) BY MOUTH DAILY. 90 tablet 3  . PROAIR HFA 108 (90 BASE) MCG/ACT inhaler INHALE 2 PUFFS INTO THE LUNGS EVERY 6 (SIX) HOURS AS NEEDED FOR WHEEZING OR SHORTNESS OF BREATH. 8.5 Inhaler 5  . QVAR 80 MCG/ACT inhaler INHALE 2 PUFFS INTO THE LUNGS 2 (TWO) TIMES DAILY. 8.7 g 11  . traZODone (DESYREL) 100 MG tablet 50 mg at bedtime.      No current facility-administered medications on file prior to visit.   Allergies  Allergen Reactions  . Codeine Itching   Social History   Social History  . Marital Status: Divorced    Spouse Name: N/A  . Number of Children: N/A  . Years of Education: N/A   Occupational History  . Not on file.   Social History Main Topics  .  Smoking status: Never Smoker   . Smokeless tobacco: Never Used  . Alcohol Use: 0.6 oz/week    1 Shots of liquor per week     Comment: occasional weekly drink  . Drug Use: No  . Sexual Activity: Yes     Comment: single   Other Topics Concern  . Not on file   Social History Narrative   Family History  Problem Relation Age of Onset  . Hyperlipidemia Mother   . ADD / ADHD Son   . Heart disease Maternal Grandmother   . Colon cancer Neg Hx   . Esophageal cancer Neg Hx   . Rectal cancer Neg Hx   . Stomach cancer Neg Hx       Review of Systems  All other systems reviewed and are negative.      Objective:   Physical Exam  Constitutional: She is oriented to person, place, and time. She appears well-developed and well-nourished. No distress.  HENT:  Head: Normocephalic and atraumatic.  Right Ear: External ear normal.  Left Ear: External ear normal.  Nose: Nose normal.  Mouth/Throat: Oropharynx is clear and moist. No oropharyngeal exudate.  Eyes: Conjunctivae and EOM are normal. Pupils are equal, round, and  reactive to light. Right eye exhibits no discharge. Left eye exhibits no discharge.  Neck: Normal range of motion. Neck supple. No JVD present. No tracheal deviation present. No thyromegaly present.  Cardiovascular: Normal rate, regular rhythm, normal heart sounds and intact distal pulses.  Exam reveals no gallop and no friction rub.   No murmur heard. Pulmonary/Chest: Effort normal and breath sounds normal. No stridor. No respiratory distress. She has no wheezes. She has no rales. She exhibits no tenderness.  Abdominal: Soft. Bowel sounds are normal. She exhibits no distension and no mass. There is no tenderness. There is no rebound and no guarding.  Musculoskeletal: Normal range of motion. She exhibits no edema or tenderness.  Lymphadenopathy:    She has no cervical adenopathy.  Neurological: She is alert and oriented to person, place, and time. She has normal reflexes. No  cranial nerve deficit. Coordination normal.  Skin: Skin is warm. No rash noted. She is not diaphoretic. No erythema. No pallor.  Psychiatric: She has a normal mood and affect. Her behavior is normal. Judgment and thought content normal.  Vitals reviewed.         Assessment & Plan:  Osteoporosis - Plan: alendronate (FOSAMAX) 70 MG tablet Gen med exam  Physical exam is normal.I would like her to start Fosamax 70 mg once a week for osteoporosis. Repeat bone density test in 2018. Mammogram is due in July. Pap smear is unnecessary. Colonoscopy is up-to-date. Lab work is excellent. We did discuss hepatitis C screening the patient declines as she does not have any risk factors. We also discussed the shingles vaccine. She declines a flu shot. She will think about the shingles vaccine

## 2015-06-17 ENCOUNTER — Other Ambulatory Visit: Payer: Self-pay | Admitting: Family Medicine

## 2015-06-17 NOTE — Telephone Encounter (Signed)
Refill appropriate and filled per protocol. 

## 2015-07-03 ENCOUNTER — Ambulatory Visit: Payer: BLUE CROSS/BLUE SHIELD | Admitting: Family Medicine

## 2015-07-19 ENCOUNTER — Encounter: Payer: Self-pay | Admitting: Family Medicine

## 2015-11-08 ENCOUNTER — Encounter: Payer: Self-pay | Admitting: Family Medicine

## 2015-12-11 ENCOUNTER — Other Ambulatory Visit: Payer: Self-pay | Admitting: Family Medicine

## 2015-12-15 ENCOUNTER — Encounter: Payer: Self-pay | Admitting: Family Medicine

## 2016-02-24 ENCOUNTER — Other Ambulatory Visit: Payer: Self-pay | Admitting: Family Medicine

## 2016-04-25 ENCOUNTER — Other Ambulatory Visit: Payer: Self-pay | Admitting: Family Medicine

## 2016-05-20 ENCOUNTER — Other Ambulatory Visit: Payer: Self-pay | Admitting: Family Medicine

## 2016-06-30 ENCOUNTER — Other Ambulatory Visit: Payer: Self-pay | Admitting: Family Medicine

## 2016-06-30 MED ORDER — PRAVASTATIN SODIUM 20 MG PO TABS: 20.0000 mg | ORAL_TABLET | Freq: Every day | ORAL | 0 refills | Status: DC

## 2016-08-16 ENCOUNTER — Other Ambulatory Visit: Payer: Self-pay | Admitting: Family Medicine

## 2016-08-16 MED ORDER — OMEPRAZOLE 40 MG PO CPDR: DELAYED_RELEASE_CAPSULE | ORAL | 0 refills | Status: DC

## 2016-08-16 MED ORDER — PRAVASTATIN SODIUM 20 MG PO TABS: 20.0000 mg | ORAL_TABLET | Freq: Every day | ORAL | 0 refills | Status: DC

## 2016-08-19 ENCOUNTER — Other Ambulatory Visit: Payer: Self-pay | Admitting: *Deleted

## 2016-09-21 ENCOUNTER — Other Ambulatory Visit: Payer: Self-pay | Admitting: Family Medicine

## 2016-12-12 ENCOUNTER — Other Ambulatory Visit: Payer: Self-pay | Admitting: Family Medicine

## 2016-12-28 ENCOUNTER — Other Ambulatory Visit: Payer: Self-pay | Admitting: Family Medicine

## 2017-04-04 ENCOUNTER — Ambulatory Visit (INDEPENDENT_AMBULATORY_CARE_PROVIDER_SITE_OTHER): Admitting: Family Medicine

## 2017-04-04 ENCOUNTER — Other Ambulatory Visit: Payer: Self-pay

## 2017-04-04 ENCOUNTER — Encounter: Payer: Self-pay | Admitting: Family Medicine

## 2017-04-04 VITALS — BP 128/72 | HR 58 | Temp 97.8°F | Resp 14 | Ht 63.0 in | Wt 155.0 lb

## 2017-04-04 DIAGNOSIS — E785 Hyperlipidemia, unspecified: Secondary | ICD-10-CM

## 2017-04-04 DIAGNOSIS — M810 Age-related osteoporosis without current pathological fracture: Secondary | ICD-10-CM | POA: Diagnosis not present

## 2017-04-04 DIAGNOSIS — Z0000 Encounter for general adult medical examination without abnormal findings: Secondary | ICD-10-CM

## 2017-04-04 DIAGNOSIS — Z23 Encounter for immunization: Secondary | ICD-10-CM | POA: Diagnosis not present

## 2017-04-04 DIAGNOSIS — Z1159 Encounter for screening for other viral diseases: Secondary | ICD-10-CM

## 2017-04-04 DIAGNOSIS — K219 Gastro-esophageal reflux disease without esophagitis: Secondary | ICD-10-CM

## 2017-04-04 MED ORDER — CLONAZEPAM 0.5 MG PO TABS: 0.2500 mg | ORAL_TABLET | Freq: Two times a day (BID) | ORAL | 1 refills | Status: DC | PRN

## 2017-04-04 NOTE — Progress Notes (Signed)
Subjective:    Patient ID: Chelsea Griffith, female    DOB: 23-Mar-1955, 62 y.o.   MRN: [id number]  HPI Patient is here today for complete physical exam.  She tragically lost her husband earlier this month. She is still in shock and grieving his loss. Obviously her depression screen is off the charts. She reports insomnia, anhedonia, trouble concentrating etc. She is interested in using a very low-dose Klonopin sparingly during the daytime as needed to help "take the edge off". She takes 1 mg of Klonopin at night to help her sleep. She would be interested in taking half of a half milligram just on occasion to help calm her nerves. I believe it's reasonable. Her colonoscopy was in 2016 and is up-to-date. She had her mammogram in 2018 at her gynecologist along with her Pap smear so this is up-to-date. She is due for her flu shot. Immunization History  Administered Date(s) Administered  . Tdap 04/13/2013   Past Medical History:  Diagnosis Date  . Anxiety   . Asthma   . Depression   . Hyperlipidemia   . Osteoporosis    right hip   Past Surgical History:  Procedure Laterality Date  . ABDOMINAL HYSTERECTOMY    . CESAREAN SECTION      2 times  . DILATION AND CURETTAGE OF UTERUS     several  . ELBOW SURGERY      2times/1 time on left elbow  . KNEE SURGERY     right knee  . PARTIAL HYSTERECTOMY     2 times  . ROTATOR CUFF REPAIR     right shoulder   Current Outpatient Medications on File Prior to Visit  Medication Sig Dispense Refill  . calcium-vitamin D (OSCAL WITH D) 500-200 MG-UNIT per tablet Take 1 tablet by mouth.    . clonazePAM (KLONOPIN) 1 MG tablet Take 1 mg by mouth at bedtime.     Marland Kitchen FLUoxetine (PROZAC) 40 MG capsule Take 40 mg by mouth daily.     Marland Kitchen PROAIR HFA 108 (90 Base) MCG/ACT inhaler INHALE 2 PUFFS INTO THE LUNGS EVERY 6 (SIX) HOURS AS NEEDED FOR WHEEZING OR SHORTNESS OF BREATH. 8.5 Inhaler 0  . traZODone (DESYREL) 100 MG tablet 50 mg at bedtime.     Marland Kitchen alendronate  (FOSAMAX) 70 MG tablet Take 1 tablet (70 mg total) by mouth every 7 (seven) days. Take with a full glass of water on an empty stomach. (Patient not taking: Reported on 04/04/2017) 4 tablet 11  . celecoxib (CELEBREX) 200 MG capsule TAKE ONE CAPSULE BY MOUTH EVERY DAY (Patient not taking: Reported on 04/04/2017) 90 capsule 0  . omeprazole (PRILOSEC) 40 MG capsule TAKE 1 CAPSULE BY MOUTH EVERY DAY (Patient not taking: Reported on 04/04/2017) 30 capsule 0  . pravastatin (PRAVACHOL) 20 MG tablet Take 1 tablet (20 mg total) by mouth daily. (Patient not taking: Reported on 04/04/2017) 30 tablet 0  . QVAR 80 MCG/ACT inhaler INHALE 2 PUFFS INTO THE LUNGS 2 (TWO) TIMES DAILY. (Patient not taking: Reported on 04/04/2017) 8.7 g 11   No current facility-administered medications on file prior to visit.    Allergies  Allergen Reactions  . Codeine Itching   Social History   Socioeconomic History  . Marital status: Widowed    Spouse name: Not on file  . Number of children: Not on file  . Years of education: Not on file  . Highest education level: Not on file  Social Needs  . Financial resource strain:  Not on file  . Food insecurity - worry: Not on file  . Food insecurity - inability: Not on file  . Transportation needs - medical: Not on file  . Transportation needs - non-medical: Not on file  Occupational History  . Not on file  Tobacco Use  . Smoking status: Never Smoker  . Smokeless tobacco: Never Used  Substance and Sexual Activity  . Alcohol use: Yes    Alcohol/week: 0.6 oz    Types: 1 Shots of liquor per week    Comment: occasional weekly drink  . Drug use: No  . Sexual activity: Yes    Comment: single  Other Topics Concern  . Not on file  Social History Narrative  . Not on file   Family History  Problem Relation Age of Onset  . Hyperlipidemia Mother   . ADD / ADHD Son   . Heart disease Maternal Grandmother   . Colon cancer Neg Hx   . Esophageal cancer Neg Hx   . Rectal cancer Neg  Hx   . Stomach cancer Neg Hx       Review of Systems  All other systems reviewed and are negative.      Objective:   Physical Exam  Constitutional: She is oriented to person, place, and time. She appears well-developed and well-nourished. No distress.  HENT:  Head: Normocephalic and atraumatic.  Right Ear: External ear normal.  Left Ear: External ear normal.  Nose: Nose normal.  Mouth/Throat: Oropharynx is clear and moist. No oropharyngeal exudate.  Eyes: Conjunctivae and EOM are normal. Pupils are equal, round, and reactive to light. Right eye exhibits no discharge. Left eye exhibits no discharge. No scleral icterus.  Neck: Normal range of motion. Neck supple. No JVD present. No tracheal deviation present. No thyromegaly present.  Cardiovascular: Normal rate, regular rhythm, normal heart sounds and intact distal pulses. Exam reveals no gallop and no friction rub.  No murmur heard. Pulmonary/Chest: Effort normal and breath sounds normal. No stridor. No respiratory distress. She has no wheezes. She has no rales. She exhibits no tenderness.  Abdominal: Soft. Bowel sounds are normal. She exhibits no distension and no mass. There is no tenderness. There is no rebound and no guarding.  Musculoskeletal: Normal range of motion. She exhibits no edema, tenderness or deformity.  Lymphadenopathy:    She has no cervical adenopathy.  Neurological: She is alert and oriented to person, place, and time. She has normal reflexes. She displays normal reflexes. No cranial nerve deficit. She exhibits normal muscle tone. Coordination normal.  Skin: Skin is warm. No rash noted. She is not diaphoretic. No erythema. No pallor.  Psychiatric: She has a normal mood and affect. Her behavior is normal. Judgment and thought content normal.  Vitals reviewed.         Assessment & Plan:  Routine general medical examination at a health care facility  Osteoporosis without current pathological fracture,  unspecified osteoporosis type  Elevated lipids  Gastroesophageal reflux disease without esophagitis  I offered my deepest condolences over the death of her husband. I will screen the patient for hepatitis C. Pap smear, colonoscopy, mammogram are up-to-date. Will check CBC, CMP, fasting lipid panel. Also discussed the shingles vaccine. She can use one half of a Klonopin 0.5 mg every 8 hours as needed for anxiety or stress. I gave her 30 tablets to use sparingly as she grieves the death of her husband.  She denies any suicidal ideation.

## 2017-04-04 NOTE — Addendum Note (Signed)
Addended by: Shary Decamp B on: 04/04/2017 11:54 AM   Modules accepted: Orders

## 2017-04-05 LAB — CBC WITH DIFFERENTIAL/PLATELET
Basophils Absolute: 20 cells/uL (ref 0–200)
Basophils Relative: 0.4 %
Eosinophils Absolute: 70 cells/uL (ref 15–500)
Eosinophils Relative: 1.4 %
HEMATOCRIT: 39.2 % (ref 35.0–45.0)
HEMOGLOBIN: 13.5 g/dL (ref 11.7–15.5)
LYMPHS ABS: 1415 {cells}/uL (ref 850–3900)
MCH: 29.1 pg (ref 27.0–33.0)
MCHC: 34.4 g/dL (ref 32.0–36.0)
MCV: 84.5 fL (ref 80.0–100.0)
MPV: 9.7 fL (ref 7.5–12.5)
Monocytes Relative: 5 %
NEUTROS PCT: 64.9 %
Neutro Abs: 3245 cells/uL (ref 1500–7800)
Platelets: 185 10*3/uL (ref 140–400)
RBC: 4.64 10*6/uL (ref 3.80–5.10)
RDW: 13.2 % (ref 11.0–15.0)
Total Lymphocyte: 28.3 %
WBC mixed population: 250 cells/uL (ref 200–950)
WBC: 5 10*3/uL (ref 3.8–10.8)

## 2017-04-05 LAB — COMPREHENSIVE METABOLIC PANEL
AG Ratio: 2 (calc) (ref 1.0–2.5)
ALBUMIN MSPROF: 4.4 g/dL (ref 3.6–5.1)
ALKALINE PHOSPHATASE (APISO): 62 U/L (ref 33–130)
ALT: 9 U/L (ref 6–29)
AST: 14 U/L (ref 10–35)
BILIRUBIN TOTAL: 0.5 mg/dL (ref 0.2–1.2)
BUN: 16 mg/dL (ref 7–25)
CALCIUM: 9.7 mg/dL (ref 8.6–10.4)
CO2: 31 mmol/L (ref 20–32)
CREATININE: 0.88 mg/dL (ref 0.50–0.99)
Chloride: 106 mmol/L (ref 98–110)
Globulin: 2.2 g/dL (calc) (ref 1.9–3.7)
Glucose, Bld: 104 mg/dL — ABNORMAL HIGH (ref 65–99)
POTASSIUM: 4.8 mmol/L (ref 3.5–5.3)
Sodium: 143 mmol/L (ref 135–146)
Total Protein: 6.6 g/dL (ref 6.1–8.1)

## 2017-04-05 LAB — LIPID PANEL
CHOLESTEROL: 220 mg/dL — AB (ref <200)
HDL: 60 mg/dL (ref >50)
LDL CHOLESTEROL (CALC): 144 mg/dL — AB
Non-HDL Cholesterol (Calc): 160 mg/dL (calc) — ABNORMAL HIGH (ref <130)
Total CHOL/HDL Ratio: 3.7 (calc) (ref <5.0)
Triglycerides: 66 mg/dL (ref <150)

## 2017-04-05 LAB — HEPATITIS C ANTIBODY
Hepatitis C Ab: NONREACTIVE
SIGNAL TO CUT-OFF: 0.01 (ref <1.00)

## 2017-04-05 LAB — TSH: TSH: 1.76 mIU/L (ref 0.40–4.50)

## 2017-04-08 ENCOUNTER — Other Ambulatory Visit: Payer: Self-pay | Admitting: Family Medicine

## 2017-04-08 MED ORDER — PANTOPRAZOLE SODIUM 40 MG PO TBEC: 40.0000 mg | DELAYED_RELEASE_TABLET | Freq: Every day | ORAL | 3 refills | Status: DC

## 2017-04-08 MED ORDER — PRAVASTATIN SODIUM 20 MG PO TABS: 20.0000 mg | ORAL_TABLET | Freq: Every day | ORAL | 1 refills | Status: DC

## 2017-10-09 ENCOUNTER — Other Ambulatory Visit: Payer: Self-pay | Admitting: Family Medicine

## 2018-01-27 IMAGING — MR MR MRA HEAD W/O CM
1 series · 22 of 48 positions shown · non-contrast
Comparison: Head CT 04/17/2015

CLINICAL DATA: Altered mental status. Episode of confusion 2 weeks
ago. Suspected small venous anomaly in the left frontal lobe on
recent CT.

EXAM:
MRA HEAD WITHOUT CONTRAST
TECHNIQUE: Angiographic images of the Circle of Willis were obtained using MRA
technique without intravenous contrast.

[Series 3: [id number] · axial · 0.7mm · 0.35mm/px · z∈[-34,+57]mm · 22 of 136 slices shown]
[im 1/136]
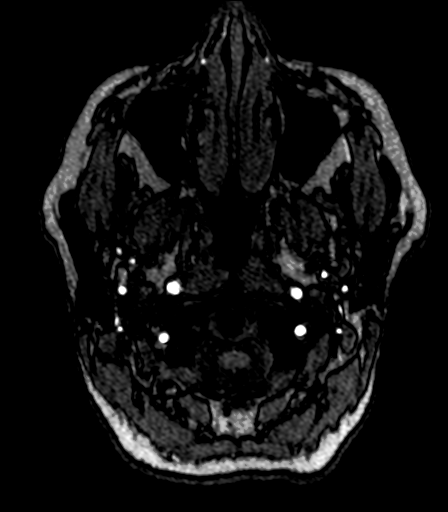
[im 3/136]
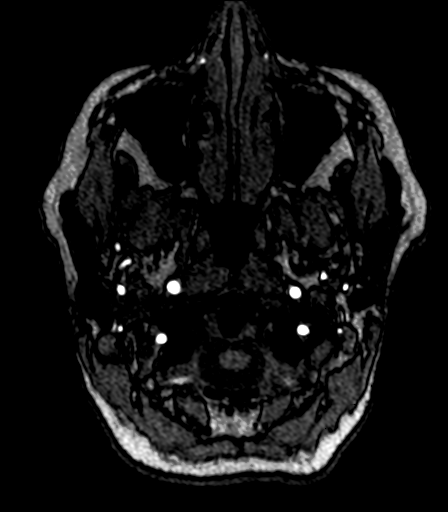
[im 6/136]
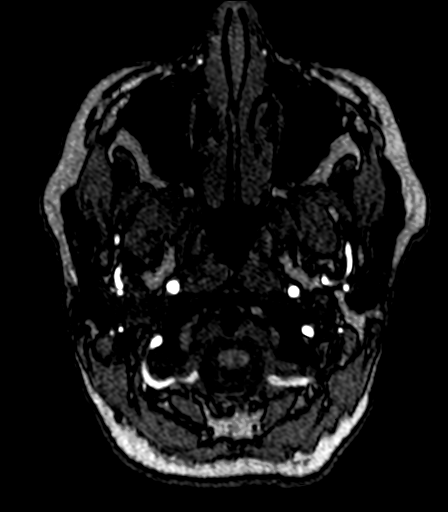
[im 9/136]
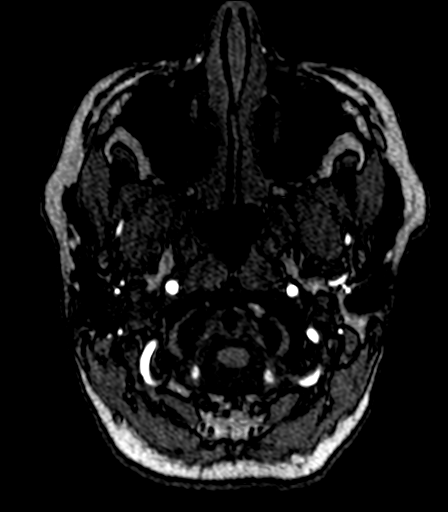
[im 12/136]
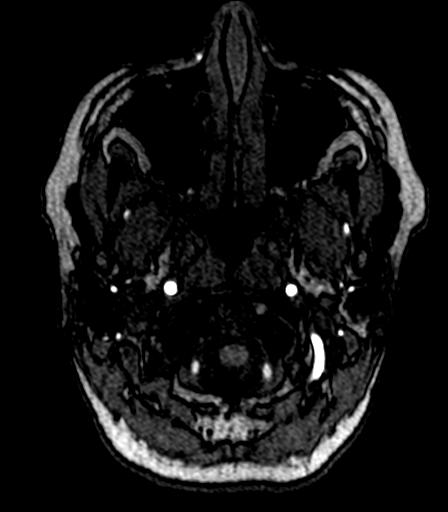
[im 15/136]
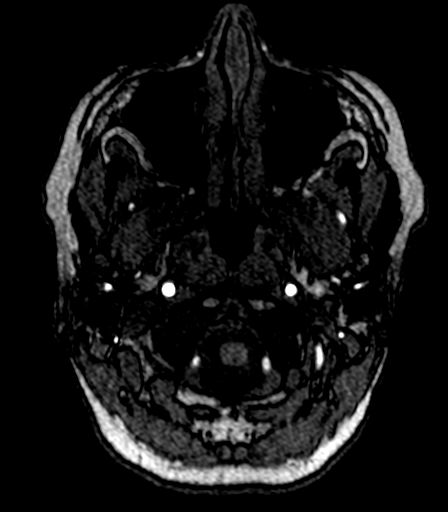
[im 18/136]
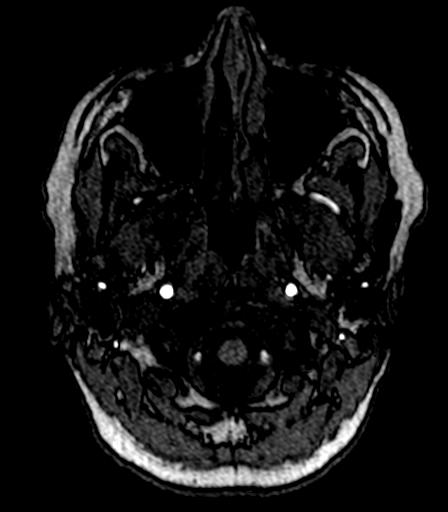
[im 21/136]
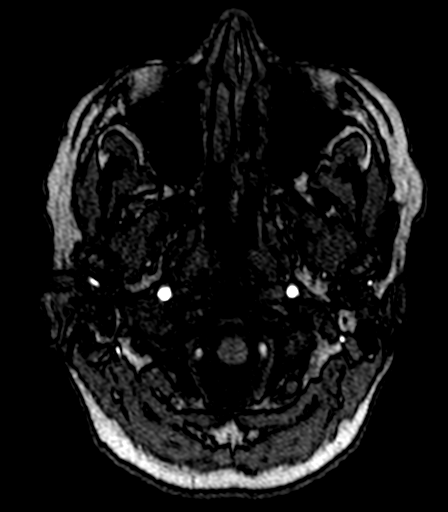
[im 23/136]
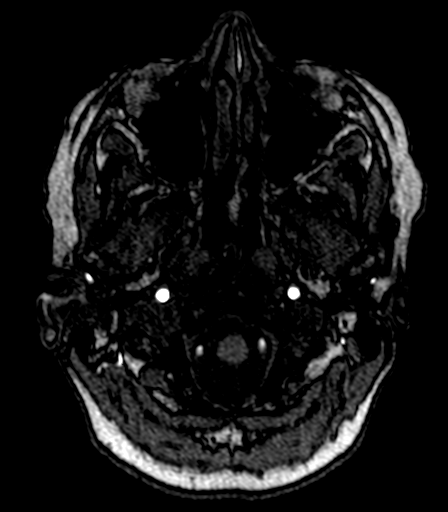
[im 26/136]
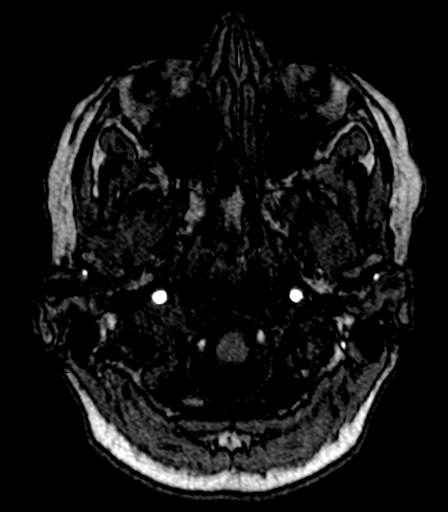
[im 29/136]
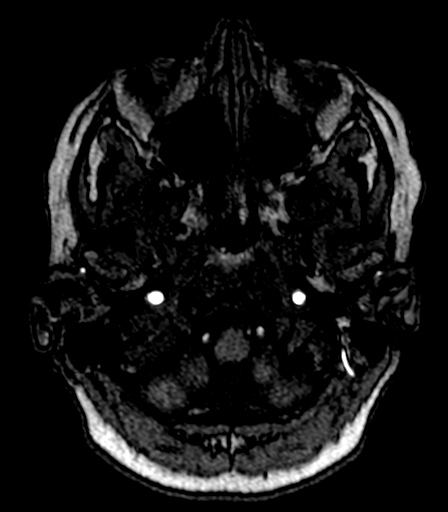
[im 32/136]
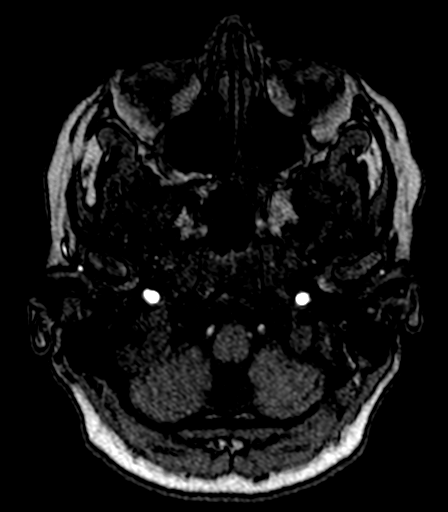
[im 35/136]
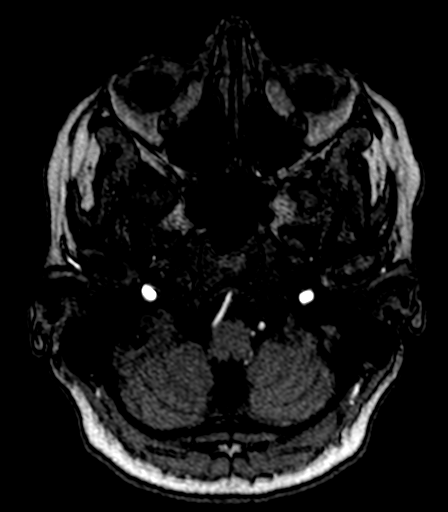
[im 38/136]
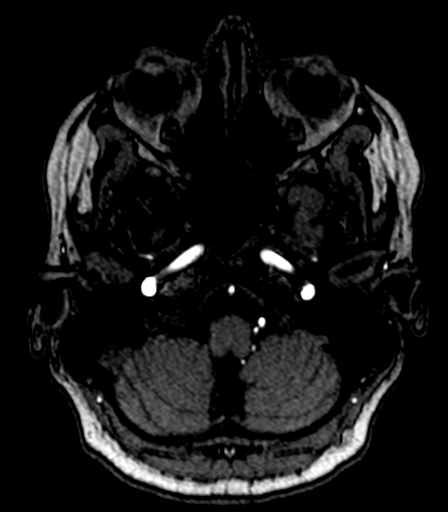
[im 44/136]
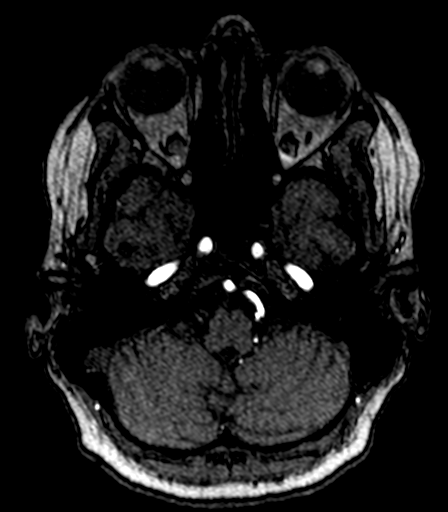
[im 61/136]
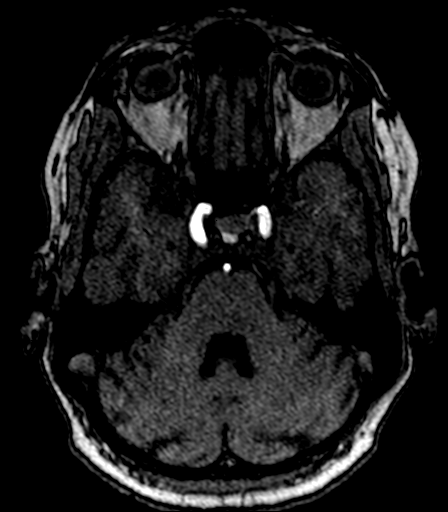
[im 69/136]
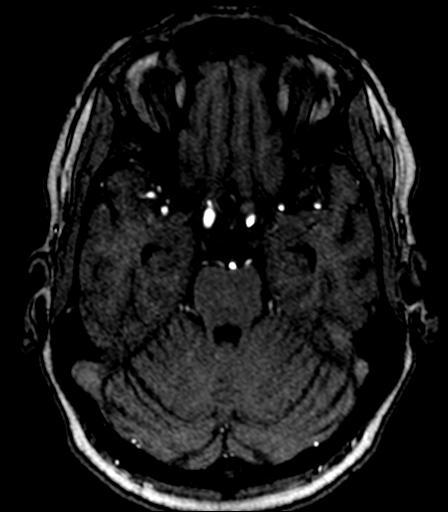
[im 78/136]
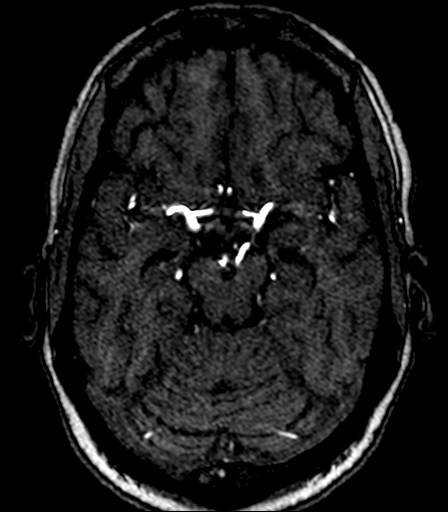
[im 95/136]
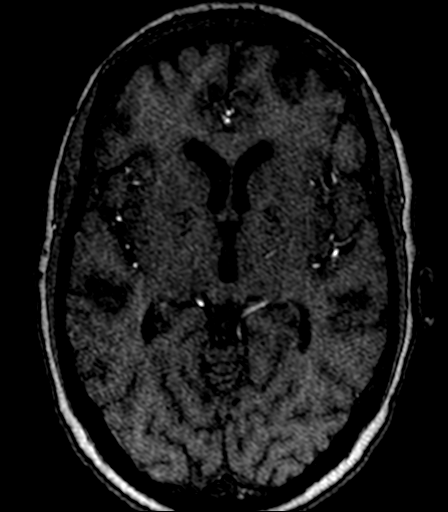
[im 113/136]
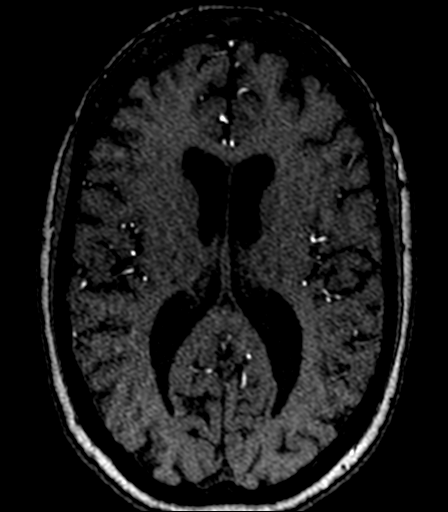
[im 115/136]
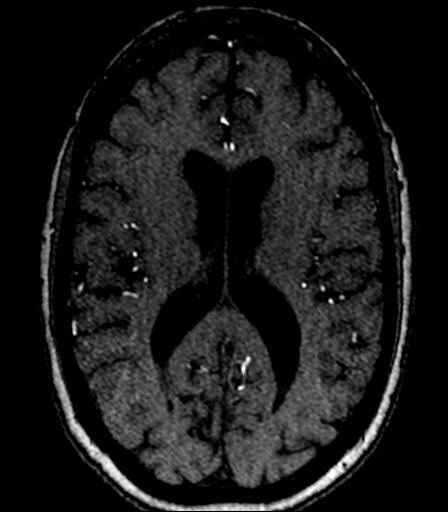
[im 130/136]
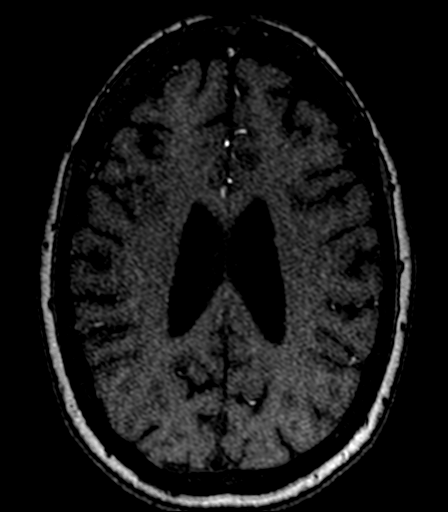

[22 of 48 positions shown; findings below may reference images not displayed]

FINDINGS: A routine head MRA was performed, and this does not cover the high
left frontal lobe in the area of linear enhancement/possible venous
anomaly noted on the prior CT.

The visualized distal vertebral arteries are widely patent and
codominant. Left PICA origin is patent. Right AICA is dominant. SCA
origins are patent. Basilar artery is patent without stenosis. There
is a fetal type origin of the right PCA. A small left posterior
communicating artery is also present. PCAs are patent without
evidence of significant proximal stenosis. There is asymmetric,
mild-to-moderate distal PCA branch vessel attenuation on the left.

The internal carotid arteries are patent from skullbase to carotid
termini without evidence of stenosis. ACAs and MCAs are patent
without evidence of significant stenosis or major branch occlusion.
Mild fullness in the near the right A1 -A2 junction/anterior
communicating region is felt to be due to motion artifact without a
discrete aneurysm identified.
IMPRESSION: 1. No evidence of significant proximal intracranial stenosis or
major branch occlusion. Mild asymmetric left PCA branch vessel
attenuation.
2. The area of linear enhancement in the high left frontal lobe on
the prior CT was not covered on this routine head MRA. However,
given that the appearance on CT is suggestive of a venous anomaly,
arterial imaging is likely to be normal through this region. If
further evaluation of this area is clinically desired, head MRI (not
MRA) without and with contrast could be performed.

## 2018-03-07 ENCOUNTER — Ambulatory Visit (INDEPENDENT_AMBULATORY_CARE_PROVIDER_SITE_OTHER): Admitting: Family Medicine

## 2018-03-07 ENCOUNTER — Encounter: Payer: Self-pay | Admitting: Family Medicine

## 2018-03-07 VITALS — BP 120/78 | HR 72 | Temp 98.7°F | Resp 20 | Wt 149.6 lb

## 2018-03-07 DIAGNOSIS — J45901 Unspecified asthma with (acute) exacerbation: Secondary | ICD-10-CM | POA: Diagnosis not present

## 2018-03-07 DIAGNOSIS — J069 Acute upper respiratory infection, unspecified: Secondary | ICD-10-CM

## 2018-03-07 MED ORDER — ALBUTEROL SULFATE (2.5 MG/3ML) 0.083% IN NEBU
2.5000 mg | INHALATION_SOLUTION | Freq: Once | RESPIRATORY_TRACT | Status: AC
  Administered 2018-03-07: 2.5 mg via RESPIRATORY_TRACT

## 2018-03-07 MED ORDER — MONTELUKAST SODIUM 10 MG PO TABS: 10.0000 mg | ORAL_TABLET | Freq: Every day | ORAL | 3 refills | Status: DC

## 2018-03-07 MED ORDER — AZITHROMYCIN 250 MG PO TABS: ORAL_TABLET | ORAL | 0 refills | Status: DC

## 2018-03-07 MED ORDER — PREDNISONE 20 MG PO TABS: 40.0000 mg | ORAL_TABLET | Freq: Every day | ORAL | 0 refills | Status: AC

## 2018-03-07 MED ORDER — ALBUTEROL SULFATE 108 (90 BASE) MCG/ACT IN AEPB: 2.0000 | INHALATION_SPRAY | RESPIRATORY_TRACT | 1 refills | Status: DC | PRN

## 2018-03-07 MED ORDER — HYDROCOD POLST-CPM POLST ER 10-8 MG/5ML PO SUER: 5.0000 mL | Freq: Every evening | ORAL | 0 refills | Status: DC | PRN

## 2018-03-07 MED ORDER — BENZONATATE 100 MG PO CAPS: 100.0000 mg | ORAL_CAPSULE | Freq: Three times a day (TID) | ORAL | 0 refills | Status: DC | PRN

## 2018-03-07 MED ORDER — IPRATROPIUM-ALBUTEROL 0.5-2.5 (3) MG/3ML IN SOLN
3.0000 mL | Freq: Once | RESPIRATORY_TRACT | Status: AC
  Administered 2018-03-07: 3 mL via RESPIRATORY_TRACT

## 2018-03-07 NOTE — Progress Notes (Signed)
Patient ID: MELEA PREZIOSO, female    DOB: 25-Aug-1955, 62 y.o.   MRN: [id number]  PCP: Susy Frizzle, MD  Chief Complaint  Patient presents with  . URI    Subjective:   KIMIKA STREATER is a 62 y.o. female, presents to clinic with CC of URI sx and severe coughing/asthma exacerbation.  Sx started 4 days ago, she described head cold and chest cold.  She has profuse nasal drainage and postnasal drip, scratchy throat she has difficulty talking because seems to trigger her coughing fits which are frequent and she is even very hesitant to try and talk when she first is brought back and put in the exam room.  She denies any fever or sweats but she does have soreness in her chest from coughing, denies any hemoptysis, is productive at times, clear to yellow or green sputum.  She is not on any allergy medications, Singulair or maintenance inhaler for asthma she says she has a pro-air but she has not even tried it.  She cannot remember the last time she had steroids for an asthma exacerbation, and she was hospitalized one time for asthma complications but never intubated never needed epi. She denies any posttussive emesis, fever, chills, sweats, fatigue.  She states that she is able to go out and take care of her horses and do what she needs to do but she does not want clean the floors at home.  She is sleeping okay 2 nights ago she slept fine but her boyfriend said kept him up all night and then last night she was up frequently with coughing.  She states that Tussionex has worked in the past to help. Denies any rash, sick contacts, body aches, headache, sinus pain pressure, neck pain, near-syncope, chest pain, palpitations, orthopnea.   Patient Active Problem List   Diagnosis Date Noted  . Osteoporosis 04/08/2014  . Elevated lipids 10/19/2013  . GERD (gastroesophageal reflux disease) 10/19/2013  . Chest pain 10/19/2013     Prior to Admission medications   Medication Sig Start Date End Date  Taking? Authorizing Provider  alendronate (FOSAMAX) 70 MG tablet Take 1 tablet (70 mg total) by mouth every 7 (seven) days. Take with a full glass of water on an empty stomach. Patient not taking: Reported on 04/04/2017 06/12/15   Susy Frizzle, MD  calcium-vitamin D (OSCAL WITH D) 500-200 MG-UNIT per tablet Take 1 tablet by mouth.    [provider]  celecoxib (CELEBREX) 200 MG capsule TAKE ONE CAPSULE BY MOUTH EVERY DAY Patient not taking: Reported on 04/04/2017 05/20/16   Susy Frizzle, MD  clonazePAM (KLONOPIN) 0.5 MG tablet Take 0.5 tablets (0.25 mg total) by mouth 2 (two) times daily as needed for anxiety. 04/04/17   Susy Frizzle, MD  clonazePAM (KLONOPIN) 1 MG tablet Take 1 mg by mouth at bedtime.  04/10/13   [provider]  FLUoxetine (PROZAC) 40 MG capsule Take 40 mg by mouth daily.  04/04/13   [provider]  pantoprazole (PROTONIX) 40 MG tablet Take 1 tablet (40 mg total) by mouth daily. 04/08/17   Susy Frizzle, MD  pravastatin (PRAVACHOL) 20 MG tablet TAKE 1 TABLET BY MOUTH EVERY DAY 10/10/17   Susy Frizzle, MD  PROAIR HFA 108 [phone] Base) MCG/ACT inhaler INHALE 2 PUFFS INTO THE LUNGS EVERY 6 (SIX) HOURS AS NEEDED FOR WHEEZING OR SHORTNESS OF BREATH. 02/24/16   Susy Frizzle, MD  QVAR 80 MCG/ACT inhaler INHALE 2  PUFFS INTO THE LUNGS 2 (TWO) TIMES DAILY. Patient not taking: Reported on 04/04/2017 05/02/15   Susy Frizzle, MD  traZODone (DESYREL) 100 MG tablet 50 mg at bedtime.  03/02/13   [provider]     Allergies  Allergen Reactions  . Codeine Itching     Family History  Problem Relation Age of Onset  . Hyperlipidemia Mother   . ADD / ADHD Son   . Heart disease Maternal Grandmother   . Colon cancer Neg Hx   . Esophageal cancer Neg Hx   . Rectal cancer Neg Hx   . Stomach cancer Neg Hx      Social History   Socioeconomic History  . Marital status: Widowed    Spouse name: Not on file  . Number of children: Not on  file  . Years of education: Not on file  . Highest education level: Not on file  Occupational History  . Not on file  Social Needs  . Financial resource strain: Not on file  . Food insecurity:    Worry: Not on file    Inability: Not on file  . Transportation needs:    Medical: Not on file    Non-medical: Not on file  Tobacco Use  . Smoking status: Never Smoker  . Smokeless tobacco: Never Used  Substance and Sexual Activity  . Alcohol use: Yes    Alcohol/week: 1.0 standard drinks    Types: 1 Shots of liquor per week    Comment: occasional weekly drink  . Drug use: No  . Sexual activity: Yes    Comment: single  Lifestyle  . Physical activity:    Days per week: Not on file    Minutes per session: Not on file  . Stress: Not on file  Relationships  . Social connections:    Talks on phone: Not on file    Gets together: Not on file    Attends religious service: Not on file    Active member of club or organization: Not on file    Attends meetings of clubs or organizations: Not on file    Relationship status: Not on file  . Intimate partner violence:    Fear of current or ex partner: Not on file    Emotionally abused: Not on file    Physically abused: Not on file    Forced sexual activity: Not on file  Other Topics Concern  . Not on file  Social History Narrative  . Not on file     Review of Systems  Constitutional: Negative.   HENT: Negative.   Eyes: Negative.   Respiratory: Negative.   Cardiovascular: Negative.   Gastrointestinal: Negative.   Endocrine: Negative.   Genitourinary: Negative.   Musculoskeletal: Negative.   Skin: Negative.   Allergic/Immunologic: Negative.   Neurological: Negative.   Hematological: Negative.   Psychiatric/Behavioral: Negative.   All other systems reviewed and are negative.      Objective:    Vitals:   03/07/18 1014  BP: 120/78  Pulse: 72  Resp: 20  SpO2: 95%  Weight: 149 lb 9.6 oz (67.9 kg)      Physical  Exam Vitals signs and nursing note reviewed.  Constitutional:      General: She is not in acute distress.    Appearance: She is well-developed. She is not ill-appearing, toxic-appearing or diaphoretic.  HENT:     Head: Normocephalic and atraumatic.     Right Ear: Hearing and external ear normal. There is  impacted cerumen.     Left Ear: Hearing and external ear normal. There is impacted cerumen.     Nose: Mucosal edema and rhinorrhea present. No congestion.     Right Turbinates: Enlarged, swollen and pale.     Left Turbinates: Enlarged, swollen and pale.     Right Sinus: No maxillary sinus tenderness or frontal sinus tenderness.     Left Sinus: No maxillary sinus tenderness or frontal sinus tenderness.     Comments: Profuse clear nasal discharge bilaterally    Mouth/Throat:     Lips: Pink.     Mouth: Mucous membranes are moist. Mucous membranes are not pale.     Palate: No lesions.     Pharynx: Oropharynx is clear. Uvula midline. Posterior oropharyngeal erythema present. No pharyngeal swelling, oropharyngeal exudate or uvula swelling.     Tonsils: No tonsillar exudate or tonsillar abscesses. Swelling: 0 on the right. 0 on the left.  Eyes:     General:        Right eye: No discharge.        Left eye: No discharge.     Conjunctiva/sclera: Conjunctivae normal.     Pupils: Pupils are equal, round, and reactive to light.  Neck:     Musculoskeletal: Normal range of motion and neck supple.     Trachea: No tracheal deviation.  Cardiovascular:     Rate and Rhythm: Normal rate and regular rhythm.  No extrasystoles are present.    Chest Wall: PMI is not displaced.     Pulses: Normal pulses.          Radial pulses are 2+ on the right side and 2+ on the left side.       Posterior tibial pulses are 2+ on the right side and 2+ on the left side.     Heart sounds: Normal heart sounds. No murmur. No friction rub. No gallop.   Pulmonary:     Effort: Pulmonary effort is normal. No respiratory  distress.     Breath sounds: No stridor. Wheezing and rhonchi present. No rales.     Comments: Patient with frequent severe coughing fits triggered by normal inspiration and by trying to talk, inspiratory effort is splinted, and when she is not coughing she is only able to speak in short sentences.  When not talking and at rest she has no tachypnea, retractions or accessory muscle use, no respiratory distress. She has diminished breath sounds bilaterally at the bases, scattered rhonchi and expiratory wheeze Abdominal:     General: Bowel sounds are normal. There is no distension.     Palpations: Abdomen is soft.  Musculoskeletal: Normal range of motion.     Right lower leg: No edema.     Left lower leg: No edema.  Lymphadenopathy:     Cervical: No cervical adenopathy.  Skin:    General: Skin is warm and dry.     Coloration: Skin is not pale.     Findings: No rash.  Neurological:     Mental Status: She is alert.     Motor: No abnormal muscle tone.     Coordination: Coordination normal.  Psychiatric:        Behavior: Behavior normal.           Assessment & Plan:   62 y/o female with 4 days of URI sx causing severe frequent cough -she has history of asthma, no smoking history, she is having difficulty speaking because it triggers coughing fits, but when at rest  she is breathing with a normal respiratory rate without retractions, tachypnea or accessory muscle use.  She does have expiratory wheeze on exam and diminished breath sounds bilaterally at the bases but I believe this is likely secondary to severe bronchospasm difficulty taking a deep full breath in.  She was given a breathing treatment with DuoNeb and additional vial of albuterol.  She was reassessed after the breathing treatment her coughing frequency decreased significantly, her breath sounds improved bilaterally at the bases and she only had wheeze recur when coughing and otherwise lungs were clear to auscultation anteriorly and  posteriorly.  Believe she has a viral URI that has led to an asthma exacerbation/bronchitis with severe bronchospasms, she was encouraged to use her albuterol frequently, start steroids Z-Pak and use cough suppressants.  History of lung disease/asthma will use a Z-Pak to cover for acute bronchitis as well as treat for asthma exacerbation.  Patient encouraged to start antihistamine and Singulair.  Has had Qvar on her chart but has not taken since earlier this year, if she has any recurrence of asthma exacerbation despite using Singulair she may need to start a maintenance inhaler again, encouraged to follow-up for recheck if not better in the next 1 to 2 weeks, and f/up routinely in the next 1 to 2 months    ICD-10-CM   1. Upper respiratory tract infection, unspecified type J06.9 albuterol (PROVENTIL) (2.5 MG/3ML) 0.083% nebulizer solution 2.5 mg    ipratropium-albuterol (DUONEB) 0.5-2.5 (3) MG/3ML nebulizer solution 3 mL    montelukast (SINGULAIR) 10 MG tablet  2. Asthmatic bronchitis with acute exacerbation, unspecified asthma severity, unspecified whether persistent J45.901 albuterol (PROVENTIL) (2.5 MG/3ML) 0.083% nebulizer solution 2.5 mg    ipratropium-albuterol (DUONEB) 0.5-2.5 (3) MG/3ML nebulizer solution 3 mL    azithromycin (ZITHROMAX) 250 MG tablet    chlorpheniramine-HYDROcodone (TUSSIONEX PENNKINETIC ER) 10-8 MG/5ML SUER    predniSONE (DELTASONE) 20 MG tablet    Albuterol Sulfate 108 (90 Base) MCG/ACT AEPB    benzonatate (TESSALON) 100 MG capsule    montelukast (SINGULAIR) 10 MG tablet       Delsa Grana, PA-C 03/07/18 10:33 AM

## 2018-03-16 ENCOUNTER — Ambulatory Visit (INDEPENDENT_AMBULATORY_CARE_PROVIDER_SITE_OTHER): Admitting: Family Medicine

## 2018-03-16 ENCOUNTER — Encounter: Payer: Self-pay | Admitting: Family Medicine

## 2018-03-16 VITALS — BP 108/62 | HR 73 | Temp 98.8°F | Resp 15 | Ht 63.0 in | Wt 146.1 lb

## 2018-03-16 DIAGNOSIS — J069 Acute upper respiratory infection, unspecified: Secondary | ICD-10-CM

## 2018-03-16 DIAGNOSIS — J45901 Unspecified asthma with (acute) exacerbation: Secondary | ICD-10-CM

## 2018-03-16 MED ORDER — BECLOMETHASONE DIPROPIONATE 80 MCG/ACT IN AERS: INHALATION_SPRAY | RESPIRATORY_TRACT | 11 refills | Status: DC

## 2018-03-16 MED ORDER — IPRATROPIUM-ALBUTEROL 0.5-2.5 (3) MG/3ML IN SOLN
3.0000 mL | Freq: Once | RESPIRATORY_TRACT | Status: AC
  Administered 2018-03-16: 3 mL via RESPIRATORY_TRACT

## 2018-03-16 MED ORDER — PREDNISONE 20 MG PO TABS: ORAL_TABLET | ORAL | 0 refills | Status: DC

## 2018-03-16 NOTE — Patient Instructions (Addendum)
Will tx your wheezy cough and asthma/bronchitis with a longer steroid taper, start using Qvar maintenance inhaler - two times a day and rinse your mouth out and spit after each use to avoid getting oral thrush.   Use albuterol inhaler every 2-4 hours as needed and use other over the counter cold and cough medicines - high recommend mucinex and delsym/robitussin if you can take/tolerate.  Your nose and throat still look like a viral upper respiratory infection.  If you develop severe sinus pain with fever - you may need another round of antibiotics, but for now treatment is supportive and viral infections usually resolve on their own.

## 2018-03-16 NOTE — Progress Notes (Signed)
Patient ID: Chelsea Griffith, female    DOB: 03/09/1955, 63 y.o.   MRN: [id number]  PCP: Susy Frizzle, MD  Chief Complaint  Patient presents with  . Cough    Patient in with c/o postnasal drip, cough ongoing since being seen on 03/07/18    Subjective:   Chelsea Griffith is a 63 y.o. female, presents to clinic with CC of follow-up for continued and worsening of her URI symptoms wheeze and cough.  She did start steroids Z-Pak and inhalers and prescribed cough medicine on New Year's Eve she did begin to feel better for the next 5 days while on the medications, briefly after stopping the steroids and medications she began to gradually worsen.  She had gotten to the point where she stopped using the inhaler but over the last couple days has had returning postnasal drip severe coughing fits wheeze and shortness of breath she has been using her inhaler again but is not helping very much.  She denies fever chills sweats chest pain fatigue.  She is only using the rescue inhaler and has not started using Qvar her maintenance inhaler.   Patient Active Problem List   Diagnosis Date Noted  . Osteoporosis 04/08/2014  . Elevated lipids 10/19/2013  . GERD (gastroesophageal reflux disease) 10/19/2013  . Chest pain 10/19/2013     Prior to Admission medications   Medication Sig Start Date End Date Taking? Authorizing Provider  Albuterol Sulfate 108 (90 Base) MCG/ACT AEPB Inhale 2 puffs into the lungs every 4 (four) hours as needed (wheeze, SOB, coughing fits). 03/07/18  Yes Delsa Grana, PA-C  alendronate (FOSAMAX) 70 MG tablet Take 1 tablet (70 mg total) by mouth every 7 (seven) days. Take with a full glass of water on an empty stomach. 06/12/15  Yes Susy Frizzle, MD  benzonatate (TESSALON) 100 MG capsule Take 1 capsule (100 mg total) by mouth 3 (three) times daily as needed for cough. 03/07/18  Yes Delsa Grana, PA-C  calcium-vitamin D (OSCAL WITH D) 500-200 MG-UNIT per tablet Take 1 tablet  by mouth.   Yes [provider]  celecoxib (CELEBREX) 200 MG capsule TAKE ONE CAPSULE BY MOUTH EVERY DAY 05/20/16  Yes Susy Frizzle, MD  chlorpheniramine-HYDROcodone (TUSSIONEX PENNKINETIC ER) 10-8 MG/5ML SUER Take 5 mLs by mouth at bedtime as needed for cough. 03/07/18  Yes Delsa Grana, PA-C  clonazePAM (KLONOPIN) 0.5 MG tablet Take 0.5 tablets (0.25 mg total) by mouth 2 (two) times daily as needed for anxiety. 04/04/17  Yes Susy Frizzle, MD  clonazePAM (KLONOPIN) 1 MG tablet Take 1 mg by mouth at bedtime.  04/10/13  Yes [provider]  FLUoxetine (PROZAC) 40 MG capsule Take 40 mg by mouth daily.  04/04/13  Yes [provider]  montelukast (SINGULAIR) 10 MG tablet Take 1 tablet (10 mg total) by mouth at bedtime. 03/07/18  Yes Delsa Grana, PA-C  pantoprazole (PROTONIX) 40 MG tablet Take 1 tablet (40 mg total) by mouth daily. 04/08/17  Yes Susy Frizzle, MD  pravastatin (PRAVACHOL) 20 MG tablet TAKE 1 TABLET BY MOUTH EVERY DAY 10/10/17  Yes Pickard, Cammie Mcgee, MD  QVAR 80 MCG/ACT inhaler INHALE 2 PUFFS INTO THE LUNGS 2 (TWO) TIMES DAILY. 05/02/15  Yes Susy Frizzle, MD  traZODone (DESYREL) 100 MG tablet 50 mg at bedtime.  03/02/13  Yes [provider]     Allergies  Allergen Reactions  . Codeine Itching     Family History  Problem  Relation Age of Onset  . Hyperlipidemia Mother   . ADD / ADHD Son   . Heart disease Maternal Grandmother   . Colon cancer Neg Hx   . Esophageal cancer Neg Hx   . Rectal cancer Neg Hx   . Stomach cancer Neg Hx      Social History   Socioeconomic History  . Marital status: Widowed    Spouse name: Not on file  . Number of children: Not on file  . Years of education: Not on file  . Highest education level: Not on file  Occupational History  . Not on file  Social Needs  . Financial resource strain: Not on file  . Food insecurity:    Worry: Not on file    Inability: Not on file  . Transportation needs:     Medical: Not on file    Non-medical: Not on file  Tobacco Use  . Smoking status: Never Smoker  . Smokeless tobacco: Never Used  Substance and Sexual Activity  . Alcohol use: Yes    Alcohol/week: 1.0 standard drinks    Types: 1 Shots of liquor per week    Comment: occasional weekly drink  . Drug use: No  . Sexual activity: Yes    Comment: single  Lifestyle  . Physical activity:    Days per week: Not on file    Minutes per session: Not on file  . Stress: Not on file  Relationships  . Social connections:    Talks on phone: Not on file    Gets together: Not on file    Attends religious service: Not on file    Active member of club or organization: Not on file    Attends meetings of clubs or organizations: Not on file    Relationship status: Not on file  . Intimate partner violence:    Fear of current or ex partner: Not on file    Emotionally abused: Not on file    Physically abused: Not on file    Forced sexual activity: Not on file  Other Topics Concern  . Not on file  Social History Narrative  . Not on file     Review of Systems  Constitutional: Negative.   HENT: Negative.   Eyes: Negative.   Respiratory: Negative.   Cardiovascular: Negative.   Gastrointestinal: Negative.   Endocrine: Negative.   Genitourinary: Negative.   Musculoskeletal: Negative.   Skin: Negative.   Allergic/Immunologic: Negative.   Neurological: Negative.   Hematological: Negative.   Psychiatric/Behavioral: Negative.   All other systems reviewed and are negative.      Objective:    Vitals:   03/16/18 0932  BP: 108/62  Pulse: 73  Resp: 15  Temp: 98.8 F (37.1 C)  TempSrc: Oral  SpO2: 95%  Weight: 146 lb 2 oz (66.3 kg)  Height: 5\' 3"  (1.6 m)      Physical Exam Vitals signs and nursing note reviewed.  Constitutional:      General: She is not in acute distress.    Appearance: She is well-developed and normal weight. She is not ill-appearing, toxic-appearing or diaphoretic.    HENT:     Head: Normocephalic and atraumatic.     Right Ear: Hearing, tympanic membrane, ear canal and external ear normal.     Left Ear: Hearing, tympanic membrane, ear canal and external ear normal.     Nose: Mucosal edema, congestion and rhinorrhea present.     Right Sinus: No maxillary sinus tenderness or  frontal sinus tenderness.     Left Sinus: No maxillary sinus tenderness or frontal sinus tenderness.     Mouth/Throat:     Mouth: Mucous membranes are moist. Mucous membranes are not pale.     Pharynx: Uvula midline. Posterior oropharyngeal erythema present. No oropharyngeal exudate or uvula swelling.     Tonsils: No tonsillar abscesses.  Eyes:     General:        Right eye: No discharge.        Left eye: No discharge.     Conjunctiva/sclera: Conjunctivae normal.     Pupils: Pupils are equal, round, and reactive to light.  Neck:     Musculoskeletal: Normal range of motion and neck supple.     Trachea: No tracheal deviation.  Cardiovascular:     Rate and Rhythm: Normal rate and regular rhythm.     Heart sounds: Normal heart sounds. No murmur. No friction rub. No gallop.   Pulmonary:     Effort: No respiratory distress.     Breath sounds: No stridor. Wheezing present. No rhonchi or rales.  Abdominal:     General: Bowel sounds are normal. There is no distension.     Palpations: Abdomen is soft.  Musculoskeletal: Normal range of motion.  Skin:    General: Skin is warm and dry.     Coloration: Skin is not pale.     Findings: No rash.  Neurological:     Mental Status: She is alert.     Motor: No abnormal muscle tone.     Coordination: Coordination normal.  Psychiatric:        Behavior: Behavior normal.           Assessment & Plan:   Follow-up with continued cough and wheeze and URI symptoms that they do seem to be gradually improving.  With her history of asthma patient may need a longer steroid taper, was encouraged to start using Qvar maintenance inhaler again,  instructions reviewed for swishing and spitting, albuterol inhaler or nebs as needed for wheeze cough shortness of breath chest tightness, other supportive and symptomatic treatment including strongly urged to take Mucinex and Delsym or Robitussin if she tolerates.  Otherwise suspect URI, viral illness, no indication for abx at this time, supportive/sx.  F/up with PCP for asthma f/up/monitoring.      ICD-10-CM   1. Upper respiratory tract infection, unspecified type J06.9 ipratropium-albuterol (DUONEB) 0.5-2.5 (3) MG/3ML nebulizer solution 3 mL  2. Asthmatic bronchitis with acute exacerbation, unspecified asthma severity, unspecified whether persistent J45.901 predniSONE (DELTASONE) 20 MG tablet    ipratropium-albuterol (DUONEB) 0.5-2.5 (3) MG/3ML nebulizer solution 3 mL       Delsa Grana, PA-C 03/16/18 9:53 AM

## 2018-03-22 ENCOUNTER — Encounter: Payer: Self-pay | Admitting: Family Medicine

## 2018-04-07 ENCOUNTER — Ambulatory Visit (INDEPENDENT_AMBULATORY_CARE_PROVIDER_SITE_OTHER): Admitting: Family Medicine

## 2018-04-07 ENCOUNTER — Encounter: Payer: Self-pay | Admitting: Family Medicine

## 2018-04-07 VITALS — BP 110/74 | HR 66 | Temp 98.0°F | Resp 16 | Ht 63.0 in | Wt 148.0 lb

## 2018-04-07 DIAGNOSIS — Z0000 Encounter for general adult medical examination without abnormal findings: Secondary | ICD-10-CM

## 2018-04-07 DIAGNOSIS — M810 Age-related osteoporosis without current pathological fracture: Secondary | ICD-10-CM | POA: Diagnosis not present

## 2018-04-07 DIAGNOSIS — Z1239 Encounter for other screening for malignant neoplasm of breast: Secondary | ICD-10-CM

## 2018-04-07 DIAGNOSIS — E785 Hyperlipidemia, unspecified: Secondary | ICD-10-CM | POA: Diagnosis not present

## 2018-04-07 DIAGNOSIS — Z23 Encounter for immunization: Secondary | ICD-10-CM

## 2018-04-07 NOTE — Addendum Note (Signed)
Addended by: Shary Decamp B on: 04/07/2018 02:46 PM   Modules accepted: Orders

## 2018-04-07 NOTE — Progress Notes (Signed)
Subjective:    Patient ID: Chelsea Griffith, female    DOB: 1955-11-08, 63 y.o.   MRN: [id number]  HPI Patient is here today for complete physical exam.  Her colonoscopy was in 2016 and is up-to-date.  She is due for Shingrix.  We do not stock that vaccine and I recommended that she check with her local pharmacy regarding this.  She is due for a flu shot which she agrees to receive today.  She gets her mammogram at her gynecologist office.  She is overdue for a bone density test.  Last year her husband died suddenly.  Patient is emotionally doing better this year.  She states that overall she is doing well with no medical concerns.  She did recently have bronchitis but this is gradually improving. Immunization History  Administered Date(s) Administered  . Influenza,inj,Quad PF,6+ Mos 04/04/2017  . Tdap 04/13/2013   Past Medical History:  Diagnosis Date  . Anxiety   . Asthma   . Depression   . Hyperlipidemia   . Osteoporosis    right hip   Past Surgical History:  Procedure Laterality Date  . ABDOMINAL HYSTERECTOMY    . CESAREAN SECTION      2 times  . DILATION AND CURETTAGE OF UTERUS     several  . ELBOW SURGERY      2times/1 time on left elbow  . KNEE SURGERY     right knee  . PARTIAL HYSTERECTOMY     2 times  . ROTATOR CUFF REPAIR     right shoulder   Current Outpatient Medications on File Prior to Visit  Medication Sig Dispense Refill  . Albuterol Sulfate 108 (90 Base) MCG/ACT AEPB Inhale 2 puffs into the lungs every 4 (four) hours as needed (wheeze, SOB, coughing fits). 1 each 1  . beclomethasone (QVAR) 80 MCG/ACT inhaler INHALE 2 PUFFS INTO THE LUNGS 2 (TWO) TIMES DAILY. 8.7 g 11  . calcium-vitamin D (OSCAL WITH D) 500-200 MG-UNIT per tablet Take 1 tablet by mouth.    . celecoxib (CELEBREX) 200 MG capsule TAKE ONE CAPSULE BY MOUTH EVERY DAY 90 capsule 0  . clonazePAM (KLONOPIN) 1 MG tablet Take 1 mg by mouth at bedtime.     Marland Kitchen FLUoxetine (PROZAC) 40 MG capsule Take 40  mg by mouth daily.     . montelukast (SINGULAIR) 10 MG tablet Take 1 tablet (10 mg total) by mouth at bedtime. 30 tablet 3  . pantoprazole (PROTONIX) 40 MG tablet Take 1 tablet (40 mg total) by mouth daily. 90 tablet 3  . pravastatin (PRAVACHOL) 20 MG tablet TAKE 1 TABLET BY MOUTH EVERY DAY 90 tablet 1  . traZODone (DESYREL) 100 MG tablet 50 mg at bedtime.      No current facility-administered medications on file prior to visit.    Allergies  Allergen Reactions  . Codeine Itching   Social History   Socioeconomic History  . Marital status: Widowed    Spouse name: Not on file  . Number of children: Not on file  . Years of education: Not on file  . Highest education level: Not on file  Occupational History  . Not on file  Social Needs  . Financial resource strain: Not on file  . Food insecurity:    Worry: Not on file    Inability: Not on file  . Transportation needs:    Medical: Not on file    Non-medical: Not on file  Tobacco Use  . Smoking status: Never  Smoker  . Smokeless tobacco: Never Used  Substance and Sexual Activity  . Alcohol use: Yes    Alcohol/week: 1.0 standard drinks    Types: 1 Shots of liquor per week    Comment: occasional weekly drink  . Drug use: No  . Sexual activity: Yes    Comment: single  Lifestyle  . Physical activity:    Days per week: Not on file    Minutes per session: Not on file  . Stress: Not on file  Relationships  . Social connections:    Talks on phone: Not on file    Gets together: Not on file    Attends religious service: Not on file    Active member of club or organization: Not on file    Attends meetings of clubs or organizations: Not on file    Relationship status: Not on file  . Intimate partner violence:    Fear of current or ex partner: Not on file    Emotionally abused: Not on file    Physically abused: Not on file    Forced sexual activity: Not on file  Other Topics Concern  . Not on file  Social History Narrative    . Not on file   Family History  Problem Relation Age of Onset  . Hyperlipidemia Mother   . ADD / ADHD Son   . Heart disease Maternal Grandmother   . Colon cancer Neg Hx   . Esophageal cancer Neg Hx   . Rectal cancer Neg Hx   . Stomach cancer Neg Hx       Review of Systems  All other systems reviewed and are negative.      Objective:   Physical Exam  Constitutional: She is oriented to person, place, and time. She appears well-developed and well-nourished. No distress.  HENT:  Head: Normocephalic and atraumatic.  Right Ear: External ear normal.  Left Ear: External ear normal.  Nose: Nose normal.  Mouth/Throat: Oropharynx is clear and moist. No oropharyngeal exudate.  Eyes: Pupils are equal, round, and reactive to light. Conjunctivae and EOM are normal. Right eye exhibits no discharge. Left eye exhibits no discharge. No scleral icterus.  Neck: Normal range of motion. Neck supple. No JVD present. No tracheal deviation present. No thyromegaly present.  Cardiovascular: Normal rate, regular rhythm, normal heart sounds and intact distal pulses. Exam reveals no gallop and no friction rub.  No murmur heard. Pulmonary/Chest: Effort normal and breath sounds normal. No stridor. No respiratory distress. She has no wheezes. She has no rales. She exhibits no tenderness.  Abdominal: Soft. Bowel sounds are normal. She exhibits no distension and no mass. There is no abdominal tenderness. There is no rebound and no guarding.  Musculoskeletal: Normal range of motion.        General: No tenderness, deformity or edema.  Lymphadenopathy:    She has no cervical adenopathy.  Neurological: She is alert and oriented to person, place, and time. She has normal reflexes. No cranial nerve deficit. She exhibits normal muscle tone. Coordination normal.  Skin: Skin is warm. No rash noted. She is not diaphoretic. No erythema. No pallor.  Psychiatric: She has a normal mood and affect. Her behavior is normal.  Judgment and thought content normal.  Vitals reviewed.         Assessment & Plan:  Routine general medical examination at a health care facility  Elevated lipids - Plan: CBC with Differential/Platelet, COMPLETE METABOLIC PANEL WITH GFR, Lipid panel  Osteoporosis without current  pathological fracture, unspecified osteoporosis type - Plan: DG Bone Density  Breast cancer screening - Plan: CANCELED: MM Digital Screening  Physical exam today is completely normal aside from some coughing.  She received her flu shot.  I recommended the shingles vaccine at a local pharmacy.  Her colonoscopy is up-to-date.  I will defer her mammogram to her gynecologist who also performs her pelvic exam.  I will schedule the patient for a bone density test.  I will also check a CBC, CMP, and fasting lipid panel at her earliest convenience.  Blood pressure today is outstanding.  Regular anticipatory guidance is provided.

## 2018-04-14 ENCOUNTER — Other Ambulatory Visit

## 2018-04-14 LAB — CBC WITH DIFFERENTIAL/PLATELET
ABSOLUTE MONOCYTES: 322 {cells}/uL (ref 200–950)
Basophils Absolute: 10 cells/uL (ref 0–200)
Basophils Relative: 0.2 %
Eosinophils Absolute: 73 cells/uL (ref 15–500)
Eosinophils Relative: 1.4 %
HCT: 40.1 % (ref 35.0–45.0)
Hemoglobin: 13.5 g/dL (ref 11.7–15.5)
Lymphs Abs: 1456 cells/uL (ref 850–3900)
MCH: 29.6 pg (ref 27.0–33.0)
MCHC: 33.7 g/dL (ref 32.0–36.0)
MCV: 87.9 fL (ref 80.0–100.0)
MPV: 9.8 fL (ref 7.5–12.5)
Monocytes Relative: 6.2 %
Neutro Abs: 3338 cells/uL (ref 1500–7800)
Neutrophils Relative %: 64.2 %
Platelets: 195 10*3/uL (ref 140–400)
RBC: 4.56 10*6/uL (ref 3.80–5.10)
RDW: 13.6 % (ref 11.0–15.0)
TOTAL LYMPHOCYTE: 28 %
WBC: 5.2 10*3/uL (ref 3.8–10.8)

## 2018-04-14 LAB — COMPLETE METABOLIC PANEL WITH GFR
AG RATIO: 2 (calc) (ref 1.0–2.5)
ALT: 13 U/L (ref 6–29)
AST: 16 U/L (ref 10–35)
Albumin: 4.3 g/dL (ref 3.6–5.1)
Alkaline phosphatase (APISO): 67 U/L (ref 37–153)
BILIRUBIN TOTAL: 0.4 mg/dL (ref 0.2–1.2)
BUN: 14 mg/dL (ref 7–25)
CALCIUM: 9.6 mg/dL (ref 8.6–10.4)
CO2: 28 mmol/L (ref 20–32)
Chloride: 106 mmol/L (ref 98–110)
Creat: 0.86 mg/dL (ref 0.50–0.99)
GFR, Est African American: 83 mL/min/{1.73_m2} (ref >=60)
GFR, Est Non African American: 72 mL/min/{1.73_m2} (ref >=60)
Globulin: 2.2 g/dL (calc) (ref 1.9–3.7)
Glucose, Bld: 100 mg/dL — ABNORMAL HIGH (ref 65–99)
Potassium: 4.5 mmol/L (ref 3.5–5.3)
Sodium: 142 mmol/L (ref 135–146)
Total Protein: 6.5 g/dL (ref 6.1–8.1)

## 2018-04-14 LAB — LIPID PANEL
Cholesterol: 186 mg/dL (ref <200)
HDL: 57 mg/dL (ref >=50)
LDL Cholesterol (Calc): 111 mg/dL (calc) — ABNORMAL HIGH
NON-HDL CHOLESTEROL (CALC): 129 mg/dL (ref <130)
Total CHOL/HDL Ratio: 3.3 (calc) (ref <5.0)
Triglycerides: 89 mg/dL (ref <150)

## 2018-04-16 ENCOUNTER — Other Ambulatory Visit: Payer: Self-pay | Admitting: Family Medicine

## 2018-04-17 ENCOUNTER — Encounter: Payer: Self-pay | Admitting: Family Medicine

## 2018-04-27 ENCOUNTER — Telehealth: Payer: Self-pay | Admitting: Internal Medicine

## 2018-04-27 NOTE — Telephone Encounter (Signed)
error 

## 2018-05-29 ENCOUNTER — Other Ambulatory Visit: Payer: Self-pay | Admitting: Family Medicine

## 2018-05-29 DIAGNOSIS — J069 Acute upper respiratory infection, unspecified: Secondary | ICD-10-CM

## 2018-05-29 DIAGNOSIS — J45901 Unspecified asthma with (acute) exacerbation: Secondary | ICD-10-CM

## 2018-06-21 ENCOUNTER — Other Ambulatory Visit: Payer: BLUE CROSS/BLUE SHIELD

## 2018-06-26 ENCOUNTER — Other Ambulatory Visit: Payer: Self-pay

## 2018-06-26 ENCOUNTER — Ambulatory Visit (INDEPENDENT_AMBULATORY_CARE_PROVIDER_SITE_OTHER): Admitting: Family Medicine

## 2018-06-26 ENCOUNTER — Encounter: Payer: Self-pay | Admitting: Family Medicine

## 2018-06-26 VITALS — BP 110/74 | HR 69 | Temp 98.7°F | Resp 16 | Ht 63.0 in | Wt 146.0 lb

## 2018-06-26 DIAGNOSIS — J329 Chronic sinusitis, unspecified: Secondary | ICD-10-CM | POA: Diagnosis not present

## 2018-06-26 MED ORDER — AMOXICILLIN 875 MG PO TABS: 875.0000 mg | ORAL_TABLET | Freq: Two times a day (BID) | ORAL | 0 refills | Status: DC

## 2018-06-26 MED ORDER — LEVOCETIRIZINE DIHYDROCHLORIDE 5 MG PO TABS: 5.0000 mg | ORAL_TABLET | Freq: Every evening | ORAL | 0 refills | Status: DC

## 2018-06-26 MED ORDER — FLUTICASONE PROPIONATE 50 MCG/ACT NA SUSP: 2.0000 | Freq: Every day | NASAL | 6 refills | Status: DC

## 2018-06-26 NOTE — Progress Notes (Signed)
Subjective:    Patient ID: Chelsea Griffith, female    DOB: 1955-11-07, 63 y.o.   MRN: [id number]  HPI Patient takes Singulair for asthma as well as allergies.  However over the last 3 to 4 days, she has developed pressure in her frontal and maxillary sinuses.  She is developed rhinorrhea and head congestion.  She is also developed pressure behind both ears as well as some mild dizziness.  She denies any fevers or chills.  She has a chronic cough but this is not recently worsened.  She denies any shortness of breath or dyspnea on exertion.  She denies any sore throat.  On examination she has bilateral cerumen impaction.  This was removed with irrigation/lavage.  Both tympanic membranes were pearly gray with no middle ear effusion.  However the patient saw no benefit after the wax plug was removed other than her hearing improved.  She continues to report pressure in both ears, pressure in both the frontal and maxillary sinus areas and some mild dizziness Past Medical History:  Diagnosis Date  . Anxiety   . Asthma   . Depression   . Hyperlipidemia   . Osteoporosis    right hip   Past Surgical History:  Procedure Laterality Date  . ABDOMINAL HYSTERECTOMY    . CESAREAN SECTION      2 times  . DILATION AND CURETTAGE OF UTERUS     several  . ELBOW SURGERY      2times/1 time on left elbow  . KNEE SURGERY     right knee  . PARTIAL HYSTERECTOMY     2 times  . ROTATOR CUFF REPAIR     right shoulder   Current Outpatient Medications on File Prior to Visit  Medication Sig Dispense Refill  . Albuterol Sulfate 108 (90 Base) MCG/ACT AEPB Inhale 2 puffs into the lungs every 4 (four) hours as needed (wheeze, SOB, coughing fits). 1 each 1  . beclomethasone (QVAR) 80 MCG/ACT inhaler INHALE 2 PUFFS INTO THE LUNGS 2 (TWO) TIMES DAILY. 8.7 g 11  . calcium-vitamin D (OSCAL WITH D) 500-200 MG-UNIT per tablet Take 1 tablet by mouth.    . celecoxib (CELEBREX) 200 MG capsule TAKE ONE CAPSULE BY MOUTH EVERY  DAY 90 capsule 0  . clonazePAM (KLONOPIN) 1 MG tablet Take 1 mg by mouth at bedtime.     Marland Kitchen FLUoxetine (PROZAC) 40 MG capsule Take 40 mg by mouth daily.     . montelukast (SINGULAIR) 10 MG tablet TAKE 1 TABLET BY MOUTH EVERYDAY AT BEDTIME 90 tablet 1  . pantoprazole (PROTONIX) 40 MG tablet TAKE 1 TABLET BY MOUTH EVERY DAY 90 tablet 3  . pravastatin (PRAVACHOL) 20 MG tablet TAKE 1 TABLET BY MOUTH EVERY DAY 90 tablet 1  . traZODone (DESYREL) 100 MG tablet 50 mg at bedtime.      No current facility-administered medications on file prior to visit.    Allergies  Allergen Reactions  . Codeine Itching   Social History   Socioeconomic History  . Marital status: Widowed    Spouse name: Not on file  . Number of children: Not on file  . Years of education: Not on file  . Highest education level: Not on file  Occupational History  . Not on file  Social Needs  . Financial resource strain: Not on file  . Food insecurity:    Worry: Not on file    Inability: Not on file  . Transportation needs:    Medical:  Not on file    Non-medical: Not on file  Tobacco Use  . Smoking status: Never Smoker  . Smokeless tobacco: Never Used  Substance and Sexual Activity  . Alcohol use: Yes    Alcohol/week: 1.0 standard drinks    Types: 1 Shots of liquor per week    Comment: occasional weekly drink  . Drug use: No  . Sexual activity: Yes    Comment: single  Lifestyle  . Physical activity:    Days per week: Not on file    Minutes per session: Not on file  . Stress: Not on file  Relationships  . Social connections:    Talks on phone: Not on file    Gets together: Not on file    Attends religious service: Not on file    Active member of club or organization: Not on file    Attends meetings of clubs or organizations: Not on file    Relationship status: Not on file  . Intimate partner violence:    Fear of current or ex partner: Not on file    Emotionally abused: Not on file    Physically abused:  Not on file    Forced sexual activity: Not on file  Other Topics Concern  . Not on file  Social History Narrative  . Not on file      Review of Systems  All other systems reviewed and are negative.      Objective:   Physical Exam  Constitutional: She appears well-developed and well-nourished. No distress.  HENT:  Right Ear: External ear normal.  Left Ear: External ear normal.  Nose: Mucosal edema and rhinorrhea present. Right sinus exhibits frontal sinus tenderness. Left sinus exhibits frontal sinus tenderness.  Mouth/Throat: Oropharynx is clear and moist. No oropharyngeal exudate.  Eyes: Conjunctivae are normal.  Neck: Neck supple.  Cardiovascular: Normal rate, regular rhythm and normal heart sounds.  No murmur heard. Pulmonary/Chest: Effort normal. She has no decreased breath sounds. She has no wheezes. She has no rhonchi. She has no rales. She exhibits no tenderness.  Lymphadenopathy:    She has no cervical adenopathy.  Skin: She is not diaphoretic.  Vitals reviewed.         Assessment & Plan:  Rhinosinusitis  Despite removing the cerumen impaction, the symptoms did not improve.  Therefore I believe most of her symptoms are due to rhinosinusitis coupled with eustachian tube dysfunction.  Begin Xyzal 5 mg a day coupled with Flonase 2 sprays each nostril daily.  Should she develop worsening pain in her sinuses or fever, she can then take amoxicillin 875 mg p.o. twice daily for 10 days for secondary sinusitis however I cautioned her against doing this.  I see no indication for antibiotics at the present time but would rather focus on treating the allergic sinusitis that she has

## 2018-07-19 ENCOUNTER — Other Ambulatory Visit: Payer: Self-pay | Admitting: Family Medicine

## 2018-08-29 ENCOUNTER — Encounter (HOSPITAL_COMMUNITY): Payer: Self-pay

## 2018-08-29 ENCOUNTER — Encounter (HOSPITAL_COMMUNITY): Payer: BLUE CROSS/BLUE SHIELD

## 2018-08-29 NOTE — Patient Instructions (Addendum)
Chelsea Griffith  08/29/2018   Your procedure is scheduled on: Wednesday 09/06/2018   Report to [facility] Main  Entrance              Report to admitting at  0835  AM               YOU NEED TO HAVE A COVID 19 TEST ON_______ @_______ , THIS TEST MUST BE DONE BEFORE SURGERY, COME TO [place].             ONCE YOUR COVID TEST IS COMPLETED, PLEASE BEGIN THE QUARANTINE INSTRUCTIONS AS OUTLINED IN YOUR HANDOUT.   Call this number if you have problems the morning of surgery [phone]    Remember: Do not eat food or drink liquids :After Midnight.              NO SOLID FOOD AFTER MIDNIGHT THE NIGHT PRIOR TO SURGERY. NOTHING BY MOUTH EXCEPT CLEAR LIQUIDS UNTIL 3 HOURS PRIOR TO [place] SURGERY.             PLEASE FINISH ENSURE DRINK PER SURGEON ORDER 3 HOURS PRIOR TO SCHEDULED SURGERY TIME WHICH NEEDS TO BE COMPLETED AT 0805 am.   CLEAR LIQUID DIET   Foods Allowed                                                                     Foods Excluded  Coffee and tea, regular and decaf                             liquids that you cannot  Plain Jell-O in any flavor                                             see through such as: Fruit ices (not with fruit pulp)                                     milk, soups, orange juice  Iced Popsicles                                    All solid food Carbonated beverages, regular and diet                                    Cranberry, grape and apple juices Sports drinks like Gatorade Lightly seasoned clear broth or consume(fat free) Sugar, honey syrup  Sample Menu Breakfast                                Lunch  Supper Cranberry juice                    Beef broth                            Chicken broth Jell-O                                     Grape juice                           Apple juice Coffee or tea                        Jell-O                                       Popsicle                                                Coffee or tea                        Coffee or tea  _____________________________________________________________________               Take these medicines the morning of surgery with A SIP OF WATER: Fluoxetine (Prozac), Pravastatin (Pravachol), use Qvar inhaler , use Symbicort inhaler, use Albuterol inhaler if needed and bring inhalers with you to the hospital the morning of surgery   BRUSH YOUR TEETH MORNING OF SURGERY AND RINSE YOUR MOUTH OUT, NO CHEWING GUM [place].                                You may not have any metal on your body including hair pins and              piercings     Do not wear jewelry, cologne, lotions, powders or deodorant              Do not wear nail polish.  Do not shave  48 hours prior to surgery.               Do not bring valuables to the hospital. [place].  Contacts, dentures or bridgework may not be worn into surgery.                Please read over the following fact sheets you were given: _____________________________________________________________________             [facility] - Preparing for Surgery Before surgery, you can play an important role.  Because skin is not sterile, your skin needs to be as free of germs as possible.  You can reduce the number of germs on your skin by washing with CHG (chlorahexidine gluconate) soap before surgery.  CHG is an antiseptic cleaner which kills germs and bonds with the skin to continue killing germs even after washing. Please  DO NOT use if you have an allergy to CHG or antibacterial soaps.  If your skin becomes reddened/irritated stop using the CHG and inform your nurse when you arrive at Short Stay. Do not shave (including legs and underarms) for at least 48 hours prior to the first CHG shower.  You may shave your face/neck. Please follow these instructions  carefully:  1.  Shower with CHG Soap the night before surgery and the  morning of Surgery.  2.  If you choose to wash your hair, wash your hair first as usual with your  normal  shampoo.  3.  After you shampoo, rinse your hair and body thoroughly to remove the  shampoo.                           4.  Use CHG as you would any other liquid soap.  You can apply chg directly  to the skin and wash                       Gently with a scrungie or clean washcloth.  5.  Apply the CHG Soap to your body ONLY FROM THE NECK DOWN.   Do not use on face/ open                           Wound or open sores. Avoid contact with eyes, ears mouth and genitals (private parts).                       Wash face,  Genitals (private parts) with your normal soap.             6.  Wash thoroughly, paying special attention to the area where your surgery  will be performed.  7.  Thoroughly rinse your body with warm water from the neck down.  8.  DO NOT shower/wash with your normal soap after using and rinsing off  the CHG Soap.                9.  Pat yourself dry with a clean towel.            10.  Wear clean pajamas.            11.  Place clean sheets on your bed the night of your first shower and do not  sleep with pets. Day of Surgery : Do not apply any lotions/deodorants the morning of surgery.  Please wear clean clothes to the hospital/surgery center.  FAILURE TO FOLLOW THESE INSTRUCTIONS MAY RESULT IN THE CANCELLATION OF YOUR SURGERY PATIENT SIGNATURE_________________________________  NURSE SIGNATURE__________________________________  ________________________________________________________________________

## 2018-08-30 ENCOUNTER — Ambulatory Visit
Admission: RE | Admit: 2018-08-30 | Discharge: 2018-08-30 | Disposition: A | Discharge status: Home / Self Care | Payer: Non-veteran care | Source: Ambulatory Visit | Attending: Family Medicine | Admitting: Family Medicine

## 2018-08-30 ENCOUNTER — Other Ambulatory Visit: Payer: Self-pay

## 2018-08-30 ENCOUNTER — Encounter (HOSPITAL_COMMUNITY)
Admission: RE | Admit: 2018-08-30 | Discharge: 2018-08-30 | Disposition: A | Discharge status: Home / Self Care | Source: Ambulatory Visit | Attending: Orthopaedic Surgery | Admitting: Orthopaedic Surgery

## 2018-08-30 DIAGNOSIS — M810 Age-related osteoporosis without current pathological fracture: Secondary | ICD-10-CM

## 2018-09-01 ENCOUNTER — Other Ambulatory Visit: Payer: Self-pay

## 2018-09-01 ENCOUNTER — Encounter (HOSPITAL_COMMUNITY)
Admission: RE | Admit: 2018-09-01 | Discharge: 2018-09-01 | Disposition: A | Discharge status: Home / Self Care | Source: Ambulatory Visit | Attending: Orthopaedic Surgery | Admitting: Orthopaedic Surgery

## 2018-09-01 ENCOUNTER — Encounter (HOSPITAL_COMMUNITY): Payer: Self-pay

## 2018-09-01 DIAGNOSIS — Z01812 Encounter for preprocedural laboratory examination: Secondary | ICD-10-CM | POA: Diagnosis present

## 2018-09-01 HISTORY — DX: Unspecified rotator cuff tear or rupture of right shoulder, not specified as traumatic: M75.101

## 2018-09-01 HISTORY — DX: Unspecified osteoarthritis, unspecified site: M19.90

## 2018-09-01 HISTORY — DX: Personal history of diseases of the blood and blood-forming organs and certain disorders involving the immune mechanism: Z86.2

## 2018-09-01 HISTORY — DX: Gastro-esophageal reflux disease without esophagitis: K21.9

## 2018-09-01 LAB — CBC
HCT: 39.4 % (ref 36.0–46.0)
Hemoglobin: 13.5 g/dL (ref 12.0–15.0)
MCH: 29.5 pg (ref 26.0–34.0)
MCHC: 34.3 g/dL (ref 30.0–36.0)
MCV: 86.2 fL (ref 80.0–100.0)
Platelets: 154 10*3/uL (ref 150–400)
RBC: 4.57 MIL/uL (ref 3.87–5.11)
RDW: 12.4 % (ref 11.5–15.5)
WBC: 5.9 10*3/uL (ref 4.0–10.5)
nRBC: 0 % (ref 0.0–0.2)

## 2018-09-01 LAB — BASIC METABOLIC PANEL
Anion gap: 7 (ref 5–15)
BUN: 17 mg/dL (ref 8–23)
CO2: 30 mmol/L (ref 22–32)
Calcium: 9.2 mg/dL (ref 8.9–10.3)
Chloride: 104 mmol/L (ref 98–111)
Creatinine, Ser: 0.78 mg/dL (ref 0.44–1.00)
GFR calc Af Amer: >60 mL/min (ref >60)
GFR calc non Af Amer: >60 mL/min (ref >60)
Glucose, Bld: 89 mg/dL (ref 70–99)
Potassium: 4.9 mmol/L (ref 3.5–5.1)
Sodium: 141 mmol/L (ref 135–145)

## 2018-09-01 LAB — SURGICAL PCR SCREEN
MRSA, PCR: NEGATIVE
Staphylococcus aureus: POSITIVE — AB

## 2018-09-01 MED ORDER — ALENDRONATE SODIUM 70 MG PO TABS: 70.0000 mg | ORAL_TABLET | ORAL | 2 refills | Status: DC

## 2018-09-01 NOTE — Progress Notes (Signed)
Mupirocin called in to [facility] and hicone.  Left voicemail for Chelsea Griffith to start antibiotic treatment tonight then twice daily until last dose the morning of surgery.

## 2018-09-01 NOTE — Patient Instructions (Signed)
DUE TO COVID-19 NO VISITORS ARE ALLOWED IN THE HOSPITAL AT THIS TIME   COVID SWAB TESTING MUST BE COMPLETED ON: TOMORROW, Saturday, September 02, 2018 AT 10:25AM  (Must self quarantine after testing. Follow instructions on handout.)   Your procedure is scheduled on: [facility], September 06, 2018   Surgery Time:  11:08-1:20pm   Report to [place]  Entrance    Report to admitting at 8:35am AM   Call this number if you have problems the morning of surgery [phone]   Do not eat food  :After Midnight.    MAY HAVE LIQUIDS UNTIL 4:30AM DAY OF SURGERY   CLEAR LIQUID DIET  Foods Allowed                                                                     Foods Excluded  Water, Black Coffee and tea, regular and decaf                             liquids that you cannot  Plain Jell-O in any flavor                                             see through such as: Fruit ices (not with fruit pulp)                                     milk, soups, orange juice  Iced Popsicles                                    All solid food Carbonated beverages, regular and diet                                    Cranberry, grape and apple juices Sports drinks like Gatorade Lightly seasoned clear broth or consume(fat free) Sugar, honey syrup  Sample Menu Breakfast                                Lunch                                     Supper Cranberry juice                    Beef broth                            Chicken broth Jell-O                                     Grape juice  Apple juice Coffee or tea                        Jell-O                                      Popsicle                                                Coffee or tea                        Coffee or tea   Complete one Ensure drink the morning of surgery at  4:30AM the day of surgery.   Brush your teeth the morning of surgery.   Do NOT smoke after Midnight   Take these medicines the morning of  surgery with A SIP OF WATER: FLUOXETINE, PRAVASTATIN, SYMBICORT, QVAR   BRING RESCUE INHALER DAY OF SURGERY                               You may not have any metal on your body including hair pins, jewelry, and body piercings             Do not wear make-up, lotions, powders, perfumes/cologne, or deodorant             Do not wear nail polish.  Do not shave  48 hours prior to surgery.              Do not bring valuables to the hospital. [place].   Contacts, dentures or bridgework may not be worn into surgery.   Bring small overnight bag day of surgery.     Special Instructions: Bring a copy of your healthcare power of attorney and living will documents         the day of surgery if you haven't scanned them in before.              Please read over the following fact sheets you were given: [facility] - Preparing for Surgery Before surgery, you can play an important role.  Because skin is not sterile, your skin needs to be as free of germs as possible.  You can reduce the number of germs on your skin by washing with CHG (chlorahexidine gluconate) soap before surgery.  CHG is an antiseptic cleaner which kills germs and bonds with the skin to continue killing germs even after washing. Please DO NOT use if you have an allergy to CHG or antibacterial soaps.  If your skin becomes reddened/irritated stop using the CHG and inform your nurse when you arrive at Short Stay. Do not shave (including legs and underarms) for at least 48 hours prior to the first CHG shower.  You may shave your face/neck.  Please follow these instructions carefully:  1.  Shower with CHG Soap the night before surgery and the  morning of surgery.  2.  If you choose to wash your hair, wash your hair first as usual with your normal  shampoo.  3.  After you shampoo,  rinse your hair and body thoroughly to remove the shampoo.                             4.  Use CHG as you would  any other liquid soap.  You can apply chg directly to the skin and wash.  Gently with a scrungie or clean washcloth.  5.  Apply the CHG Soap to your body ONLY FROM THE NECK DOWN.   Do   not use on face/ open                           Wound or open sores. Avoid contact with eyes, ears mouth and   genitals (private parts).                       Wash face,  Genitals (private parts) with your normal soap.             6.  Wash thoroughly, paying special attention to the area where your    surgery  will be performed.  7.  Thoroughly rinse your body with warm water from the neck down.  8.  DO NOT shower/wash with your normal soap after using and rinsing off the CHG Soap.                9.  Pat yourself dry with a clean towel.            10.  Wear clean pajamas.            11.  Place clean sheets on your bed the night of your first shower and do not  sleep with pets. Day of Surgery : Do not apply any lotions/deodorants the morning of surgery.  Please wear clean clothes to the hospital/surgery center.  FAILURE TO FOLLOW THESE INSTRUCTIONS MAY RESULT IN THE CANCELLATION OF YOUR SURGERY  PATIENT SIGNATURE_________________________________  NURSE SIGNATURE__________________________________  ________________________________________________________________________   Adam Phenix  An incentive spirometer is a tool that can help keep your lungs clear and active. This tool measures how well you are filling your lungs with each breath. Taking long deep breaths may help reverse or decrease the chance of developing breathing (pulmonary) problems (especially infection) following:  A long period of time when you are unable to move or be active. BEFORE THE PROCEDURE   If the spirometer includes an indicator to show your best effort, your nurse or respiratory therapist will set it to a desired goal.  If possible, sit up straight or lean slightly forward. Try not to slouch.  Hold the incentive  spirometer in an upright position. INSTRUCTIONS FOR USE  1. Sit on the edge of your bed if possible, or sit up as far as you can in bed or on a chair. 2. Hold the incentive spirometer in an upright position. 3. Breathe out normally. 4. Place the mouthpiece in your mouth and seal your lips tightly around it. 5. Breathe in slowly and as deeply as possible, raising the piston or the ball toward the top of the column. 6. Hold your breath for 3-5 seconds or for as long as possible. Allow the piston or ball to fall to the bottom of the column. 7. Remove the mouthpiece from your mouth and breathe out normally. 8. Rest for a few seconds and repeat Steps 1 through 7 at least 10 times every 1-2 hours when  you are awake. Take your time and take a few normal breaths between deep breaths. 9. The spirometer may include an indicator to show your best effort. Use the indicator as a goal to work toward during each repetition. 10. After each set of 10 deep breaths, practice coughing to be sure your lungs are clear. If you have an incision (the cut made at the time of surgery), support your incision when coughing by placing a pillow or rolled up towels firmly against it. Once you are able to get out of bed, walk around indoors and cough well. You may stop using the incentive spirometer when instructed by your caregiver.  RISKS AND COMPLICATIONS  Take your time so you do not get dizzy or light-headed.  If you are in pain, you may need to take or ask for pain medication before doing incentive spirometry. It is harder to take a deep breath if you are having pain. AFTER USE  Rest and breathe slowly and easily.  It can be helpful to keep track of a log of your progress. Your caregiver can provide you with a simple table to help with this. If you are using the spirometer at home, follow these instructions: [place] IF:   You are having difficultly using the spirometer.  You have trouble using the  spirometer as often as instructed.  Your pain medication is not giving enough relief while using the spirometer.  You develop fever of 100.5 F (38.1 C) or higher. SEEK IMMEDIATE MEDICAL CARE IF:   You cough up bloody sputum that had not been present before.  You develop fever of 102 F (38.9 C) or greater.  You develop worsening pain at or near the incision site. MAKE SURE YOU:   Understand these instructions.  Will watch your condition.  Will get help right away if you are not doing well or get worse. Document Released: 07/05/2006 Document Revised: 05/17/2011 Document Reviewed: 09/05/2006 [facility] Patient Information 2014 [place], [place].   ________________________________________________________________________

## 2018-09-02 ENCOUNTER — Other Ambulatory Visit (HOSPITAL_COMMUNITY)
Admission: RE | Admit: 2018-09-02 | Discharge: 2018-09-02 | Disposition: A | Discharge status: Home / Self Care | Source: Ambulatory Visit | Attending: Orthopaedic Surgery | Admitting: Orthopaedic Surgery

## 2018-09-02 DIAGNOSIS — Z1159 Encounter for screening for other viral diseases: Secondary | ICD-10-CM | POA: Diagnosis present

## 2018-09-02 LAB — SARS CORONAVIRUS 2 (TAT 6-24 HRS): SARS Coronavirus 2: NEGATIVE

## 2018-09-06 ENCOUNTER — Inpatient Hospital Stay (HOSPITAL_COMMUNITY): Admitting: Certified Registered"

## 2018-09-06 ENCOUNTER — Other Ambulatory Visit: Payer: Self-pay

## 2018-09-06 ENCOUNTER — Inpatient Hospital Stay (HOSPITAL_COMMUNITY)
Admission: RE | Admit: 2018-09-06 | Discharge: 2018-09-07 | DRG: 483 | Disposition: A | Discharge status: Home / Self Care | Attending: Orthopaedic Surgery | Admitting: Orthopaedic Surgery

## 2018-09-06 ENCOUNTER — Inpatient Hospital Stay (HOSPITAL_COMMUNITY): Admitting: Physician Assistant

## 2018-09-06 ENCOUNTER — Encounter (HOSPITAL_COMMUNITY)
Admission: RE | Disposition: A | Discharge status: Home / Self Care | Payer: Self-pay | Source: Home / Self Care | Attending: Orthopaedic Surgery

## 2018-09-06 ENCOUNTER — Encounter (HOSPITAL_COMMUNITY): Payer: Self-pay | Admitting: *Deleted

## 2018-09-06 ENCOUNTER — Inpatient Hospital Stay (HOSPITAL_COMMUNITY)

## 2018-09-06 DIAGNOSIS — J45909 Unspecified asthma, uncomplicated: Secondary | ICD-10-CM | POA: Diagnosis present

## 2018-09-06 DIAGNOSIS — F329 Major depressive disorder, single episode, unspecified: Secondary | ICD-10-CM | POA: Diagnosis present

## 2018-09-06 DIAGNOSIS — M75101 Unspecified rotator cuff tear or rupture of right shoulder, not specified as traumatic: Secondary | ICD-10-CM | POA: Diagnosis present

## 2018-09-06 DIAGNOSIS — M12811 Other specific arthropathies, not elsewhere classified, right shoulder: Secondary | ICD-10-CM | POA: Diagnosis present

## 2018-09-06 DIAGNOSIS — M19011 Primary osteoarthritis, right shoulder: Secondary | ICD-10-CM | POA: Diagnosis present

## 2018-09-06 DIAGNOSIS — M810 Age-related osteoporosis without current pathological fracture: Secondary | ICD-10-CM | POA: Diagnosis present

## 2018-09-06 DIAGNOSIS — Z90711 Acquired absence of uterus with remaining cervical stump: Secondary | ICD-10-CM

## 2018-09-06 DIAGNOSIS — F419 Anxiety disorder, unspecified: Secondary | ICD-10-CM | POA: Diagnosis present

## 2018-09-06 DIAGNOSIS — E785 Hyperlipidemia, unspecified: Secondary | ICD-10-CM | POA: Diagnosis present

## 2018-09-06 DIAGNOSIS — K219 Gastro-esophageal reflux disease without esophagitis: Secondary | ICD-10-CM | POA: Diagnosis present

## 2018-09-06 DIAGNOSIS — Z09 Encounter for follow-up examination after completed treatment for conditions other than malignant neoplasm: Secondary | ICD-10-CM

## 2018-09-06 HISTORY — PX: REVERSE SHOULDER ARTHROPLASTY: SHX5054

## 2018-09-06 SURGERY — ARTHROPLASTY, SHOULDER, TOTAL, REVERSE
Anesthesia: General | Site: Shoulder | Laterality: Right

## 2018-09-06 MED ORDER — METOCLOPRAMIDE HCL 5 MG/ML IJ SOLN: 5.0000 mg | Freq: Three times a day (TID) | INTRAMUSCULAR | Status: DC | PRN

## 2018-09-06 MED ORDER — MIDAZOLAM HCL 2 MG/2ML IJ SOLN
1.0000 mg | Freq: Once | INTRAMUSCULAR | Status: AC
  Administered 2018-09-06: 2 mg via INTRAVENOUS
  Filled 2018-09-06: qty 2

## 2018-09-06 MED ORDER — PHENYLEPHRINE 40 MCG/ML (10ML) SYRINGE FOR IV PUSH (FOR BLOOD PRESSURE SUPPORT)
PREFILLED_SYRINGE | INTRAVENOUS | Status: DC | PRN
  Administered 2018-09-06 (×3): 80 ug via INTRAVENOUS
  Administered 2018-09-06: 40 ug via INTRAVENOUS
  Administered 2018-09-06: 80 ug via INTRAVENOUS

## 2018-09-06 MED ORDER — SUCCINYLCHOLINE CHLORIDE 200 MG/10ML IV SOSY
PREFILLED_SYRINGE | INTRAVENOUS | Status: DC | PRN
  Administered 2018-09-06: 120 mg via INTRAVENOUS

## 2018-09-06 MED ORDER — VANCOMYCIN HCL POWD
Status: DC | PRN
  Administered 2018-09-06: 1000 mg

## 2018-09-06 MED ORDER — LEVOCETIRIZINE DIHYDROCHLORIDE 5 MG PO TABS: 5.0000 mg | ORAL_TABLET | Freq: Every evening | ORAL | Status: DC

## 2018-09-06 MED ORDER — OXYCODONE HCL 5 MG/5ML PO SOLN: 5.0000 mg | Freq: Once | ORAL | Status: DC | PRN

## 2018-09-06 MED ORDER — SUGAMMADEX SODIUM 200 MG/2ML IV SOLN
INTRAVENOUS | Status: DC | PRN
  Administered 2018-09-06: 130 mg via INTRAVENOUS

## 2018-09-06 MED ORDER — PROMETHAZINE HCL 25 MG/ML IJ SOLN: 6.2500 mg | INTRAMUSCULAR | Status: DC | PRN

## 2018-09-06 MED ORDER — ONDANSETRON HCL 4 MG/2ML IJ SOLN: 4.0000 mg | Freq: Four times a day (QID) | INTRAMUSCULAR | Status: DC | PRN

## 2018-09-06 MED ORDER — EPHEDRINE 5 MG/ML INJ
INTRAVENOUS | Status: AC
  Filled 2018-09-06: qty 10

## 2018-09-06 MED ORDER — PHENYLEPHRINE HCL (PRESSORS) 10 MG/ML IV SOLN
INTRAVENOUS | Status: AC
  Filled 2018-09-06: qty 1

## 2018-09-06 MED ORDER — METOCLOPRAMIDE HCL 5 MG PO TABS: 5.0000 mg | ORAL_TABLET | Freq: Three times a day (TID) | ORAL | Status: DC | PRN

## 2018-09-06 MED ORDER — OXYCODONE HCL 5 MG PO TABS: 5.0000 mg | ORAL_TABLET | Freq: Once | ORAL | Status: DC | PRN

## 2018-09-06 MED ORDER — SUCCINYLCHOLINE CHLORIDE 200 MG/10ML IV SOSY
PREFILLED_SYRINGE | INTRAVENOUS | Status: AC
  Filled 2018-09-06: qty 10

## 2018-09-06 MED ORDER — CHLORHEXIDINE GLUCONATE 4 % EX LIQD: 60.0000 mL | Freq: Once | CUTANEOUS | Status: DC

## 2018-09-06 MED ORDER — FLUOXETINE HCL 20 MG PO CAPS
20.0000 mg | ORAL_CAPSULE | Freq: Every day | ORAL | Status: DC
  Administered 2018-09-06 – 2018-09-07 (×2): 20 mg via ORAL
  Filled 2018-09-06 (×2): qty 1

## 2018-09-06 MED ORDER — BUPIVACAINE LIPOSOME 1.3 % IJ SUSP
INTRAMUSCULAR | Status: DC | PRN
  Administered 2018-09-06: 10 mL via PERINEURAL

## 2018-09-06 MED ORDER — PROPOFOL 10 MG/ML IV BOLUS
INTRAVENOUS | Status: AC
  Filled 2018-09-06: qty 20

## 2018-09-06 MED ORDER — 0.9 % SODIUM CHLORIDE (POUR BTL) OPTIME
TOPICAL | Status: DC | PRN
  Administered 2018-09-06: 12:00:00 1000 mL

## 2018-09-06 MED ORDER — ONDANSETRON HCL 4 MG/2ML IJ SOLN
INTRAMUSCULAR | Status: DC | PRN
  Administered 2018-09-06: 4 mg via INTRAVENOUS

## 2018-09-06 MED ORDER — MONTELUKAST SODIUM 10 MG PO TABS
10.0000 mg | ORAL_TABLET | Freq: Every evening | ORAL | Status: DC
  Administered 2018-09-06: 10 mg via ORAL
  Filled 2018-09-06: qty 1

## 2018-09-06 MED ORDER — CELECOXIB 200 MG PO CAPS
200.0000 mg | ORAL_CAPSULE | Freq: Two times a day (BID) | ORAL | Status: DC
  Administered 2018-09-06 – 2018-09-07 (×3): 200 mg via ORAL
  Filled 2018-09-06 (×3): qty 1

## 2018-09-06 MED ORDER — ROCURONIUM BROMIDE 10 MG/ML (PF) SYRINGE
PREFILLED_SYRINGE | INTRAVENOUS | Status: DC | PRN
  Administered 2018-09-06: 50 mg via INTRAVENOUS

## 2018-09-06 MED ORDER — FENTANYL CITRATE (PF) 100 MCG/2ML IJ SOLN
50.0000 ug | Freq: Once | INTRAMUSCULAR | Status: AC
  Administered 2018-09-06: 100 ug via INTRAVENOUS
  Filled 2018-09-06: qty 2

## 2018-09-06 MED ORDER — LIDOCAINE 2% (20 MG/ML) 5 ML SYRINGE
INTRAMUSCULAR | Status: DC | PRN
  Administered 2018-09-06: 80 mg via INTRAVENOUS

## 2018-09-06 MED ORDER — LIDOCAINE 2% (20 MG/ML) 5 ML SYRINGE
INTRAMUSCULAR | Status: AC
  Filled 2018-09-06: qty 5

## 2018-09-06 MED ORDER — VANCOMYCIN HCL 1000 MG IV SOLR
INTRAVENOUS | Status: AC
  Filled 2018-09-06: qty 1000

## 2018-09-06 MED ORDER — CEFAZOLIN SODIUM-DEXTROSE 2-4 GM/100ML-% IV SOLN
2.0000 g | INTRAVENOUS | Status: AC
  Administered 2018-09-06: 2 g via INTRAVENOUS
  Filled 2018-09-06: qty 100

## 2018-09-06 MED ORDER — HYDROMORPHONE HCL 1 MG/ML IJ SOLN: 0.5000 mg | INTRAMUSCULAR | Status: DC | PRN

## 2018-09-06 MED ORDER — PHENYLEPHRINE 40 MCG/ML (10ML) SYRINGE FOR IV PUSH (FOR BLOOD PRESSURE SUPPORT)
PREFILLED_SYRINGE | INTRAVENOUS | Status: AC
  Filled 2018-09-06: qty 10

## 2018-09-06 MED ORDER — ROCURONIUM BROMIDE 10 MG/ML (PF) SYRINGE
PREFILLED_SYRINGE | INTRAVENOUS | Status: AC
  Filled 2018-09-06: qty 10

## 2018-09-06 MED ORDER — DIPHENHYDRAMINE HCL 12.5 MG/5ML PO ELIX: 12.5000 mg | ORAL_SOLUTION | ORAL | Status: DC | PRN

## 2018-09-06 MED ORDER — SODIUM CHLORIDE 0.9 % IV SOLN
INTRAVENOUS | Status: DC | PRN
  Administered 2018-09-06: 25 ug/min via INTRAVENOUS

## 2018-09-06 MED ORDER — DEXAMETHASONE SODIUM PHOSPHATE 10 MG/ML IJ SOLN
INTRAMUSCULAR | Status: AC
  Filled 2018-09-06: qty 1

## 2018-09-06 MED ORDER — SODIUM CHLORIDE 0.9 % IR SOLN
Status: DC | PRN
  Administered 2018-09-06: 1000 mL

## 2018-09-06 MED ORDER — BUDESONIDE 0.5 MG/2ML IN SUSP
0.5000 mg | Freq: Two times a day (BID) | RESPIRATORY_TRACT | Status: DC
  Administered 2018-09-06 – 2018-09-07 (×2): 0.5 mg via RESPIRATORY_TRACT
  Filled 2018-09-06 (×2): qty 2

## 2018-09-06 MED ORDER — MEPERIDINE HCL 50 MG/ML IJ SOLN: 6.2500 mg | INTRAMUSCULAR | Status: DC | PRN

## 2018-09-06 MED ORDER — PROPOFOL 10 MG/ML IV BOLUS
INTRAVENOUS | Status: DC | PRN
  Administered 2018-09-06: 130 mg via INTRAVENOUS

## 2018-09-06 MED ORDER — FENTANYL CITRATE (PF) 100 MCG/2ML IJ SOLN
INTRAMUSCULAR | Status: AC
  Filled 2018-09-06: qty 2

## 2018-09-06 MED ORDER — CEFAZOLIN SODIUM-DEXTROSE 1-4 GM/50ML-% IV SOLN
1.0000 g | Freq: Four times a day (QID) | INTRAVENOUS | Status: AC
  Administered 2018-09-06 – 2018-09-07 (×3): 1 g via INTRAVENOUS
  Filled 2018-09-06 (×3): qty 50

## 2018-09-06 MED ORDER — DEXAMETHASONE SODIUM PHOSPHATE 10 MG/ML IJ SOLN
INTRAMUSCULAR | Status: DC | PRN
  Administered 2018-09-06: 10 mg via INTRAVENOUS

## 2018-09-06 MED ORDER — HYDROMORPHONE HCL 1 MG/ML IJ SOLN: 0.2500 mg | INTRAMUSCULAR | Status: DC | PRN

## 2018-09-06 MED ORDER — DOCUSATE SODIUM 100 MG PO CAPS
100.0000 mg | ORAL_CAPSULE | Freq: Two times a day (BID) | ORAL | Status: DC
  Administered 2018-09-06 – 2018-09-07 (×2): 100 mg via ORAL
  Filled 2018-09-06 (×2): qty 1

## 2018-09-06 MED ORDER — CLONAZEPAM 1 MG PO TABS
1.0000 mg | ORAL_TABLET | Freq: Every evening | ORAL | Status: DC
  Administered 2018-09-06: 1 mg via ORAL
  Filled 2018-09-06: qty 1

## 2018-09-06 MED ORDER — LACTATED RINGERS IV SOLN
INTRAVENOUS | Status: DC
  Administered 2018-09-06 (×2): via INTRAVENOUS

## 2018-09-06 MED ORDER — TRAZODONE HCL 100 MG PO TABS
100.0000 mg | ORAL_TABLET | Freq: Every day | ORAL | Status: DC
  Administered 2018-09-06: 21:00:00 100 mg via ORAL
  Filled 2018-09-06: qty 1

## 2018-09-06 MED ORDER — LORATADINE 10 MG PO TABS
10.0000 mg | ORAL_TABLET | Freq: Every evening | ORAL | Status: DC
  Administered 2018-09-06: 10 mg via ORAL
  Filled 2018-09-06: qty 1

## 2018-09-06 MED ORDER — MOMETASONE FURO-FORMOTEROL FUM 200-5 MCG/ACT IN AERO
2.0000 | INHALATION_SPRAY | Freq: Two times a day (BID) | RESPIRATORY_TRACT | Status: DC
  Filled 2018-09-06: qty 8.8

## 2018-09-06 MED ORDER — ONDANSETRON HCL 4 MG PO TABS: 4.0000 mg | ORAL_TABLET | Freq: Four times a day (QID) | ORAL | Status: DC | PRN

## 2018-09-06 MED ORDER — PRAVASTATIN SODIUM 20 MG PO TABS
20.0000 mg | ORAL_TABLET | Freq: Every evening | ORAL | Status: DC
  Administered 2018-09-06: 20 mg via ORAL
  Filled 2018-09-06: qty 1

## 2018-09-06 MED ORDER — ACETAMINOPHEN 500 MG PO TABS
1000.0000 mg | ORAL_TABLET | Freq: Three times a day (TID) | ORAL | Status: DC
  Administered 2018-09-06 – 2018-09-07 (×3): 1000 mg via ORAL
  Filled 2018-09-06 (×3): qty 2

## 2018-09-06 MED ORDER — STERILE WATER FOR IRRIGATION IR SOLN
Status: DC | PRN
  Administered 2018-09-06: 2000 mL

## 2018-09-06 MED ORDER — BUPIVACAINE HCL (PF) 0.5 % IJ SOLN
INTRAMUSCULAR | Status: DC | PRN
  Administered 2018-09-06: 20 mL via PERINEURAL

## 2018-09-06 MED ORDER — ZOLPIDEM TARTRATE 5 MG PO TABS: 5.0000 mg | ORAL_TABLET | Freq: Every evening | ORAL | Status: DC | PRN

## 2018-09-06 MED ORDER — EPHEDRINE SULFATE-NACL 50-0.9 MG/10ML-% IV SOSY
PREFILLED_SYRINGE | INTRAVENOUS | Status: DC | PRN
  Administered 2018-09-06: 5 mg via INTRAVENOUS
  Administered 2018-09-06: 10 mg via INTRAVENOUS
  Administered 2018-09-06: 5 mg via INTRAVENOUS
  Administered 2018-09-06: 10 mg via INTRAVENOUS

## 2018-09-06 MED ORDER — SUGAMMADEX SODIUM 500 MG/5ML IV SOLN
INTRAVENOUS | Status: AC
  Filled 2018-09-06: qty 5

## 2018-09-06 MED ORDER — ONDANSETRON HCL 4 MG/2ML IJ SOLN
INTRAMUSCULAR | Status: AC
  Filled 2018-09-06: qty 2

## 2018-09-06 MED ORDER — OXYCODONE HCL 5 MG PO TABS: 5.0000 mg | ORAL_TABLET | ORAL | Status: DC | PRN

## 2018-09-06 MED ORDER — TRANEXAMIC ACID-NACL 1000-0.7 MG/100ML-% IV SOLN
1000.0000 mg | INTRAVENOUS | Status: AC
  Administered 2018-09-06: 1000 mg via INTRAVENOUS
  Filled 2018-09-06: qty 100

## 2018-09-06 MED ORDER — PANTOPRAZOLE SODIUM 40 MG PO TBEC
40.0000 mg | DELAYED_RELEASE_TABLET | Freq: Every day | ORAL | Status: DC
  Administered 2018-09-06: 40 mg via ORAL
  Filled 2018-09-06: qty 1

## 2018-09-06 MED FILL — Vancomycin HCl For IV Soln 1 GM (Base Equivalent): INTRAVENOUS | Qty: 1000 | Status: AC

## 2018-09-06 SURGICAL SUPPLY — 68 items
AID PSTN UNV HD RSTRNT DISP (MISCELLANEOUS) ×1
APL PRP STRL LF DISP 70% ISPRP (MISCELLANEOUS) ×2
BASEPLATE GLENOSPHERE 25 STD (Miscellaneous) ×1 IMPLANT
BASEPLATE GLENOSPHERE 25MM STD (Miscellaneous) ×1 IMPLANT
BIT DRILL 3.2 PERIPHERAL SCREW (BIT) ×2 IMPLANT
BLADE EXTENDED COATED 6.5IN (ELECTRODE) IMPLANT
BLADE SAW SAG 73X25 THK (BLADE) ×2
BLADE SAW SGTL 73X25 THK (BLADE) ×1 IMPLANT
BONE SCREW THREAD 6.5X35MM (Screw) ×1 IMPLANT
BSPLAT GLND STD 25 RVRS SHLDR (Miscellaneous) ×1 IMPLANT
CHLORAPREP W/TINT 26 (MISCELLANEOUS) ×6 IMPLANT
CLOSURE STERI-STRIP 1/2X4 (GAUZE/BANDAGES/DRESSINGS) ×1
CLSR STERI-STRIP ANTIMIC 1/2X4 (GAUZE/BANDAGES/DRESSINGS) ×2 IMPLANT
COOLER ICEMAN CLASSIC (MISCELLANEOUS) IMPLANT
COVER SURGICAL LIGHT HANDLE (MISCELLANEOUS) ×3 IMPLANT
COVER WAND RF STERILE (DRAPES) ×3 IMPLANT
DRAPE INCISE IOBAN 66X45 STRL (DRAPES) ×3 IMPLANT
DRAPE ORTHO SPLIT 77X108 STRL (DRAPES) ×6
DRAPE SHEET LG 3/4 BI-LAMINATE (DRAPES) ×3 IMPLANT
DRAPE SURG ORHT 6 SPLT 77X108 (DRAPES) ×2 IMPLANT
DRSG AQUACEL AG ADV 3.5X 6 (GAUZE/BANDAGES/DRESSINGS) ×3 IMPLANT
ELECT BLADE TIP CTD 4 INCH (ELECTRODE) ×3 IMPLANT
ELECT REM PT RETURN 15FT ADLT (MISCELLANEOUS) ×3 IMPLANT
GLENOSPHERE REV SHOULDER 36 (Joint) ×2 IMPLANT
GLOVE BIO SURGEON STRL SZ8 (GLOVE) ×3 IMPLANT
GLOVE BIOGEL PI IND STRL 8 (GLOVE) ×2 IMPLANT
GLOVE BIOGEL PI INDICATOR 8 (GLOVE) ×4
GLOVE ECLIPSE 8.0 STRL XLNG CF (GLOVE) ×6 IMPLANT
GOWN SPEC L3 XXLG W/TWL (GOWN DISPOSABLE) ×3 IMPLANT
GOWN STRL REUS W/ TWL XL LVL3 (GOWN DISPOSABLE) ×1 IMPLANT
GOWN STRL REUS W/TWL XL LVL3 (GOWN DISPOSABLE) ×3
GUIDEWIRE GLENOID 2.5X220 (WIRE) ×2 IMPLANT
HANDPIECE INTERPULSE COAX TIP (DISPOSABLE) ×3
HEMOSTAT SURGICEL 2X14 (HEMOSTASIS) IMPLANT
IMPL REVERSE SHOULDER 0X3.5 (Shoulder) IMPLANT
IMPLANT REVERSE SHOULDER 0X3.5 (Shoulder) ×3 IMPLANT
INSERT HUMERAL 36X6MM 12.5DEG (Insert) ×2 IMPLANT
KIT BASIN OR (CUSTOM PROCEDURE TRAY) ×3 IMPLANT
KIT STABILIZATION SHOULDER (MISCELLANEOUS) ×3 IMPLANT
KIT TURNOVER KIT A (KITS) IMPLANT
MANIFOLD NEPTUNE II (INSTRUMENTS) ×3 IMPLANT
NDL HYPO 25X1 1.5 SAFETY (NEEDLE) IMPLANT
NDL MAYO CATGUT SZ4 TPR NDL (NEEDLE) IMPLANT
NEEDLE HYPO 25X1 1.5 SAFETY (NEEDLE) IMPLANT
NEEDLE MAYO CATGUT SZ4 (NEEDLE) IMPLANT
NS IRRIG 1000ML POUR BTL (IV SOLUTION) ×3 IMPLANT
PACK SHOULDER (CUSTOM PROCEDURE TRAY) ×3 IMPLANT
PAD COLD SHLDR WRAP-ON (PAD) ×2 IMPLANT
RESTRAINT HEAD UNIVERSAL NS (MISCELLANEOUS) ×3 IMPLANT
SCREW 5.5X26 (Screw) ×2 IMPLANT
SCREW BONE THREAD 6.5X35 (Screw) ×1 IMPLANT
SCREW PERIPHERAL 42 (Screw) ×2 IMPLANT
SET HNDPC FAN SPRY TIP SCT (DISPOSABLE) ×1 IMPLANT
SLING ULTRA II S (ORTHOPEDIC SUPPLIES) ×2 IMPLANT
SLING ULTRA III MED (ORTHOPEDIC SUPPLIES) ×3 IMPLANT
SPONGE LAP 18X18 X RAY DECT (DISPOSABLE) IMPLANT
STEM HUMERAL SZ4B STD 78 PTC (Stem) ×2 IMPLANT
SUCTION FRAZIER HANDLE 12FR (TUBING) ×2
SUCTION TUBE FRAZIER 12FR DISP (TUBING) ×1 IMPLANT
SUT ETHIBOND 2 V 37 (SUTURE) ×3 IMPLANT
SUT ETHIBOND NAB CT1 #1 30IN (SUTURE) ×3 IMPLANT
SUT FIBERWIRE #5 38 CONV NDL (SUTURE) ×12
SUT MON AB 3-0 SH 27 (SUTURE) ×3
SUT MON AB 3-0 SH27 (SUTURE) ×1 IMPLANT
SUT VIC AB 0 CT1 36 (SUTURE) ×3 IMPLANT
SUTURE FIBERWR #5 38 CONV NDL (SUTURE) ×4 IMPLANT
TOWEL OR 17X26 10 PK STRL BLUE (TOWEL DISPOSABLE) ×3 IMPLANT
WATER STERILE IRR 1000ML POUR (IV SOLUTION) ×6 IMPLANT

## 2018-09-06 NOTE — Anesthesia Procedure Notes (Signed)
Procedure Name: Intubation Date/Time: 09/06/2018 11:03 AM Performed by: Niel Hummer, CRNA Pre-anesthesia Checklist: Patient being monitored, Suction available, Emergency Drugs available and Patient identified Patient Re-evaluated:Patient Re-evaluated prior to induction Oxygen Delivery Method: Circle system utilized Preoxygenation: Pre-oxygenation with 100% oxygen Induction Type: IV induction Laryngoscope Size: Mac and 4 Grade View: Grade I Tube type: Oral Tube size: 7.0 mm Number of attempts: 1 Airway Equipment and Method: Stylet Placement Confirmation: ETT inserted through vocal cords under direct vision,  positive ETCO2 and breath sounds checked- equal and bilateral Secured at: 21 cm Tube secured with: Tape Dental Injury: Teeth and Oropharynx as per pre-operative assessment

## 2018-09-06 NOTE — H&P (Signed)
PREOPERATIVE H&P  Chief Complaint: djd right shoulder  HPI: Chelsea Griffith is a 63 y.o. female who presents for preoperative history and physical with a diagnosis of djd right shoulder. Symptoms are rated as moderate to severe, and have been worsening.  This is significantly impairing activities of daily living.  Please see my clinic note for full details on this patient's care.  She has elected for surgical management.   Past Medical History:  Diagnosis Date  . Anxiety   . Arthritis    fingers and thumbs  . Asthma   . Depression   . GERD (gastroesophageal reflux disease)   . History of anemia   . Hyperlipidemia   . Osteoporosis    right hip  . Rotator cuff tear, right    Past Surgical History:  Procedure Laterality Date  . ABDOMINAL HYSTERECTOMY    . CESAREAN SECTION      2 times  . COLONOSCOPY    . DILATION AND CURETTAGE OF UTERUS     several  . ELBOW SURGERY Bilateral     2times/1 time on left elbow  . KNEE SURGERY     right knee  . PARTIAL HYSTERECTOMY     2 times  . ROTATOR CUFF REPAIR     right shoulderx2    Social History   Socioeconomic History  . Marital status: Widowed    Spouse name: Not on file  . Number of children: Not on file  . Years of education: Not on file  . Highest education level: Not on file  Occupational History  . Not on file  Social Needs  . Financial resource strain: Not on file  . Food insecurity    Worry: Not on file    Inability: Not on file  . Transportation needs    Medical: Not on file    Non-medical: Not on file  Tobacco Use  . Smoking status: Never Smoker  . Smokeless tobacco: Never Used  Substance and Sexual Activity  . Alcohol use: Yes    Alcohol/week: 1.0 standard drinks    Types: 1 Shots of liquor per week    Comment: occasional weekly drink  . Drug use: No  . Sexual activity: Yes    Comment: single  Lifestyle  . Physical activity    Days per week: Not on file    Minutes per session: Not on file  .  Stress: Not on file  Relationships  . Social Herbalist on phone: Not on file    Gets together: Not on file    Attends religious service: Not on file    Active member of club or organization: Not on file    Attends meetings of clubs or organizations: Not on file    Relationship status: Not on file  Other Topics Concern  . Not on file  Social History Narrative  . Not on file   Family History  Problem Relation Age of Onset  . Hyperlipidemia Mother   . ADD / ADHD Son   . Heart disease Maternal Grandmother   . Colon cancer Neg Hx   . Esophageal cancer Neg Hx   . Rectal cancer Neg Hx   . Stomach cancer Neg Hx    Allergies  Allergen Reactions  . Codeine Itching  . Milk-Related Compounds Nausea And Vomiting    Just milk, as an infant dropped significant amount of weight   . Other     Smells may cause asthma atack  Prior to Admission medications   Medication Sig Start Date End Date Taking? Authorizing Provider  Albuterol Sulfate 108 (90 Base) MCG/ACT AEPB Inhale 2 puffs into the lungs every 4 (four) hours as needed (wheeze, SOB, coughing fits). 03/07/18  Yes Delsa Grana, PA-C  alendronate (FOSAMAX) 70 MG tablet Take 1 tablet (70 mg total) by mouth once a week. Take with a full glass of water on an empty stomach. 09/01/18  Yes Pickard, Cammie Mcgee, MD  beclomethasone (QVAR) 80 MCG/ACT inhaler INHALE 2 PUFFS INTO THE LUNGS 2 (TWO) TIMES DAILY. Patient taking differently: Inhale 2 puffs into the lungs 2 (two) times daily as needed (shortness of breath).  03/16/18  Yes Delsa Grana, PA-C  budesonide-formoterol (SYMBICORT) 160-4.5 MCG/ACT inhaler Inhale 2 puffs into the lungs 2 (two) times daily as needed (shortness of breath).   Yes [provider]  calcium-vitamin D (OSCAL WITH D) 500-200 MG-UNIT per tablet Take 1 tablet by mouth daily at 2 PM.    Yes [provider]  clonazePAM (KLONOPIN) 1 MG tablet Take 1 mg by mouth every evening.  04/10/13  Yes [provider]  FLUoxetine (PROZAC) 10 MG tablet Take 20 mg by mouth daily.   Yes [provider]  fluticasone (FLONASE) 50 MCG/ACT nasal spray Place 2 sprays into both nostrils daily. Patient taking differently: Place 2 sprays into both nostrils daily as needed for allergies.  06/26/18  Yes Susy Frizzle, MD  levocetirizine (XYZAL) 5 MG tablet TAKE 1 TABLET BY MOUTH EVERY DAY IN THE EVENING Patient taking differently: Take 5 mg by mouth every evening.  07/19/18  Yes Susy Frizzle, MD  montelukast (SINGULAIR) 10 MG tablet TAKE 1 TABLET BY MOUTH EVERYDAY AT BEDTIME Patient taking differently: Take 10 mg by mouth every evening.  05/29/18  Yes Susy Frizzle, MD  Multiple Vitamin (MULTIVITAMIN WITH MINERALS) TABS tablet Take 2 tablets by mouth daily.   Yes [provider]  Multiple Vitamins-Minerals (PRESERVISION AREDS 2) CAPS Take 1 capsule by mouth 2 (two) times a day.   Yes [provider]  naproxen sodium (ALEVE) 220 MG tablet Take 440 mg by mouth 2 (two) times daily as needed (pain).   Yes [provider]  pantoprazole (PROTONIX) 40 MG tablet TAKE 1 TABLET BY MOUTH EVERY DAY Patient taking differently: Take 40 mg by mouth at bedtime.  04/17/18  Yes Susy Frizzle, MD  pravastatin (PRAVACHOL) 20 MG tablet TAKE 1 TABLET BY MOUTH EVERY DAY Patient taking differently: Take 20 mg by mouth daily.  04/17/18  Yes Susy Frizzle, MD  traZODone (DESYREL) 100 MG tablet Take 100 mg by mouth at bedtime.  03/02/13  Yes [provider]  amoxicillin (AMOXIL) 875 MG tablet Take 1 tablet (875 mg total) by mouth 2 (two) times daily. Patient not taking: Reported on 08/25/2018 06/26/18   Susy Frizzle, MD  celecoxib (CELEBREX) 200 MG capsule TAKE ONE CAPSULE BY MOUTH EVERY DAY Patient not taking: Reported on 08/25/2018 05/20/16   Susy Frizzle, MD     Positive ROS: All other systems have been reviewed and were otherwise negative with the exception of  those mentioned in the HPI and as above.  Physical Exam: General: Alert, no acute distress Cardiovascular: No pedal edema Respiratory: No cyanosis, no use of accessory musculature GI: No organomegaly, abdomen is soft and non-tender Skin: No lesions in the area of chief complaint Neurologic: Sensation intact distally Psychiatric: Patient is competent for consent with normal mood  and affect Lymphatic: No axillary or cervical lymphadenopathy  MUSCULOSKELETAL: R shoulder painful ROM - previous cuff repair  Assessment: djd right shoulder  Plan: Plan for Procedure(s): REVERSE SHOULDER ARTHROPLASTY  The risks benefits and alternatives were discussed with the patient including but not limited to the risks of nonoperative treatment, versus surgical intervention including infection, bleeding, nerve injury,  blood clots, cardiopulmonary complications, morbidity, mortality, among others, and they were willing to proceed.   Hiram Gash, MD  09/06/2018 10:13 AM

## 2018-09-06 NOTE — Anesthesia Preprocedure Evaluation (Signed)
Anesthesia Evaluation  Patient identified by MRN, date of birth, ID band Patient awake    Reviewed: Allergy & Precautions, NPO status , Patient's Chart, lab work & pertinent test results  Airway Mallampati: II  TM Distance: >3 FB Neck ROM: Full    Dental no notable dental hx.    Pulmonary asthma ,    Pulmonary exam normal breath sounds clear to auscultation       Cardiovascular negative cardio ROS Normal cardiovascular exam Rhythm:Regular Rate:Normal     Neuro/Psych Anxiety Depression negative neurological ROS  negative psych ROS   GI/Hepatic Neg liver ROS, GERD  ,  Endo/Other  negative endocrine ROS  Renal/GU negative Renal ROS  negative genitourinary   Musculoskeletal  (+) Arthritis , Osteoarthritis,    Abdominal   Peds negative pediatric ROS (+)  Hematology negative hematology ROS (+)   Anesthesia Other Findings   Reproductive/Obstetrics negative OB ROS                             Anesthesia Physical Anesthesia Plan  ASA: II  Anesthesia Plan: General   Post-op Pain Management:  Regional for Post-op pain   Induction: Intravenous  PONV Risk Score and Plan: 3 and Ondansetron, Dexamethasone, Midazolam and Treatment may vary due to age or medical condition  Airway Management Planned: Oral ETT  Additional Equipment:   Intra-op Plan:   Post-operative Plan: Extubation in OR  Informed Consent: I have reviewed the patients History and Physical, chart, labs and discussed the procedure including the risks, benefits and alternatives for the proposed anesthesia with the patient or authorized representative who has indicated his/her understanding and acceptance.     Dental advisory given  Plan Discussed with: CRNA  Anesthesia Plan Comments:         Anesthesia Quick Evaluation

## 2018-09-06 NOTE — Anesthesia Procedure Notes (Signed)
Anesthesia Regional Block: Interscalene brachial plexus block   Pre-Anesthetic Checklist: ,, timeout performed, Correct Patient, Correct Site, Correct Laterality, Correct Procedure, Correct Position, site marked, Risks and benefits discussed,  Surgical consent,  Pre-op evaluation,  At surgeon's request and post-op pain management  Laterality: Right  Prep: chloraprep       Needles:  Injection technique: Single-shot  Needle Type: Stimiplex     Needle Length: 9cm  Needle Gauge: 21     Additional Needles:   Procedures:,,,, ultrasound used (permanent image in chart),,,,  Narrative:  Start time: 09/06/2018 10:14 AM End time: 09/06/2018 10:19 AM Injection made incrementally with aspirations every 5 mL.  Performed by: Personally  Anesthesiologist: Lynda Rainwater, MD

## 2018-09-06 NOTE — Op Note (Signed)
Orthopaedic Surgery Operative Note (CSN: [id number])  Chelsea Griffith  [birth year] Date of Surgery: 09/06/2018   Diagnoses:  Right irreparable cuff tear and continued pain  Procedure: Right reverse total Shoulder Arthroplasty   Operative Finding Successful completion of planned procedure.  Actually nerve is intact at the end of the case and the tug test was performed.  Patient had significant bone loss on the lateral aspect of the proximal humeral shaft and tuberosity and multiple previously placed anchors were noted.  These were removed as we encountered them.  Good stability and good fit at the end of the case.  Bone quality was moderate.  Patient's vault was intact however the central drill seemed to have an atypical path and we had to re-drill after the vault was exposed we had good bicortical purchase with this.  Plans: 25 standard baseplate, 36 standard glenosphere, 4B stem flex and high offset tray with 6 poly-  Post-operative plan: The patient will be NWB in sling.  The patient will be admitted overnight.  DVT prophylaxis not indicated in isolated upper extremity surgery patient with no specific risks factors.  Pain control with PRN pain medication preferring oral medicines.  Follow up plan will be scheduled in approximately 7 days for incision check and XR.  Physical therapy to start after first visit.  Post-Op Diagnosis: Same Surgeons:Primary: Hiram Gash, MD Assistants:Brandon Lynnell Jude Location: [facility] ROOM 06 Anesthesia: Choice Antibiotics: Ancef 2g preop, Vancomycin 1016m locally Tourniquet time: None Estimated Blood Loss: 1143Complications: None Specimens: None Implants: Implant Name Type Inv. Item Serial No. Manufacturer Lot No. LRB No. Used Action  BASEPLATE GLENOSPHERE 288ILSTD - [id number] BASEPLATE GLENOSPHERE 201VISTD  [employer] [id number] 1 Implanted  BONE SCREW THREAD 6.5X35MM - [id number] BONE SCREW THREAD 6.5X35MM  [employer]  Right  1 Implanted  SCREW 5.5X26 - [id number] SCREW 5.5X26  [employer]  Right 1 Implanted  SCREW PERIPHERAL 42 - [id number] SCREW PERIPHERAL 42  [employer]  Right 1 Implanted  GLENOSPHERE REV SHOULDER 36 - [id number] GLENOSPHERE REV SHOULDER 36  [employer] [id number] 1 Implanted  IMPLANT REVERSE SHOULDER 0X3.5 - [id number] IMPLANT REVERSE SHOULDER 0X3.5  [employer] [id number] 1 Implanted  STEM HUMERAL SZ4B STD 78 PTC - [id number] STEM HUMERAL SZ4B STD 78 PTC  [employer] [id number] 1 Implanted  INSERT HUMERAL 36X6MM 12.5DEG - [id number] INSERT HUMERAL 36X6MM 12.5Tora PerchesIGrays Harbor Community [id number] 1 Implanted    Indications for Surgery:   DLeili Eskenaziis a 63y.o. female with continued right shoulder pain after attempts at cuff repair that were unsuccessful previously.  Only.  Since bone quality was so poor that anchors continued to pull out and the patient had a incomplete cuff repair years ago.  We do not feel that based on this that revision cuff repair or fixation of an SER would be appropriate that the patient had minimal arthritis.  Benefits and risks of operative and nonoperative management were discussed prior to surgery with patient/guardian(s) and informed consent form was completed.  Infection and need for further surgery were discussed as was prosthetic stability and cuff issues.  We additionally specifically discussed risks of axillary nerve injury, infection, periprosthetic fracture, continued pain and longevity of implants prior to beginning procedure.      Procedure:   The patient was identified in the preoperative holding area where the surgical site was marked. Block placed by anesthesia with exparel.  The patient was taken to the OR where a procedural timeout was called and the above noted anesthesia was induced.  The patient was positioned beachchair on allen table with spider arm positioner.  Preoperative antibiotics were  dosed.  The patient's right shoulder was prepped and draped in the usual sterile fashion.  A second preoperative timeout was called.       Standard deltopectoral approach was performed with a #10 blade. We dissected down to the subcutaneous tissues and the cephalic vein was taken laterally with the deltoid. Clavipectoral fascia was incised in line with the incision. Deep retractors were placed. The long of the biceps tendon was identified and there was significant tenosynovitis present.  Tenodesis was performed to the pectoralis tendon with #2 Ethibond. The remaining biceps was followed up into the rotator interval where it was released.   The subscapularis was taken down in a full thickness layer with capsule along the humeral neck extending inferiorly around the humeral head. We continued releasing the capsule directly off of the osteophytes inferiorly all the way around the corner. This allowed [place] to dislocate the humeral head.   There is moderate osteoarthritic wear however there is no flattening of the humeral head as this was a cuff issue.  The rotator cuff was carefully examined and noted to be irreperably torn.  The decision was confirmed that a reverse total shoulder was indicated for this patient.  There were osteophytes along the inferior humeral neck. The osteophytes were removed with an osteotome and a rongeur.  Osteophytes were removed with a rongeur and an osteotome and the anatomic neck was well visualized.     A humeral cutting guide was inserted down the intramedullary canal. The version was set at 20 of retroversion. Humeral osteotomy was performed with an oscillating saw. The head fragment was passed off the back table. A starter awl was used to open the humeral canal. We next used T-handle straight sound reamers to ream up to an appropriate fit. A chisel was used to remove proximal humeral bone. We then broached starting with a size one broach and broaching up to 4 which obtained  an appropriate fit. The broach handle was removed. A cut protector was placed. The broach handle was removed and a cut protector was placed. The humerus was retracted posteriorly and we turned our attention to glenoid exposure.  The subscapularis was again identified and immediately we took care to palpate the axillary nerve anteriorly and verify its position with gentle palpation as well as the tug test.  We then released the SGHL with bovie cautery prior to placing a curved mayo at the junction of the anterior glenoid well above the axillary nerve and bluntly dissecting the subscapularis from the capsule.  We then carefully protected the axillary nerve as we gently released the inferior capsule to fully mobilize the subscapularis.  An anterior deltoid retractor was then placed as well as a small Hohmann retractor superiorly.   The glenoid was inspected and had mild wear overall.  The glenoid drill guide was placed and used to drill a guide pin in the center, inferior position. The glenoid face was then reamed concentrically over the guide wire. The center hole was drilled over the guidepin in a near anatomic angle of version. Next the  glenoid vault was drilled back to a depth of 35 mm.  We tapped and then placed a 31m size baseplate with 214mlateralization was selected with a 35 mm x 6.5 mm length central  screw.  The base plate was screwed into the glenoid vault obtaining secure fixation. We next placed superior and inferior locking screws for additional fixation.  Next a 36 mm glenosphere was selected and impacted onto the baseplate. The center screw was tightened.  We turned attention back to the humeral side. The cut protector was removed. We trialed with multiple size tray and polyethylene options and selected a 6 which provided good stability and range of motion without excess soft tissue tension. The offset was dialed in to match the normal anatomy. The shoulder was trialed.  There was good ROM in  all planes and the shoulder was stable with no inferior translation.  The real humeral implants were opened after again confirming sizes.  The trial was removed. #5 Fiberwire x4 sutures passed through the humeral neck for subscap repair. The humeral component was press-fit obtaining a secure fit. A +0 high offset tray was selected and impacted onto the stem.  A 36+6 polyethylene liner was impacted onto the stem.  The joint was reduced and thoroughly irrigated with pulsatile lavage. Subscap was repaired back with #5 Fiberwire sutures through bone tunnels. Hemostasis was obtained. The deltopectoral interval was reapproximated with #1 Ethibond. The subcutaneous tissues were closed with 2-0 Vicryl and the skin was closed with running monocryl.    The wounds were cleaned and dried and an Aquacel dressing was placed. The drapes taken down. The arm was placed into sling with abduction pillow. Patient was awakened, extubated, and transferred to the recovery room in stable condition. There were no intraoperative complications. The sponge, needle, and attention counts were  correct at the end of the case.    Joya Gaskins, OPA-C, present and scrubbed throughout the case, critical for completion in a timely fashion, and for retraction, instrumentation, closure.

## 2018-09-06 NOTE — Progress Notes (Signed)
Assisted Dr. Sabra Heck with right, interscalene  block. Side rails up, monitors on throughout procedure. See vital signs in flow sheet. Tolerated Procedure well.

## 2018-09-06 NOTE — Transfer of Care (Signed)
Immediate Anesthesia Transfer of Care Note  Patient: Chelsea Griffith  Procedure(s) Performed: REVERSE SHOULDER ARTHROPLASTY (Right Shoulder)  Patient Location: PACU  Anesthesia Type:General  Level of Consciousness: awake, alert  and oriented  Airway & Oxygen Therapy: Patient Spontanous Breathing and Patient connected to face mask oxygen  Post-op Assessment: Report given to RN and Post -op Vital signs reviewed and stable  Post vital signs: Reviewed and stable  Last Vitals:  Vitals Value Taken Time  BP    Temp    Pulse    Resp    SpO2      Last Pain:  Vitals:   09/06/18 0849  TempSrc: Oral         Complications: No apparent anesthesia complications

## 2018-09-07 ENCOUNTER — Encounter (HOSPITAL_COMMUNITY): Payer: Self-pay | Admitting: Orthopaedic Surgery

## 2018-09-07 MED ORDER — OXYCODONE HCL 5 MG PO TABS: ORAL_TABLET | ORAL | 0 refills | Status: AC

## 2018-09-07 MED ORDER — CELECOXIB 200 MG PO CAPS: 200.0000 mg | ORAL_CAPSULE | Freq: Two times a day (BID) | ORAL | 2 refills | Status: DC

## 2018-09-07 MED ORDER — ONDANSETRON HCL 4 MG PO TABS: 4.0000 mg | ORAL_TABLET | Freq: Three times a day (TID) | ORAL | 1 refills | Status: AC | PRN

## 2018-09-07 MED ORDER — ACETAMINOPHEN 500 MG PO TABS: 1000.0000 mg | ORAL_TABLET | Freq: Three times a day (TID) | ORAL | 0 refills | Status: AC

## 2018-09-07 NOTE — Progress Notes (Signed)
The pt was provided with d/c instructions. After discussing the pt's plan of care upon d/c home, the pt reported no further questions or concerns.

## 2018-09-07 NOTE — Evaluation (Signed)
Occupational Therapy Evaluation Patient Details Name: Chelsea Griffith MRN: [id number] DOB: 08/13/1955 Today's Date: 09/07/2018    History of Present Illness Right reverse total Shoulder Arthroplasty   Clinical Impression   OT eval and education complete. Handout provided    Follow Up Recommendations  Follow surgeon's recommendation for DC plan and follow-up therapies    Equipment Recommendations  None recommended by OT    Recommendations for Other Services       Precautions / Restrictions Precautions Precautions: Shoulder Type of Shoulder Precautions: No shoulder movement allowed.  Elbow, wrist and hand only Shoulder Interventions: Shoulder abduction pillow;Shoulder sling/immobilizer Required Braces or Orthoses: Sling Restrictions Weight Bearing Restrictions: Yes RUE Weight Bearing: Non weight bearing      Mobility Bed Mobility Overal bed mobility: Independent                Transfers Overall transfer level: Independent                          Pertinent Vitals/Pain Pain Assessment: No/denies pain     Hand Dominance  right   Extremity/Trunk Assessment Upper Extremity Assessment Upper Extremity Assessment: RUE deficits/detail           Communication     Cognition Arousal/Alertness: Awake/alert Behavior During Therapy: WFL for tasks assessed/performed Overall Cognitive Status: Within Functional Limits for tasks assessed                                           Shoulder Instructions Shoulder Instructions Donning/doffing shirt without moving shoulder: Minimal assistance;Patient able to independently direct caregiver Method for sponge bathing under operated UE: Minimal assistance;Patient able to independently direct caregiver Donning/doffing sling/immobilizer: Minimal assistance;Patient able to independently direct caregiver Correct positioning of sling/immobilizer: Minimal assistance;Patient able to independently  direct caregiver ROM for elbow, wrist and digits of operated UE: Minimal assistance;Patient able to independently direct caregiver Sling wearing schedule (on at all times/off for ADL's): Minimal assistance;Patient able to independently direct caregiver Proper positioning of operated UE when showering: Minimal assistance;Patient able to independently direct caregiver Positioning of UE while sleeping: Minimal assistance;Patient able to independently direct caregiver    Home Living Family/patient expects to be discharged to:: Private residence Living Arrangements: Spouse/significant other                                      Prior Functioning/Environment Level of Independence: Independent                 OT Problem List:        OT Treatment/Interventions:      OT Goals(Current goals can be found in the care plan section) Acute Rehab OT Goals Patient Stated Goal: home this day OT Goal Formulation: With patient Time For Goal Achievement: 09/07/18  OT Frequency:      AM-PAC OT "6 Clicks" Daily Activity     Outcome Measure Help from another person eating meals?: A Little Help from another person taking care of personal grooming?: A Little Help from another person toileting, which includes using toliet, bedpan, or urinal?: A Little Help from another person bathing (including washing, rinsing, drying)?: A Little Help from another person to put on and taking off regular upper body clothing?: A Little Help from another person to put  on and taking off regular lower body clothing?: A Little 6 Click Score: 18   End of Session Equipment Utilized During Treatment: Other (comment)(sling) Nurse Communication: Precautions  Activity Tolerance: Patient tolerated treatment well Patient left: in chair;with call bell/phone within reach                   Time: 0982-8675 OT Time Calculation (min): 31 min Charges:  OT General Charges $OT Visit: 1 Visit OT Evaluation $OT  Eval Low Complexity: 1 Low OT Treatments $Self Care/Home Management : 8-22 mins  Chelsea Griffith, [name] [phone] Office- [phone]     Chelsea Griffith, [name] D 09/07/2018, 11:10 AM

## 2018-09-07 NOTE — Anesthesia Postprocedure Evaluation (Signed)
Anesthesia Post Note  Patient: Chelsea Griffith  Procedure(s) Performed: REVERSE SHOULDER ARTHROPLASTY (Right Shoulder)     Patient location during evaluation: PACU Anesthesia Type: General Level of consciousness: awake and alert Pain management: pain level controlled Vital Signs Assessment: post-procedure vital signs reviewed and stable Respiratory status: spontaneous breathing, nonlabored ventilation and respiratory function stable Cardiovascular status: blood pressure returned to baseline and stable Postop Assessment: no apparent nausea or vomiting Anesthetic complications: no    Last Vitals:  Vitals:   09/07/18 0131 09/07/18 0505  BP: (!) 103/57 (!) 99/55  Pulse: 69 75  Resp: 16 20  Temp: 36.6 C 36.5 C  SpO2: 100% 100%    Last Pain:  Vitals:   09/07/18 0505  TempSrc: Oral  PainSc:                  Lynda Rainwater

## 2018-09-14 NOTE — Discharge Summary (Signed)
Patient ID: Chelsea Griffith MRN: [id number] DOB/AGE: 03/17/1955 63 y.o.  Admit date: 09/06/2018 Discharge date: 09/14/2018  Admission Diagnoses:R shoulder cuff tear arthropathy  Discharge Diagnoses:  Active Problems:   Rotator cuff tear arthropathy, right   Past Medical History:  Diagnosis Date  . Anxiety   . Arthritis    fingers and thumbs  . Asthma   . Depression   . GERD (gastroesophageal reflux disease)   . History of anemia   . Hyperlipidemia   . Osteoporosis    right hip  . Rotator cuff tear, right      Procedures Performed: R RTSA  Discharged Condition: good  Hospital Course: Patient brought in as an outpatient for surgery.  Tolerated procedure well.  Was kept for monitoring overnight for pain control and medical monitoring postop and was found to be stable for DC home the morning after surgery.  Patient was instructed on specific activity restrictions and all questions were answered.   Consults: None  Significant Diagnostic Studies: No additional pertinent studies  Treatments: Surgery  Discharge Exam:  Dressing CDI and sling well fitting,  full and painless ROM throughout hand with DPC of 0.  Axillary nerve sensation/motor altered in setting of block and unable to be fully tested.  Distal motor and sensory altered in setting of block.   Disposition:   Discharge Instructions    Call MD for:  persistant nausea and vomiting   Complete by: As directed    Call MD for:  redness, tenderness, or signs of infection (pain, swelling, redness, odor or green/yellow discharge around incision site)   Complete by: As directed    Call MD for:  severe uncontrolled pain   Complete by: As directed    Diet - low sodium heart healthy   Complete by: As directed    Discharge instructions   Complete by: As directed    Ophelia Charter MD, MPH Chelsea Griffith. [address] [phone] (tel)   [phone] (fax)   Chunky may leave the operative dressing in place until your follow-up appointment. KEEP THE INCISIONS CLEAN AND DRY. Use the provided ice machine or Ice packs as often as possible for the first 3-4 days, then as needed for pain relief.  Keep a layer of cloth or a shirt between your skin and the cooling unit to prevent frost bite as it can get very cold. You may shower on Post-Op Day #2. The dressing is water resistant but do not scrub it as it may start to peel up.  You may remove the sling for showering, but keep a water resistant pillow under the arm to keep both the elbow and shoulder away from the body (mimicking the abduction sling). Gently pat the area dry. Do not soak the shoulder in water. Do not go swimming in the pool or ocean until your sutures are removed.  EXERCISES Wear the sling at all times except when doing your exercises. You may remove the sling for showering, but keep the arm across the chest or in a secondary sling.   Accidental/Purposeful External Rotation and shoulder flexion (reaching behind you) is to be avoided at all costs for the first month. Please perform the exercises:   Elbow / Hand / Wrist  Range of Motion Exercises  FOLLOW-UP If you develop a Fever (>101.5), Redness or Drainage from the surgical incision site, please call our office to arrange for  an evaluation. Please call the office to schedule a follow-up appointment for a wound check, 7-10 days post-operatively.    IF YOU HAVE ANY QUESTIONS, PLEASE FEEL FREE TO CALL OUR OFFICE.   HELPFUL INFORMATION  Your arm will be in a sling following surgery. You will be in this sling for the next 3-4 weeks.  I will let you know the exact duration at your follow-up visit.  You may be more comfortable sleeping in a semi-seated position the first few nights following surgery.  Keep a pillow propped under the elbow and forearm for comfort.  If you have a recliner  type of chair it might be beneficial.  If not that is fine too, but it would be helpful to sleep propped up with pillows behind your operated shoulder as well under your elbow and forearm.  This will reduce pulling on the suture lines.  We suggest you use the pain medication the first night prior to going to bed, in order to ease any pain when the anesthesia wears off. You should avoid taking pain medications on an empty stomach as it will make you nauseous.  Do not drink alcoholic beverages or take illicit drugs when taking pain medications.  In most states it is against the law to drive while your arm is in a sling. And certainly against the law to drive while taking narcotics.  You may return to work/school in the next couple of days when you feel up to it. Desk work and typing in the sling is fine.  When dressing, put your operative arm in the sleeve first.  When getting undressed, take your operative arm out last.  Loose fitting, button-down shirts are recommended.  Pain medication may make you constipated.  Below are a few solutions to try in this order: Decrease the amount of pain medication if you aren't having pain. Drink lots of decaffeinated fluids. Drink prune juice and/or each dried prunes  If the first 3 don't work start with additional solutions Take Colace - an over-the-counter stool softener Take Senokot - an over-the-counter laxative Take Miralax - a stronger over-the-counter laxative   Increase activity slowly   Complete by: As directed      Allergies as of 09/07/2018      Reactions   Codeine Itching   Milk-related Compounds Nausea And Vomiting   Just milk, as an infant dropped significant amount of weight    Other    Smells may cause asthma atack      Medication List    STOP taking these medications   amoxicillin 875 MG tablet Commonly known as: AMOXIL   naproxen sodium 220 MG tablet Commonly known as: ALEVE     TAKE these medications   acetaminophen 500  MG tablet Commonly known as: TYLENOL Take 2 tablets (1,000 mg total) by mouth every 8 (eight) hours for 14 days.   Albuterol Sulfate 108 (90 Base) MCG/ACT Aepb Inhale 2 puffs into the lungs every 4 (four) hours as needed (wheeze, SOB, coughing fits).   alendronate 70 MG tablet Commonly known as: FOSAMAX Take 1 tablet (70 mg total) by mouth once a week. Take with a full glass of water on an empty stomach.   beclomethasone 80 MCG/ACT inhaler Commonly known as: Qvar INHALE 2 PUFFS INTO THE LUNGS 2 (TWO) TIMES DAILY. What changed:   how much to take  how to take this  when to take this  reasons to take this  additional instructions   budesonide-formoterol 160-4.5  MCG/ACT inhaler Commonly known as: SYMBICORT Inhale 2 puffs into the lungs 2 (two) times daily as needed (shortness of breath).   calcium-vitamin D 500-200 MG-UNIT tablet Commonly known as: OSCAL WITH D Take 1 tablet by mouth daily at 2 PM.   celecoxib 200 MG capsule Commonly known as: CeleBREX Take 1 capsule (200 mg total) by mouth 2 (two) times daily. What changed: when to take this   clonazePAM 1 MG tablet Commonly known as: KLONOPIN Take 1 mg by mouth every evening.   FLUoxetine 10 MG tablet Commonly known as: PROZAC Take 20 mg by mouth daily.   fluticasone 50 MCG/ACT nasal spray Commonly known as: FLONASE Place 2 sprays into both nostrils daily. What changed:   when to take this  reasons to take this   levocetirizine 5 MG tablet Commonly known as: XYZAL TAKE 1 TABLET BY MOUTH EVERY DAY IN THE EVENING What changed:   how much to take  how to take this   montelukast 10 MG tablet Commonly known as: SINGULAIR TAKE 1 TABLET BY MOUTH EVERYDAY AT BEDTIME What changed: See the new instructions.   multivitamin with minerals Tabs tablet Take 2 tablets by mouth daily.   ondansetron 4 MG tablet Commonly known as: Zofran Take 1 tablet (4 mg total) by mouth every 8 (eight) hours as needed for up  to 7 days for nausea or vomiting.   pantoprazole 40 MG tablet Commonly known as: PROTONIX TAKE 1 TABLET BY MOUTH EVERY DAY What changed: when to take this   pravastatin 20 MG tablet Commonly known as: PRAVACHOL TAKE 1 TABLET BY MOUTH EVERY DAY   PreserVision AREDS 2 Caps Take 1 capsule by mouth 2 (two) times a day.   traZODone 100 MG tablet Commonly known as: DESYREL Take 100 mg by mouth at bedtime.     ASK your doctor about these medications   oxyCODONE 5 MG immediate release tablet Commonly known as: Oxy IR/ROXICODONE Take 1-2 pills every 6 hrs as needed for pain, no more than 6 per day Ask about: Should I take this medication?

## 2018-09-29 ENCOUNTER — Other Ambulatory Visit: Payer: Self-pay | Admitting: Family Medicine

## 2018-09-29 DIAGNOSIS — J45901 Unspecified asthma with (acute) exacerbation: Secondary | ICD-10-CM

## 2018-10-04 ENCOUNTER — Other Ambulatory Visit: Payer: Self-pay | Admitting: Family Medicine

## 2018-11-26 ENCOUNTER — Other Ambulatory Visit: Payer: Self-pay | Admitting: Family Medicine

## 2018-11-26 DIAGNOSIS — J45901 Unspecified asthma with (acute) exacerbation: Secondary | ICD-10-CM

## 2018-11-26 DIAGNOSIS — J069 Acute upper respiratory infection, unspecified: Secondary | ICD-10-CM

## 2018-12-05 ENCOUNTER — Encounter: Payer: Self-pay | Admitting: Family Medicine

## 2018-12-05 ENCOUNTER — Other Ambulatory Visit: Payer: Self-pay

## 2018-12-05 ENCOUNTER — Ambulatory Visit (INDEPENDENT_AMBULATORY_CARE_PROVIDER_SITE_OTHER): Admitting: Family Medicine

## 2018-12-05 VITALS — BP 100/74 | HR 74 | Temp 98.4°F | Resp 14 | Ht 63.0 in | Wt 144.0 lb

## 2018-12-05 DIAGNOSIS — R21 Rash and other nonspecific skin eruption: Secondary | ICD-10-CM | POA: Diagnosis not present

## 2018-12-05 DIAGNOSIS — L299 Pruritus, unspecified: Secondary | ICD-10-CM | POA: Diagnosis not present

## 2018-12-05 MED ORDER — GABAPENTIN 300 MG PO CAPS: 300.0000 mg | ORAL_CAPSULE | Freq: Three times a day (TID) | ORAL | 0 refills | Status: DC | PRN

## 2018-12-05 MED ORDER — PERMETHRIN 5 % EX CREA: 1.0000 "application " | TOPICAL_CREAM | Freq: Once | CUTANEOUS | 0 refills | Status: AC

## 2018-12-05 NOTE — Progress Notes (Signed)
Subjective:    Patient ID: Chelsea Griffith, female    DOB: [birth year], 63 y.o.   MRN: [id number]  HPI 1 month history of recurrent pruritic papular rash in a dermatomal pattern on left posterior shoulder.  Comes and goes cyclically over the last month.  Very itchy.  Also burns. Nowhere else on the body.  No other symptoms. No sick contacts.    Past Medical History:  Diagnosis Date  . Anxiety   . Arthritis    fingers and thumbs  . Asthma   . Depression   . GERD (gastroesophageal reflux disease)   . History of anemia   . Hyperlipidemia   . Osteoporosis    right hip  . Rotator cuff tear, right    Past Surgical History:  Procedure Laterality Date  . ABDOMINAL HYSTERECTOMY    . CESAREAN SECTION      2 times  . COLONOSCOPY    . DILATION AND CURETTAGE OF UTERUS     several  . ELBOW SURGERY Bilateral     2times/1 time on left elbow  . KNEE SURGERY     right knee  . PARTIAL HYSTERECTOMY     2 times  . REVERSE SHOULDER ARTHROPLASTY Right 09/06/2018   Procedure: REVERSE SHOULDER ARTHROPLASTY;  Surgeon: Hiram Gash, MD;  Location: [facility];  Service: Orthopedics;  Laterality: Right;  . ROTATOR CUFF REPAIR     right shoulderx2    Current Outpatient Medications on File Prior to Visit  Medication Sig Dispense Refill  . alendronate (FOSAMAX) 70 MG tablet Take 1 tablet (70 mg total) by mouth once a week. Take with a full glass of water on an empty stomach. 30 tablet 2  . budesonide-formoterol (SYMBICORT) 160-4.5 MCG/ACT inhaler Inhale 2 puffs into the lungs 2 (two) times daily as needed (shortness of breath).    . calcium-vitamin D (OSCAL WITH D) 500-200 MG-UNIT per tablet Take 1 tablet by mouth daily at 2 PM.     . celecoxib (CELEBREX) 200 MG capsule Take 1 capsule (200 mg total) by mouth 2 (two) times daily. 60 capsule 2  . clonazePAM (KLONOPIN) 1 MG tablet Take 1 mg by mouth every evening.     Marland Kitchen FLUoxetine (PROZAC) 10 MG tablet Take 20 mg by mouth daily.    . fluticasone  (FLONASE) 50 MCG/ACT nasal spray SPRAY 2 SPRAYS INTO EACH NOSTRIL EVERY DAY 48 mL 2  . levocetirizine (XYZAL) 5 MG tablet TAKE 1 TABLET BY MOUTH EVERY DAY IN THE EVENING (Patient taking differently: Take 5 mg by mouth every evening. ) 90 tablet 3  . montelukast (SINGULAIR) 10 MG tablet TAKE 1 TABLET BY MOUTH EVERYDAY AT BEDTIME 90 tablet 1  . Multiple Vitamin (MULTIVITAMIN WITH MINERALS) TABS tablet Take 2 tablets by mouth daily.    . Multiple Vitamins-Minerals (PRESERVISION AREDS 2) CAPS Take 1 capsule by mouth 2 (two) times a day.    . pantoprazole (PROTONIX) 40 MG tablet TAKE 1 TABLET BY MOUTH EVERY DAY (Patient taking differently: Take 40 mg by mouth at bedtime. ) 90 tablet 3  . pravastatin (PRAVACHOL) 20 MG tablet TAKE 1 TABLET BY MOUTH EVERY DAY 90 tablet 1  . PROAIR RESPICLICK 123XX123 (90 Base) MCG/ACT AEPB INHALE 2 PUFFS INTO THE LUNGS EVERY 4 (FOUR) HOURS AS NEEDED 1 each 1  . traZODone (DESYREL) 100 MG tablet Take 100 mg by mouth at bedtime.      No current facility-administered medications on file prior to visit.  Allergies  Allergen Reactions  . Codeine Itching  . Milk-Related Compounds Nausea And Vomiting    Just milk, as an infant dropped significant amount of weight   . Other     Smells may cause asthma atack   Social History   Socioeconomic History  . Marital status: Widowed    Spouse name: Not on file  . Number of children: Not on file  . Years of education: Not on file  . Highest education level: Not on file  Occupational History  . Not on file  Social Needs  . Financial resource strain: Not on file  . Food insecurity    Worry: Not on file    Inability: Not on file  . Transportation needs    Medical: Not on file    Non-medical: Not on file  Tobacco Use  . Smoking status: Never Smoker  . Smokeless tobacco: Never Used  Substance and Sexual Activity  . Alcohol use: Yes    Alcohol/week: 1.0 standard drinks    Types: 1 Shots of liquor per week    Comment:  occasional weekly drink  . Drug use: No  . Sexual activity: Yes    Comment: single  Lifestyle  . Physical activity    Days per week: Not on file    Minutes per session: Not on file  . Stress: Not on file  Relationships  . Social Herbalist on phone: Not on file    Gets together: Not on file    Attends religious service: Not on file    Active member of club or organization: Not on file    Attends meetings of clubs or organizations: Not on file    Relationship status: Not on file  . Intimate partner violence    Fear of current or ex partner: Not on file    Emotionally abused: Not on file    Physically abused: Not on file    Forced sexual activity: Not on file  Other Topics Concern  . Not on file  Social History Narrative  . Not on file      Review of Systems  All other systems reviewed and are negative.      Objective:   Physical Exam  Constitutional: She appears well-developed and well-nourished. No distress.  HENT:  Right Ear: External ear normal.  Left Ear: External ear normal.  Mouth/Throat: Oropharynx is clear and moist. No oropharyngeal exudate.  Eyes: Conjunctivae are normal.  Neck: Neck supple.  Cardiovascular: Normal rate, regular rhythm and normal heart sounds.  No murmur heard. Pulmonary/Chest: Effort normal. She has no decreased breath sounds. She has no wheezes. She has no rhonchi. She has no rales. She exhibits no tenderness.  Lymphadenopathy:    She has no cervical adenopathy.  Skin: Rash noted. Rash is papular. She is not diaphoretic.     Vitals reviewed.         Assessment & Plan:  Severe itching - Plan: permethrin (ELIMITE) 5 % cream, gabapentin (NEURONTIN) 300 MG capsule  Rash - Plan: Herpes simplex virus culture  Could be neuropathic itching due to thoracic radiculopathy. Try gabapentin 300 mg potid prn  Could be scabies.  Try elimite cream x 1  Could be a herpetiform eruption.  HSV culture sent.   Reassess for  improvement in 1 week to help differentiate.

## 2018-12-07 LAB — HERPES SIMPLEX VIRUS CULTURE
MICRO NUMBER:: 934090
SPECIMEN QUALITY:: ADEQUATE

## 2018-12-15 ENCOUNTER — Observation Stay (HOSPITAL_COMMUNITY)
Admission: EM | Admit: 2018-12-15 | Discharge: 2018-12-16 | Disposition: A | Discharge status: Home / Self Care | Attending: Internal Medicine | Admitting: Internal Medicine

## 2018-12-15 ENCOUNTER — Emergency Department (HOSPITAL_COMMUNITY)

## 2018-12-15 ENCOUNTER — Encounter (HOSPITAL_COMMUNITY): Payer: Self-pay | Admitting: Emergency Medicine

## 2018-12-15 ENCOUNTER — Other Ambulatory Visit: Payer: Self-pay

## 2018-12-15 DIAGNOSIS — F419 Anxiety disorder, unspecified: Secondary | ICD-10-CM | POA: Diagnosis not present

## 2018-12-15 DIAGNOSIS — M818 Other osteoporosis without current pathological fracture: Secondary | ICD-10-CM | POA: Diagnosis not present

## 2018-12-15 DIAGNOSIS — J45909 Unspecified asthma, uncomplicated: Secondary | ICD-10-CM | POA: Insufficient documentation

## 2018-12-15 DIAGNOSIS — Z79899 Other long term (current) drug therapy: Secondary | ICD-10-CM | POA: Insufficient documentation

## 2018-12-15 DIAGNOSIS — Z20828 Contact with and (suspected) exposure to other viral communicable diseases: Secondary | ICD-10-CM | POA: Diagnosis not present

## 2018-12-15 DIAGNOSIS — Z7951 Long term (current) use of inhaled steroids: Secondary | ICD-10-CM | POA: Insufficient documentation

## 2018-12-15 DIAGNOSIS — R2681 Unsteadiness on feet: Secondary | ICD-10-CM | POA: Diagnosis not present

## 2018-12-15 DIAGNOSIS — R262 Difficulty in walking, not elsewhere classified: Secondary | ICD-10-CM

## 2018-12-15 DIAGNOSIS — F329 Major depressive disorder, single episode, unspecified: Secondary | ICD-10-CM | POA: Diagnosis not present

## 2018-12-15 DIAGNOSIS — E785 Hyperlipidemia, unspecified: Secondary | ICD-10-CM | POA: Diagnosis present

## 2018-12-15 DIAGNOSIS — R531 Weakness: Secondary | ICD-10-CM | POA: Diagnosis not present

## 2018-12-15 DIAGNOSIS — R42 Dizziness and giddiness: Secondary | ICD-10-CM | POA: Diagnosis not present

## 2018-12-15 DIAGNOSIS — Z791 Long term (current) use of non-steroidal anti-inflammatories (NSAID): Secondary | ICD-10-CM | POA: Insufficient documentation

## 2018-12-15 DIAGNOSIS — K219 Gastro-esophageal reflux disease without esophagitis: Secondary | ICD-10-CM | POA: Diagnosis present

## 2018-12-15 DIAGNOSIS — M1990 Unspecified osteoarthritis, unspecified site: Secondary | ICD-10-CM | POA: Diagnosis not present

## 2018-12-15 DIAGNOSIS — F32A Depression, unspecified: Secondary | ICD-10-CM

## 2018-12-15 LAB — CBC WITH DIFFERENTIAL/PLATELET
Abs Immature Granulocytes: 0.02 10*3/uL (ref 0.00–0.07)
Basophils Absolute: 0 10*3/uL (ref 0.0–0.1)
Basophils Relative: 0 %
Eosinophils Absolute: 0 10*3/uL (ref 0.0–0.5)
Eosinophils Relative: 0 %
HCT: 39.4 % (ref 36.0–46.0)
Hemoglobin: 13.5 g/dL (ref 12.0–15.0)
Immature Granulocytes: 0 %
Lymphocytes Relative: 13 %
Lymphs Abs: 0.9 10*3/uL (ref 0.7–4.0)
MCH: 29.3 pg (ref 26.0–34.0)
MCHC: 34.3 g/dL (ref 30.0–36.0)
MCV: 85.5 fL (ref 80.0–100.0)
Monocytes Absolute: 0.2 10*3/uL (ref 0.1–1.0)
Monocytes Relative: 3 %
Neutro Abs: 6 10*3/uL (ref 1.7–7.7)
Neutrophils Relative %: 84 %
Platelets: 155 10*3/uL (ref 150–400)
RBC: 4.61 MIL/uL (ref 3.87–5.11)
RDW: 12.2 % (ref 11.5–15.5)
WBC: 7.1 10*3/uL (ref 4.0–10.5)
nRBC: 0 % (ref 0.0–0.2)

## 2018-12-15 LAB — BASIC METABOLIC PANEL
Anion gap: 10 (ref 5–15)
BUN: 18 mg/dL (ref 8–23)
CO2: 26 mmol/L (ref 22–32)
Calcium: 9.3 mg/dL (ref 8.9–10.3)
Chloride: 105 mmol/L (ref 98–111)
Creatinine, Ser: 0.69 mg/dL (ref 0.44–1.00)
GFR calc Af Amer: >60 mL/min (ref >60)
GFR calc non Af Amer: >60 mL/min (ref >60)
Glucose, Bld: 126 mg/dL — ABNORMAL HIGH (ref 70–99)
Potassium: 3.9 mmol/L (ref 3.5–5.1)
Sodium: 141 mmol/L (ref 135–145)

## 2018-12-15 LAB — CBG MONITORING, ED: Glucose-Capillary: 114 mg/dL — ABNORMAL HIGH (ref 70–99)

## 2018-12-15 LAB — HIV ANTIBODY (ROUTINE TESTING W REFLEX): HIV Screen 4th Generation wRfx: NONREACTIVE

## 2018-12-15 LAB — PROTIME-INR
INR: 1 (ref 0.8–1.2)
Prothrombin Time: 12.8 seconds (ref 11.4–15.2)

## 2018-12-15 LAB — SARS CORONAVIRUS 2 (TAT 6-24 HRS): SARS Coronavirus 2: NEGATIVE

## 2018-12-15 MED ORDER — CLONAZEPAM 0.5 MG PO TABS
1.0000 mg | ORAL_TABLET | Freq: Every evening | ORAL | Status: DC
  Administered 2018-12-15: 1 mg via ORAL
  Filled 2018-12-15: qty 2

## 2018-12-15 MED ORDER — DIAZEPAM 5 MG/ML IJ SOLN
2.5000 mg | Freq: Once | INTRAMUSCULAR | Status: AC
  Administered 2018-12-15: 2.5 mg via INTRAVENOUS
  Filled 2018-12-15: qty 2

## 2018-12-15 MED ORDER — ENOXAPARIN SODIUM 40 MG/0.4ML ~~LOC~~ SOLN
40.0000 mg | Freq: Every day | SUBCUTANEOUS | Status: DC
  Administered 2018-12-16: 40 mg via SUBCUTANEOUS
  Filled 2018-12-15: qty 0.4

## 2018-12-15 MED ORDER — ONDANSETRON HCL 4 MG/2ML IJ SOLN: 4.0000 mg | Freq: Four times a day (QID) | INTRAMUSCULAR | Status: DC | PRN

## 2018-12-15 MED ORDER — SODIUM CHLORIDE 0.9 % IV SOLN
INTRAVENOUS | Status: DC
  Administered 2018-12-15: 18:00:00 via INTRAVENOUS

## 2018-12-15 MED ORDER — TRAZODONE HCL 100 MG PO TABS
100.0000 mg | ORAL_TABLET | Freq: Every day | ORAL | Status: DC
  Administered 2018-12-16: 100 mg via ORAL
  Filled 2018-12-15: qty 1

## 2018-12-15 MED ORDER — ACETAMINOPHEN 650 MG RE SUPP: 650.0000 mg | Freq: Four times a day (QID) | RECTAL | Status: DC | PRN

## 2018-12-15 MED ORDER — FLUOXETINE HCL 20 MG PO CAPS
20.0000 mg | ORAL_CAPSULE | Freq: Every day | ORAL | Status: DC
  Administered 2018-12-16: 20 mg via ORAL
  Filled 2018-12-15: qty 1

## 2018-12-15 MED ORDER — DIAZEPAM 5 MG/ML IJ SOLN: 2.5000 mg | Freq: Four times a day (QID) | INTRAMUSCULAR | Status: DC | PRN

## 2018-12-15 MED ORDER — PRAVASTATIN SODIUM 10 MG PO TABS
20.0000 mg | ORAL_TABLET | Freq: Every day | ORAL | Status: DC
  Administered 2018-12-16: 20 mg via ORAL
  Filled 2018-12-15: qty 2

## 2018-12-15 MED ORDER — ACETAMINOPHEN 325 MG PO TABS: 650.0000 mg | ORAL_TABLET | Freq: Four times a day (QID) | ORAL | Status: DC | PRN

## 2018-12-15 MED ORDER — MECLIZINE HCL 25 MG PO TABS
12.5000 mg | ORAL_TABLET | Freq: Once | ORAL | Status: AC
  Administered 2018-12-15: 15:00:00 12.5 mg via ORAL
  Filled 2018-12-15: qty 1

## 2018-12-15 MED ORDER — PANTOPRAZOLE SODIUM 40 MG PO TBEC
40.0000 mg | DELAYED_RELEASE_TABLET | Freq: Every day | ORAL | Status: DC
  Administered 2018-12-16: 40 mg via ORAL
  Filled 2018-12-15: qty 1

## 2018-12-15 MED ORDER — MECLIZINE HCL 12.5 MG PO TABS
12.5000 mg | ORAL_TABLET | Freq: Three times a day (TID) | ORAL | Status: DC | PRN
  Filled 2018-12-15: qty 1

## 2018-12-15 MED ORDER — ONDANSETRON HCL 4 MG/2ML IJ SOLN
4.0000 mg | Freq: Once | INTRAMUSCULAR | Status: AC
  Administered 2018-12-15: 4 mg via INTRAVENOUS
  Filled 2018-12-15: qty 2

## 2018-12-15 NOTE — ED Notes (Signed)
Diet tray ordered 

## 2018-12-15 NOTE — ED Provider Notes (Signed)
Received patient at signout from [facility].  Refer to provider note for full history and physical examination.  Briefly patient is a 63 year old female with history of hyperlipidemia, GERD, asthma, anxiety, depression presenting for evaluation of acute onset, persistent dizziness with associated nausea and vomiting that began at 5 AM this morning.  Horizontal nystagmus at rest on examination.  Neurology recommended obtaining MRI which was negative for acute CVA.  She was given Valium, Zofran, meclizine.  Plan to reassess.  If symptoms have improved and she is able to ambulate independently then she will likely be stable for discharge home.  If not, she may require admission for intractable dizziness gait disturbance.  Physical Exam  BP 110/70   Pulse (!) 57   Temp (!) 97.5 F (36.4 C) (Oral)   Resp (!) 21   Ht 5\' 3"  (1.6 m)   Wt 65.3 kg   SpO2 95%   BMI 25.51 kg/m   Physical Exam Vitals signs and nursing note reviewed.  Constitutional:      General: She is not in acute distress.    Appearance: She is well-developed.  HENT:     Head: Normocephalic and atraumatic.  Eyes:     General:        Right eye: No discharge.        Left eye: No discharge.     Extraocular Movements: Extraocular movements intact.     Conjunctiva/sclera: Conjunctivae normal.     Pupils: Pupils are equal, round, and reactive to light.     Comments: No nystagmus  Neck:     Vascular: No JVD.     Trachea: No tracheal deviation.  Cardiovascular:     Rate and Rhythm: Normal rate.  Pulmonary:     Effort: Pulmonary effort is normal.  Abdominal:     General: There is no distension.  Skin:    General: Skin is warm and dry.     Findings: No erythema.  Neurological:     Mental Status: She is alert.     Comments: Fluent speech, no facial droop, cranial nerves appear grossly intact.  Moves all extremities spontaneously without difficulty.  When at rest, reports dizziness has improved.  Sitting upright, reports room  spinning sensation significantly worsens.  She is able to pull herself up to a standing position but is unable to ambulate independently and exhibits an unsteady gait.  Psychiatric:        Behavior: Behavior normal.     ED Course/Procedures   Procedures  MDM  On reevaluation patient sleeping, easily arousable.  She reports that her dizziness has improved at rest but still has some room spinning sensation while laying in bed.  Upon sitting upright the sensation worsened significantly.  She is able to pull herself up to a standing position but cannot ambulate independently.  Will require admission for persistent ataxia/dizziness.  Spoke with Dr. Maylene Roes with [facility] hospitalist service who agrees to assume care of patient and bring her into the hospital for further evaluation and management.      Chelsea Papa, PA-C 12/15/18 1655    Chelsea Belling, MD 12/15/18 2328

## 2018-12-15 NOTE — ED Notes (Signed)
Attempted to call report, floor unable to take at this time

## 2018-12-15 NOTE — ED Notes (Signed)
Patient transported to MRI 

## 2018-12-15 NOTE — ED Provider Notes (Signed)
[facility] EMERGENCY DEPARTMENT Provider Note   CSN: [id number] Arrival date & time: 12/15/18  1219     History   Chief Complaint Chief Complaint  Patient presents with   Dizziness    HPI Petria Seamon is a 63 y.o. female with past medical history of hyperlipidemia, GERD, asthma, anxiety, depression, presenting to the emergency department via EMS with sudden onset of room spinning dizziness that began after getting up this morning after 8 AM.  She reports severe room spinning dizziness with associated nausea and vomiting.  Symptoms are worse when she opens her eyes or moves.  She has no associated headache, changes in vision, numbness or weakness in extremities, speech difficulty.  She was given Zofran in route which provided some improvement in nausea. No history of stroke.  Not on anticoagulation.  No recent illness.  She does endorse some seasonal allergies with nasal congestion.  No known history of vertigo.     The history is provided by the patient.    Past Medical History:  Diagnosis Date   Anxiety    Arthritis    fingers and thumbs   Asthma    Depression    GERD (gastroesophageal reflux disease)    History of anemia    Hyperlipidemia    Osteoporosis    right hip   Rotator cuff tear, right     Patient Active Problem List   Diagnosis Date Noted   Rotator cuff tear arthropathy, right 09/06/2018   Osteoporosis 04/08/2014   Elevated lipids 10/19/2013   GERD (gastroesophageal reflux disease) 10/19/2013   Chest pain 10/19/2013    Past Surgical History:  Procedure Laterality Date   ABDOMINAL HYSTERECTOMY     CESAREAN SECTION      2 times   COLONOSCOPY     DILATION AND CURETTAGE OF UTERUS     several   ELBOW SURGERY Bilateral     2times/1 time on left elbow   KNEE SURGERY     right knee   PARTIAL HYSTERECTOMY     2 times   REVERSE SHOULDER ARTHROPLASTY Right 09/06/2018   Procedure: REVERSE SHOULDER  ARTHROPLASTY;  Surgeon: Hiram Gash, MD;  Location: [facility];  Service: Orthopedics;  Laterality: Right;   ROTATOR CUFF REPAIR     right shoulderx2      OB History   No obstetric history on file.      Home Medications    Prior to Admission medications   Medication Sig Start Date End Date Taking? Authorizing Provider  alendronate (FOSAMAX) 70 MG tablet Take 1 tablet (70 mg total) by mouth once a week. Take with a full glass of water on an empty stomach. 09/01/18   Susy Frizzle, MD  budesonide-formoterol [facility]) 160-4.5 MCG/ACT inhaler Inhale 2 puffs into the lungs 2 (two) times daily as needed (shortness of breath).    [provider]  calcium-vitamin D (OSCAL WITH D) 500-200 MG-UNIT per tablet Take 1 tablet by mouth daily at 2 PM.     [provider]  celecoxib (CELEBREX) 200 MG capsule Take 1 capsule (200 mg total) by mouth 2 (two) times daily. 09/07/18 09/07/19  Hiram Gash, MD  clonazePAM (KLONOPIN) 1 MG tablet Take 1 mg by mouth every evening.  04/10/13   [provider]  FLUoxetine (PROZAC) 10 MG tablet Take 20 mg by mouth daily.    [provider]  fluticasone (FLONASE) 50 MCG/ACT nasal spray SPRAY 2 SPRAYS INTO EACH NOSTRIL EVERY  DAY 11/27/18   Susy Frizzle, MD  gabapentin (NEURONTIN) 300 MG capsule Take 1 capsule (300 mg total) by mouth 3 (three) times daily as needed (nerve itching). 12/05/18   Susy Frizzle, MD  levocetirizine (XYZAL) 5 MG tablet TAKE 1 TABLET BY MOUTH EVERY DAY IN THE EVENING Patient taking differently: Take 5 mg by mouth every evening.  07/19/18   Susy Frizzle, MD  montelukast (SINGULAIR) 10 MG tablet TAKE 1 TABLET BY MOUTH EVERYDAY AT BEDTIME 11/27/18   Susy Frizzle, MD  Multiple Vitamin (MULTIVITAMIN WITH MINERALS) TABS tablet Take 2 tablets by mouth daily.    [provider]  Multiple Vitamins-Minerals (PRESERVISION AREDS 2) CAPS Take 1 capsule by mouth 2 (two) times a day.    [provider]  pantoprazole (PROTONIX) 40 MG tablet TAKE 1 TABLET BY MOUTH EVERY DAY Patient taking differently: Take 40 mg by mouth at bedtime.  04/17/18   Susy Frizzle, MD  pravastatin (PRAVACHOL) 20 MG tablet TAKE 1 TABLET BY MOUTH EVERY DAY 10/04/18   Susy Frizzle, MD  PROAIR RESPICLICK [id number] [phone] Base) MCG/ACT AEPB INHALE 2 PUFFS INTO THE LUNGS EVERY 4 (FOUR) HOURS AS NEEDED 10/02/18   Susy Frizzle, MD  traZODone (DESYREL) 100 MG tablet Take 100 mg by mouth at bedtime.  03/02/13   [provider]    Family History Family History  Problem Relation Age of Onset   Hyperlipidemia Mother    ADD / ADHD Son    Heart disease Maternal Grandmother    Colon cancer Neg Hx    Esophageal cancer Neg Hx    Rectal cancer Neg Hx    Stomach cancer Neg Hx     Social History Social History   Tobacco Use   Smoking status: Never Smoker   Smokeless tobacco: Never Used  Substance Use Topics   Alcohol use: Yes    Alcohol/week: 1.0 standard drinks    Types: 1 Shots of liquor per week    Comment: occasional weekly drink   Drug use: No     Allergies   Codeine, Milk-related compounds, and Other   Review of Systems Review of Systems  Constitutional: Negative for fever.  Gastrointestinal: Positive for nausea and vomiting.  Neurological: Positive for dizziness.  All other systems reviewed and are negative.    Physical Exam Updated Vital Signs BP 132/73    Pulse 66    Temp (!) 97.5 F (36.4 C) (Oral)    Resp (!) 22    Ht 5\' 3"  (1.6 m)    Wt 65.3 kg    SpO2 95%    BMI 25.51 kg/m   Physical Exam Vitals signs and nursing note reviewed.  Constitutional:      Appearance: She is well-developed.     Comments: Patient is resting with her eyes closed prefer not to open them  HENT:     Head: Normocephalic and atraumatic.  Eyes:     Conjunctiva/sclera: Conjunctivae normal.  Pulmonary:     Effort: Pulmonary effort is normal.  Abdominal:     Palpations: Abdomen  is soft.  Skin:    General: Skin is warm.  Neurological:     Mental Status: She is alert.     Comments:  Mental Status:  Alert, oriented, thought content appropriate, able to give a coherent history. Speech fluent without evidence of aphasia. Able to follow 2 step commands without difficulty.  Cranial Nerves:  II:  Peripheral visual fields not assessed  d/t pt's dizziness and intolerance holding eyes open, pupils equal, round, reactive to light III,IV, VI: ptosis not present,  resting horizontal nystagmus present which may have some mild rotary movement as well.  V,VII: smile symmetric, facial light touch sensation equal VIII: hearing grossly normal to voice  X: uvula elevates symmetrically  XI: bilateral shoulder shrug symmetric and strong XII: midline tongue extension without fassiculations Motor:  Normal tone. 5/5 strength in upper and lower extremities bilaterally including strong and equal grip strength and dorsiflexion/plantar flexion Sensory: grossly normal in all extremities. No neglect Cerebellar: slight difficulty finding her nose with right hand. Left finger-to-nose is normal Gait:   Gait and balance exam deferred given patient's level of dizziness. CV: distal pulses palpable throughout    Psychiatric:        Behavior: Behavior normal.      ED Treatments / Results  Labs (all labs ordered are listed, but only abnormal results are displayed) Labs Reviewed  CBC WITH DIFFERENTIAL/PLATELET  BASIC METABOLIC PANEL  PROTIME-INR    EKG None  Radiology No results found.  Procedures Procedures (including critical care time)  Medications Ordered in ED Medications - No data to display   Initial Impression / Assessment and Plan / ED Course  I have reviewed the triage vital signs and the nursing notes.  Pertinent labs & imaging results that were available during my care of the patient were reviewed by me and considered in my medical decision making (see chart for  details).  Clinical Course as of Dec 14 1301  Fri Dec 15, 2018  1257 MRI made aware of need for urgent MRI.   [JR]  1259 Informally discussed this patient with neurologist, Dr. Rory Percy, who recommends emergent MRI with concern for central cause of vertigo.   [JR]    Clinical Course User Index [JR] Nikkie Liming, [place] N, PA-C       Patient presenting with sudden onset of room spinning dizziness with nausea and vomiting that began this morning upon awakening and getting up from bed after 8 AM.  Symptoms of been persistent until arrival to the ED.  On exam patient is resting with her eyes closed ago when opening them she has resting horizontal nystagmus significant dizziness.  She was given Zofran by EMS which did find some improvement in her nausea.  She had some very slight difficulty finding her nose with her right hand though otherwise no other focal neuro deficits.  Patient informally discussed with neurology recommends proceed with MRI imaging with concern for possible central cause of vertigo.  Labs and MRI obtained which are overall reassuring, negative MRI.  Patient reevaluated after MRI, nystagmus has slightly improved and her symptoms have slightly improved prior to intervention though she did develop a generalized headache while in MRI.  Symptomatic medications ordered including IV Valium, Zofran, and p.o. meclizine.  Care assumed at shift change by PA [facility], with plan to reevaluate for symptom improvement.  If patient's symptoms improve and she can ambulate, anticipate discharge with symptomatic management and close outpatient follow-up.  Patient discussed with Dr. Johnney Killian, who is agreeable with care plan at this time.  Final Clinical Impressions(s) / ED Diagnoses   Final diagnoses:  None    ED Discharge Orders    None       Rimas Gilham, [place] N, PA-C 12/15/18 1531    Charlesetta Shanks, MD 12/18/18 [phone]

## 2018-12-15 NOTE — ED Triage Notes (Signed)
Pt arrives via EMS from home where she reports jumping out of bed and noticed trouble getting her balance. States the room feels like it is spinning and worsened when eyes open. Endorses nausea. EMS reports negative stroke scale and 4 zofran given.

## 2018-12-15 NOTE — H&P (Signed)
History and Physical    Chelsea Griffith [id number] DOB: [birth year] DOA: 12/15/2018  PCP: Susy Frizzle, MD  Patient coming from: Home  Chief Complaint: Dizziness   HPI: Chelsea Griffith is a 63 y.o. female with medical history significant of hyperlipidemia, osteoporosis, GERD, depression/anxiety who presents with dizziness.  She states that she was in her normal state of health yesterday.  She woke up this morning, got out of bed quickly and started to notice some mild dizziness, but was able to get dressed.  She sat on the couch for a while, and her symptoms started to get worse.  She describes the room spinning around her very quickly, also admitted to some nausea and vomiting associated with this dizziness.  She had to sit with her eyes closed for hours, and for ambulation, she had to grab onto the wall or other furniture to gain stability.  She states that she has had these symptoms in a mild capacity a few times this past year, but has never been this severe, or lasted this long.  She had not needed to seek medical care for those episodes.  She denies any fevers, chest pain or shortness of breath.  No abdominal pain.  She does endorse some mild occipital headache that started this afternoon.  She also admits to generalized weakness.  ED Course: Case discussed by EDP with neurology, recommended MRI to rule out possible central cause of vertigo.  MRI was negative.  Patient was given meclizine, Valium and Zofran with mild improvement.  However, on reevaluation later in the day, she was unable to ambulate and continued to have symptoms.  COVID test for screening pending.  Review of Systems: As per HPI. Otherwise, all other review of systems reviewed and are negative.   Past Medical History:  Diagnosis Date   Anxiety    Arthritis    fingers and thumbs   Asthma    Depression    GERD (gastroesophageal reflux disease)    History of anemia    Hyperlipidemia     Osteoporosis    right hip   Rotator cuff tear, right     Past Surgical History:  Procedure Laterality Date   ABDOMINAL HYSTERECTOMY     CESAREAN SECTION      2 times   COLONOSCOPY     DILATION AND CURETTAGE OF UTERUS     several   ELBOW SURGERY Bilateral     2times/1 time on left elbow   KNEE SURGERY     right knee   PARTIAL HYSTERECTOMY     2 times   REVERSE SHOULDER ARTHROPLASTY Right 09/06/2018   Procedure: REVERSE SHOULDER ARTHROPLASTY;  Surgeon: Hiram Gash, MD;  Location: [facility];  Service: Orthopedics;  Laterality: Right;   ROTATOR CUFF REPAIR     right shoulderx2      reports that she has never smoked. She has never used smokeless tobacco. She reports current alcohol use of about 1.0 standard drinks of alcohol per week. She reports that she does not use drugs.  Allergies  Allergen Reactions   Codeine Itching   Milk-Related Compounds Nausea And Vomiting    Just milk, as an infant dropped significant amount of weight    Other     Smells may cause asthma atack    Family History  Problem Relation Age of Onset   Hyperlipidemia Mother    ADD / ADHD Son    Heart disease Maternal Grandmother    Colon cancer Neg  Hx    Esophageal cancer Neg Hx    Rectal cancer Neg Hx    Stomach cancer Neg Hx     Prior to Admission medications   Medication Sig Start Date End Date Taking? Authorizing Provider  alendronate (FOSAMAX) 70 MG tablet Take 1 tablet (70 mg total) by mouth once a week. Take with a full glass of water on an empty stomach. 09/01/18   Susy Frizzle, MD  budesonide-formoterol [facility]) 160-4.5 MCG/ACT inhaler Inhale 2 puffs into the lungs 2 (two) times daily as needed (shortness of breath).    [provider]  calcium-vitamin D (OSCAL WITH D) 500-200 MG-UNIT per tablet Take 1 tablet by mouth daily at 2 PM.     [provider]  celecoxib (CELEBREX) 200 MG capsule Take 1 capsule (200 mg total) by mouth 2 (two) times daily.  09/07/18 09/07/19  Hiram Gash, MD  clonazePAM (KLONOPIN) 1 MG tablet Take 1 mg by mouth every evening.  04/10/13   [provider]  FLUoxetine (PROZAC) 10 MG tablet Take 20 mg by mouth daily.    [provider]  fluticasone (FLONASE) 50 MCG/ACT nasal spray SPRAY 2 SPRAYS INTO EACH NOSTRIL EVERY DAY 11/27/18   Susy Frizzle, MD  gabapentin (NEURONTIN) 300 MG capsule Take 1 capsule (300 mg total) by mouth 3 (three) times daily as needed (nerve itching). 12/05/18   Susy Frizzle, MD  levocetirizine (XYZAL) 5 MG tablet TAKE 1 TABLET BY MOUTH EVERY DAY IN THE EVENING Patient taking differently: Take 5 mg by mouth every evening.  07/19/18   Susy Frizzle, MD  montelukast (SINGULAIR) 10 MG tablet TAKE 1 TABLET BY MOUTH EVERYDAY AT BEDTIME 11/27/18   Susy Frizzle, MD  Multiple Vitamin (MULTIVITAMIN WITH MINERALS) TABS tablet Take 2 tablets by mouth daily.    [provider]  Multiple Vitamins-Minerals (PRESERVISION AREDS 2) CAPS Take 1 capsule by mouth 2 (two) times a day.    [provider]  pantoprazole (PROTONIX) 40 MG tablet TAKE 1 TABLET BY MOUTH EVERY DAY Patient taking differently: Take 40 mg by mouth at bedtime.  04/17/18   Susy Frizzle, MD  pravastatin (PRAVACHOL) 20 MG tablet TAKE 1 TABLET BY MOUTH EVERY DAY 10/04/18   Susy Frizzle, MD  PROAIR RESPICLICK [id number] [phone] Base) MCG/ACT AEPB INHALE 2 PUFFS INTO THE LUNGS EVERY 4 (FOUR) HOURS AS NEEDED 10/02/18   Susy Frizzle, MD  traZODone (DESYREL) 100 MG tablet Take 100 mg by mouth at bedtime.  03/02/13   [provider]    Physical Exam: Vitals:   12/15/18 1600 12/15/18 1615 12/15/18 1630 12/15/18 1645  BP: 114/70 109/70 110/69 125/71  Pulse: (!) 57 (!) 57 (!) 59 63  Resp:    20  Temp:      TempSrc:      SpO2: 94% 94% 92% 94%  Weight:      Height:          Constitutional: NAD, calm, comfortable Eyes: PERRL, lids and conjunctivae normal ENMT: Deferred, patient wearing a  mask Respiratory: Clear to auscultation bilaterally, no wheezing, no crackles. Normal respiratory effort. No accessory muscle use. No conversational dyspnea  Cardiovascular: Regular rate and rhythm, no murmurs. No extremity edema.  Abdomen: Soft, nondistended, nontender to palpation. Bowel sounds positive.  Musculoskeletal: No joint deformity upper and lower extremities. No contractures. Normal muscle tone.  Skin: no rashes, lesions, ulcers on exposed skin  Neurologic: Alert and oriented, speech fluent, no  focal deficits noted Psychiatric: Normal judgment and insight. Normal mood and affect   Labs on Admission: I have personally reviewed following labs and imaging studies  CBC: Recent Labs  Lab 12/15/18 1253  WBC 7.1  NEUTROABS 6.0  HGB 13.5  HCT 39.4  MCV 85.5  PLT 99991111   Basic Metabolic Panel: Recent Labs  Lab 12/15/18 1253  NA 141  K 3.9  CL 105  CO2 26  GLUCOSE 126*  BUN 18  CREATININE 0.69  CALCIUM 9.3   GFR: Estimated Creatinine Clearance: 64.6 mL/min (by C-G formula based on SCr of 0.69 mg/dL). Liver Function Tests: No results for input(s): AST, ALT, ALKPHOS, BILITOT, PROT, ALBUMIN in the last 168 hours. No results for input(s): LIPASE, AMYLASE in the last 168 hours. No results for input(s): AMMONIA in the last 168 hours. Coagulation Profile: Recent Labs  Lab 12/15/18 1253  INR 1.0   Cardiac Enzymes: No results for input(s): CKTOTAL, CKMB, CKMBINDEX, TROPONINI in the last 168 hours. BNP (last 3 results) No results for input(s): PROBNP in the last 8760 hours. HbA1C: No results for input(s): HGBA1C in the last 72 hours. CBG: Recent Labs  Lab 12/15/18 1219  GLUCAP 114*   Lipid Profile: No results for input(s): CHOL, HDL, LDLCALC, TRIG, CHOLHDL, LDLDIRECT in the last 72 hours. Thyroid Function Tests: No results for input(s): TSH, T4TOTAL, FREET4, T3FREE, THYROIDAB in the last 72 hours. Anemia Panel: No results for input(s): VITAMINB12, FOLATE,  FERRITIN, TIBC, IRON, RETICCTPCT in the last 72 hours. Urine analysis:    Component Value Date/Time   COLORURINE YELLOW 04/10/2015 [place] 04/10/2015 1253   LABSPEC 1.010 04/10/2015 1253   PHURINE 7.0 04/10/2015 1253   GLUCOSEU NEGATIVE 04/10/2015 1253   HGBUR NEGATIVE 04/10/2015 1253   BILIRUBINUR NEGATIVE 04/10/2015 1253   KETONESUR NEGATIVE 04/10/2015 1253   PROTEINUR NEGATIVE 04/10/2015 1253   NITRITE NEGATIVE 04/10/2015 1253   LEUKOCYTESUR TRACE (A) 04/10/2015 1253   Sepsis Labs: !!!!!!!!!!!!!!!!!!!!!!!!!!!!!!!!!!!!!!!!!!!! @LABRCNTIP (procalcitonin:4,lacticidven:4) )No results found for this or any previous visit (from the past 240 hour(s)).   Radiological Exams on Admission: Mr Brain Wo Contrast (neuro Protocol)  Result Date: 12/15/2018 CLINICAL DATA:  Vertigo, persistent, central. Additional history provided: Patient reports trouble with balance, patient reports room feels like it is spinning, worse with eyes open, nausea. EXAM: MRI HEAD WITHOUT CONTRAST TECHNIQUE: Multiplanar, multiecho pulse sequences of the brain and surrounding structures were obtained without intravenous contrast. COMPARISON:  MRA head 04/25/2015, head CT 04/17/2015 FINDINGS: Brain: There is no convincing evidence of acute infarct. No evidence of intracranial mass. No midline shift or extra-axial fluid collection. No chronic intracranial blood products. No focal parenchymal signal abnormality Cerebral volume is normal for age. Vascular: Flow voids maintained within the proximal large arterial vessels. Skull and upper cervical spine: No focal marrow lesion Sinuses/Orbits: Visualized orbits demonstrate no acute abnormality. Trace ethmoid sinus mucosal thickening. No significant mastoid effusion. IMPRESSION: Normal MRI appearance of the brain for age. No evidence of acute intracranial abnormality. Electronically Signed   By: Kellie Simmering   On: 12/15/2018 14:54    EKG: Independently reviewed.   Normal sinus rhythm.  Assessment/Plan Principal Problem:   Weakness with dizziness Active Problems:   Elevated lipids   GERD (gastroesophageal reflux disease)   Depression   Anxiety   Dizziness   Intractable dizziness -?Secondary to vertigo/BPPV -MRI brain without acute intracranial abnormality -Continue Valium, meclizine, antiemetic as needed -PT evaluation for BPPV  -Start IV fluid, she has not really  had much to eat today  Depression/anxiety -Continue Prozac, Klonopin nightly  GERD -Continue Protonix   Hyperlipidemia -Continue Pravachol   DVT prophylaxis: Lovenox Code Status: Full code Family Communication: None Disposition Plan: Pending improvement in clinical symptoms, suspect she may return home next 24 hours Consults called: Neuro by phone only  Admission status: Observation   Severity of Illness: The appropriate patient status for this patient is OBSERVATION. Observation status is judged to be reasonable and necessary in order to provide the required intensity of service to ensure the patient's safety. The patient's presenting symptoms, physical exam findings, and initial radiographic and laboratory data in the context of their medical condition is felt to place them at decreased risk for further clinical deterioration. Furthermore, it is anticipated that the patient will be medically stable for discharge from the hospital within 2 midnights of admission. The following factors support the patient status of observation.    Dessa Phi, DO [facility] 12/15/2018, 5:40 PM   Available via Epic secure chat 7am-7pm After these hours, please refer to coverage provider listed on [email or web address]

## 2018-12-16 ENCOUNTER — Encounter (HOSPITAL_COMMUNITY): Payer: Self-pay | Admitting: *Deleted

## 2018-12-16 DIAGNOSIS — R42 Dizziness and giddiness: Secondary | ICD-10-CM | POA: Diagnosis not present

## 2018-12-16 DIAGNOSIS — R531 Weakness: Secondary | ICD-10-CM | POA: Diagnosis not present

## 2018-12-16 MED ORDER — MECLIZINE HCL 25 MG PO TABS: 25.0000 mg | ORAL_TABLET | Freq: Three times a day (TID) | ORAL | 0 refills | Status: DC | PRN

## 2018-12-16 MED ORDER — DIAZEPAM 5 MG PO TABS: 5.0000 mg | ORAL_TABLET | Freq: Every day | ORAL | 0 refills | Status: DC | PRN

## 2018-12-16 NOTE — Discharge Summary (Signed)
Physician Discharge Summary  Chelsea Griffith [id number] DOB: 07/11/1955 DOA: 12/15/2018  PCP: Susy Frizzle, MD  Admit date: 12/15/2018 Discharge date: 12/16/2018  Admitted From: home Discharge disposition: home   Recommendations for Outpatient Follow-Up:   1. Have d/c'd Neurontin 2. Outpatient vestibular PT   Discharge Diagnosis:   Principal Problem:   Weakness with dizziness Active Problems:   Elevated lipids   GERD (gastroesophageal reflux disease)   Depression   Anxiety   Dizziness    Discharge Condition: Improved.  Diet recommendation:  Regular.  Wound care: None.  Code status: Full.   History of Present Illness:   Chelsea Griffith is a 63 y.o. female with medical history significant of hyperlipidemia, osteoporosis, GERD, depression/anxiety who presents with dizziness.  She states that she was in her normal state of health yesterday.  She woke up this morning, got out of bed quickly and started to notice some mild dizziness, but was able to get dressed.  She sat on the couch for a while, and her symptoms started to get worse.  She describes the room spinning around her very quickly, also admitted to some nausea and vomiting associated with this dizziness.  She had to sit with her eyes closed for hours, and for ambulation, she had to grab onto the wall or other furniture to gain stability.  She states that she has had these symptoms in a mild capacity a few times this past year, but has never been this severe, or lasted this long.  She had not needed to seek medical care for those episodes.  She denies any fevers, chest pain or shortness of breath.  No abdominal pain.  She does endorse some mild occipital headache that started this afternoon.  She also admits to generalized weakness.   Hospital Course by Problem:   Intractable dizziness -Secondary to vertigo/BPPV -MRI brain without acute intracranial abnormality -Continue Valium, meclizine  -PT evaluation for BPPV-- outpatient follow up   Depression/anxiety -Continue Prozac, Klonopin nightly  GERD -Continue Protonix   Hyperlipidemia -Continue Pravachol  Poor PO intake -encouraged a regular diet -east maybe 1 meal/day   Medical Consultants:      Discharge Exam:   Vitals:   12/16/18 0409 12/16/18 0810  BP: (!) 99/59 114/63  Pulse: 62 69  Resp: 18 16  Temp: 98 F (36.7 C) 97.8 F (36.6 C)  SpO2: 95% 94%   Vitals:   12/16/18 0002 12/16/18 0018 12/16/18 0409 12/16/18 0810  BP:  110/70 (!) 99/59 114/63  Pulse:  63 62 69  Resp:  17 18 16   Temp: 98.2 F (36.8 C) 98 F (36.7 C) 98 F (36.7 C) 97.8 F (36.6 C)  TempSrc: Oral Oral Oral Oral  SpO2:  93% 95% 94%  Weight:      Height:        General exam: Appears calm and comfortable.   The results of significant diagnostics from this hospitalization (including imaging, microbiology, ancillary and laboratory) are listed below for reference.     Procedures and Diagnostic Studies:   Mr Brain Wo Contrast (neuro Protocol)  Result Date: 12/15/2018 CLINICAL DATA:  Vertigo, persistent, central. Additional history provided: Patient reports trouble with balance, patient reports room feels like it is spinning, worse with eyes open, nausea. EXAM: MRI HEAD WITHOUT CONTRAST TECHNIQUE: Multiplanar, multiecho pulse sequences of the brain and surrounding structures were obtained without intravenous contrast. COMPARISON:  MRA head 04/25/2015, head CT 04/17/2015 FINDINGS: Brain: There is no  convincing evidence of acute infarct. No evidence of intracranial mass. No midline shift or extra-axial fluid collection. No chronic intracranial blood products. No focal parenchymal signal abnormality Cerebral volume is normal for age. Vascular: Flow voids maintained within the proximal large arterial vessels. Skull and upper cervical spine: No focal marrow lesion Sinuses/Orbits: Visualized orbits demonstrate no acute abnormality.  Trace ethmoid sinus mucosal thickening. No significant mastoid effusion. IMPRESSION: Normal MRI appearance of the brain for age. No evidence of acute intracranial abnormality. Electronically Signed   By: Kellie Simmering   On: 12/15/2018 14:54     Labs:   Basic Metabolic Panel: Recent Labs  Lab 12/15/18 1253  NA 141  K 3.9  CL 105  CO2 26  GLUCOSE 126*  BUN 18  CREATININE 0.69  CALCIUM 9.3   GFR Estimated Creatinine Clearance: 64.6 mL/min (by C-G formula based on SCr of 0.69 mg/dL). Liver Function Tests: No results for input(s): AST, ALT, ALKPHOS, BILITOT, PROT, ALBUMIN in the last 168 hours. No results for input(s): LIPASE, AMYLASE in the last 168 hours. No results for input(s): AMMONIA in the last 168 hours. Coagulation profile Recent Labs  Lab 12/15/18 1253  INR 1.0    CBC: Recent Labs  Lab 12/15/18 1253  WBC 7.1  NEUTROABS 6.0  HGB 13.5  HCT 39.4  MCV 85.5  PLT 155   Cardiac Enzymes: No results for input(s): CKTOTAL, CKMB, CKMBINDEX, TROPONINI in the last 168 hours. BNP: Invalid input(s): POCBNP CBG: Recent Labs  Lab 12/15/18 1219  GLUCAP 114*   D-Dimer No results for input(s): DDIMER in the last 72 hours. Hgb A1c No results for input(s): HGBA1C in the last 72 hours. Lipid Profile No results for input(s): CHOL, HDL, LDLCALC, TRIG, CHOLHDL, LDLDIRECT in the last 72 hours. Thyroid function studies No results for input(s): TSH, T4TOTAL, T3FREE, THYROIDAB in the last 72 hours.  Invalid input(s): FREET3 Anemia work up No results for input(s): VITAMINB12, FOLATE, FERRITIN, TIBC, IRON, RETICCTPCT in the last 72 hours. Microbiology Recent Results (from the past 240 hour(s))  SARS CORONAVIRUS 2 (TAT 6-24 HRS) Nasopharyngeal Nasopharyngeal Swab     Status: None   Collection Time: 12/15/18  4:45 PM   Specimen: Nasopharyngeal Swab  Result Value Ref Range Status   SARS Coronavirus 2 NEGATIVE NEGATIVE Final    Comment: (NOTE) SARS-CoV-2 target nucleic acids  are NOT DETECTED. The SARS-CoV-2 RNA is generally detectable in upper and lower respiratory specimens during the acute phase of infection. Negative results do not preclude SARS-CoV-2 infection, do not rule out co-infections with other pathogens, and should not be used as the sole basis for treatment or other patient management decisions. Negative results must be combined with clinical observations, patient history, and epidemiological information. The expected result is Negative. Fact Sheet for Patients: SugarRoll.be Fact Sheet for Healthcare Providers: https://www.woods-mathews.com/ This test is not yet approved or cleared by the [place] FDA and  has been authorized for detection and/or diagnosis of SARS-CoV-2 by FDA under an Emergency Use Authorization (EUA). This EUA will remain  in effect (meaning this test can be used) for the duration of the COVID-19 declaration under Section 56 4(b)(1) of the Act, 21 U.S.C. section 360bbb-3(b)(1), unless the authorization is terminated or revoked sooner. Performed at [facility], [place] [address], [place], [place] [address]      Discharge Instructions:   Discharge Instructions    Diet - low sodium heart healthy   Complete by: As directed    Discharge instructions  Complete by: As directed    meclizine prior to her eval there   Increase activity slowly   Complete by: As directed      Allergies as of 12/16/2018      Reactions   Codeine Itching   Milk-related Compounds Nausea And Vomiting   Just milk, as an infant dropped significant amount of weight    Other    Smells may cause asthma atack      Medication List    STOP taking these medications   gabapentin 300 MG capsule Commonly known as: NEURONTIN     TAKE these medications   alendronate 70 MG tablet Commonly known as: FOSAMAX Take 1 tablet (70 mg total) by mouth once a week. Take with a full glass of water on an  empty stomach.   budesonide-formoterol 160-4.5 MCG/ACT inhaler Commonly known as: SYMBICORT Inhale 2 puffs into the lungs 2 (two) times daily as needed (shortness of breath).   calcium-vitamin D 500-200 MG-UNIT tablet Commonly known as: OSCAL WITH D Take 1 tablet by mouth daily at 2 PM.   celecoxib 200 MG capsule Commonly known as: CeleBREX Take 1 capsule (200 mg total) by mouth 2 (two) times daily.   clonazePAM 1 MG tablet Commonly known as: KLONOPIN Take 1 mg by mouth every evening.   diazepam 5 MG tablet Commonly known as: Valium Take 1 tablet (5 mg total) by mouth daily as needed (if meclizine does not work).   FLUoxetine 10 MG tablet Commonly known as: PROZAC Take 20 mg by mouth daily.   fluticasone 50 MCG/ACT nasal spray Commonly known as: FLONASE SPRAY 2 SPRAYS INTO EACH NOSTRIL EVERY DAY What changed: See the new instructions.   levocetirizine 5 MG tablet Commonly known as: XYZAL TAKE 1 TABLET BY MOUTH EVERY DAY IN THE EVENING What changed:   how much to take  how to take this   meclizine 25 MG tablet Commonly known as: ANTIVERT Take 1 tablet (25 mg total) by mouth 3 (three) times daily as needed for dizziness.   montelukast 10 MG tablet Commonly known as: SINGULAIR TAKE 1 TABLET BY MOUTH EVERYDAY AT BEDTIME What changed: See the new instructions.   multivitamin with minerals Tabs tablet Take 2 tablets by mouth daily.   pantoprazole 40 MG tablet Commonly known as: PROTONIX TAKE 1 TABLET BY MOUTH EVERY DAY What changed: when to take this   pravastatin 20 MG tablet Commonly known as: PRAVACHOL TAKE 1 TABLET BY MOUTH EVERY DAY   PreserVision AREDS 2 Caps Take 1 capsule by mouth 2 (two) times a day.   ProAir RespiClick [id number] (90 Base) MCG/ACT Aepb Generic drug: Albuterol Sulfate INHALE 2 PUFFS INTO THE LUNGS EVERY 4 (FOUR) HOURS AS NEEDED What changed: See the new instructions.   traZODone 100 MG tablet Commonly known as: DESYREL Take 100 mg by  mouth at bedtime.            Durable Medical Equipment  (From admission, onward)         Start     Ordered   12/16/18 1150  For home use only DME Walker rolling  Once    Question:  Patient needs a walker to treat with the following condition  Answer:  Vertigo   12/16/18 1150         Follow-up Information    Susy Frizzle, MD Follow up in 1 week(s).   Specialty: Family Medicine Contact information: [address] [place] [address] [phone]  Time coordinating discharge: 25 min  Signed:  Geradine Girt DO  [facility] 12/16/2018, 12:16 PM

## 2018-12-16 NOTE — Evaluation (Signed)
Physical Therapy Evaluation Patient Details Name: Chelsea Griffith MRN: [id number] DOB: 21-Feb-1956 Today's Date: 12/16/2018   History of Present Illness  Chelsea Griffith is a 63 y.o. female with medical history significant of hyperlipidemia, osteoporosis, GERD, depression/anxiety who presents with dizziness.  She states that she was in her normal state of health yesterday.  She woke up this morning, got out of bed quickly and started to notice some mild dizziness, but was able to get dressed.  She sat on the couch for a while, and her symptoms started to get worse.  She describes the room spinning around her very quickly, also admitted to some nausea and vomiting associated with this dizziness.  She had to sit with her eyes closed for hours, and for ambulation, she had to grab onto the wall or other furniture to gain stability.  MRI negative.  pt had reverse total shoulder R 3 months ago     Clinical Impression  Pt feeling better by the time of PT eval.  I couldn't not replicate her dizziness.  Pt feeling weak and mild lateral lean to left.  Pt educated on walking with RW and boyfriend given gait belt to help keep her safe.  Pt educated on some head turns to do to help decrease her unsteadiness -she was not leaning to left at end of PT session.  Im recommending pt use RW and gait belt for safety at home. Boyfriend present to help her 24/7.  I recommend pt attend OPPT for vertigo - without having meclizine prior to her eval there.  She and boyfriend in ageement to plan.  Pt ready to go home with assist and follow up with OP.    Follow Up Recommendations Outpatient PT;Supervision for mobility/OOB(Neuro OPPT [address])    Equipment Recommendations  Rolling walker with 5" wheels(gait belt given to assist with safety at home.)    Recommendations for Other Services       Precautions / Restrictions Precautions Precautions: Fall Precaution Comments: pt with no history of  falls Restrictions Weight Bearing Restrictions: No      Mobility  Bed Mobility Overal bed mobility: Modified Independent             General bed mobility comments: HOB up  Transfers Overall transfer level: Needs assistance Equipment used: Rolling walker (2 wheeled) Transfers: Sit to/from [facility] Sit to Stand: Supervision Stand pivot transfers: Supervision          Ambulation/Gait Ambulation/Gait assistance: Min guard Gait Distance (Feet): 200 Feet Assistive device: Rolling walker (2 wheeled) Gait Pattern/deviations: Step-through pattern;Narrow base of support     General Gait Details: Pt started out walking - laterally leaning to the left.  Pt educated on wider base. pt practiced slowly turning head side to side - at first dizzier looking left but the more she did it the better (and more neutral posture) she became.  pt needed cues to keep RW in front of her.   boyfriend present for safty eduation.  gait belt given  Stairs            Wheelchair Mobility    Modified Rankin (Stroke Patients Only)       Balance Overall balance assessment: Needs assistance   Sitting balance-Leahy Scale: Good Sitting balance - Comments: pt initially leaning to left in sitting but no assist needed to maintain sitting     Standing balance-Leahy Scale: Good Standing balance comment: pt at first leaning left but not assisted needed with  static stance.  Pt feeling overall weak - RW helped her feet more secure - educated pt on importance of wide base for improved stabilty.                             Pertinent Vitals/Pain Pain Assessment: No/denies pain    Home Living Family/patient expects to be discharged to:: Private residence Living Arrangements: Spouse/significant other Available Help at Discharge: Family         Home Layout: Two level Home Equipment: None      Prior Function Level of Independence: Independent          Comments: [place] boyfriend and 4 dogs and 4 horses     Hand Dominance        Extremity/Trunk Assessment        Lower Extremity Assessment Lower Extremity Assessment: Overall WFL for tasks assessed    Cervical / Trunk Assessment Cervical / Trunk Assessment: Normal  Communication   Communication: No difficulties  Cognition Arousal/Alertness: Awake/alert Behavior During Therapy: WFL for tasks assessed/performed Overall Cognitive Status: Within Functional Limits for tasks assessed                                 General Comments: pt apprehensive about moving      General Comments General comments (skin integrity, edema, etc.): I did vertigo testing - no dizziness or deficits with eye movement or head turns.  no dizziness or nystagmus with side lying test in both directions.  pt with normal gaze stabilization.  I couldnt not replicate her dizziness today (pt on meds to help her)    Exercises     Assessment/Plan    PT Assessment Patient needs continued PT services  PT Problem List Decreased knowledge of precautions;Decreased activity tolerance;Decreased balance;Decreased knowledge of use of DME       PT Treatment Interventions DME instruction;Neuromuscular re-education;Therapeutic activities    PT Goals (Current goals can be found in the Care Plan section)  Acute Rehab PT Goals Patient Stated Goal: to stop being dizzy and feel better PT Goal Formulation: With patient/family Time For Goal Achievement: 12/23/18 Potential to Achieve Goals: Good    Frequency Min 4X/week   Barriers to discharge        Co-evaluation               AM-PAC PT "6 Clicks" Mobility  Outcome Measure Help needed turning from your back to your side while in a flat bed without using bedrails?: None Help needed moving from lying on your back to sitting on the side of a flat bed without using bedrails?: None Help needed moving to and from a bed to a chair  (including a wheelchair)?: A Little Help needed standing up from a chair using your arms (e.g., wheelchair or bedside chair)?: A Little Help needed to walk in hospital room?: A Little Help needed climbing 3-5 steps with a railing? : A Little 6 Click Score: 20    End of Session Equipment Utilized During Treatment: Gait belt Activity Tolerance: Patient tolerated treatment well Patient left: in bed;with call bell/phone within reach;with family/visitor present Nurse Communication: Mobility status;Precautions PT Visit Diagnosis: Unsteadiness on feet (R26.81);Dizziness and giddiness (R42)    Time: 1010-1110 PT Time Calculation (min) (ACUTE ONLY): 60 min   Charges:   PT Evaluation $PT Eval Low Complexity: 1 Low PT Treatments $Gait  Training: 8-22 mins $Therapeutic Activity: 8-22 mins $Neuromuscular Re-education: 8-22 mins        12/16/2018   Rande Lawman, PT   Loyal Buba 12/16/2018, 11:38 AM

## 2018-12-16 NOTE — TOC Transition Note (Signed)
Transition of Care [facility]) - CM/SW Discharge Note   Patient Details  Name: Chelsea Griffith MRN: [id number] Date of Birth: 06-Jan-1956  [facility] CM/SW Contact:  Carles Collet, RN Phone Number: 12/16/2018, 12:29 PM   Clinical Narrative:    Damaris Schooner w patient, she would like RW and OP PT set up. DME ordered to room, and referral placed in Epic to [facility] rehab for vestibular PT. No other CM needs identified.     Final next level of care: Home/Self Care Barriers to Discharge: No Barriers Identified   Patient Goals and CMS Choice Patient states their goals for this hospitalization and ongoing recovery are:: to go home   Choice offered to / list presented to : NA  Discharge Placement                       Discharge Plan and Services                DME Arranged: Walker rolling DME Agency: [employer]                  Social Determinants of Health (SDOH) Interventions     Readmission Risk Interventions No flowsheet data found.

## 2018-12-16 NOTE — Progress Notes (Signed)
Pt dc'd from unit. IV removed. Education of benign vertigo accepted by patient. All questions answered. New medications reviewed as well as follow up appointment. Pt exited in wheelchair through ED.

## 2018-12-19 ENCOUNTER — Telehealth: Payer: Self-pay | Admitting: Family Medicine

## 2018-12-19 NOTE — Telephone Encounter (Signed)
Patient called states she was put on Gabapentin the last time she was seen in office. She recently went into the ER for vertigo they advised her to stop this medication. I have made her a HFU for Thursday to discuss.  CB# [phone]

## 2018-12-21 ENCOUNTER — Other Ambulatory Visit: Payer: Self-pay

## 2018-12-21 ENCOUNTER — Ambulatory Visit (INDEPENDENT_AMBULATORY_CARE_PROVIDER_SITE_OTHER): Admitting: Family Medicine

## 2018-12-21 VITALS — BP 104/60 | HR 71 | Temp 97.7°F | Resp 18 | Ht 63.0 in | Wt 144.0 lb

## 2018-12-21 DIAGNOSIS — H8110 Benign paroxysmal vertigo, unspecified ear: Secondary | ICD-10-CM | POA: Diagnosis not present

## 2018-12-21 NOTE — Progress Notes (Signed)
Subjective:    Patient ID: Chelsea Griffith, female    DOB: October 08, 1955, 63 y.o.   MRN: [id number]  HPI  12/05/18 1 month history of recurrent pruritic papular rash in a dermatomal pattern on left posterior shoulder.  Comes and goes cyclically over the last month.  Very itchy.  Also burns. Nowhere else on the body.  No other symptoms. No sick contacts.   At that time, my plan was: Could be neuropathic itching due to thoracic radiculopathy. Try gabapentin 300 mg potid prn  Could be scabies.  Try elimite cream x 1  Could be a herpetiform eruption.  HSV culture sent.   Reassess for improvement in 1 week to help differentiate.   12/21/18 Patient was admitted to the hospital from October 10 through October 11.  I have copied relevant portions of the discharge summary below for my reference:  History of Present Illness:   Chelsea Griffith a 63 y.o.femalewith medical history significant ofhyperlipidemia, osteoporosis, GERD, depression/anxiety who presents withdizziness. She states that she was in her normal state of health yesterday. She woke up this morning, got out of bed quickly and started to notice some mild dizziness, but was able to get dressed. She sat on the couch for a while, and her symptoms started to get worse. She describes the room spinning around her very quickly, also admitted to some nausea and vomiting associated with this dizziness. She had to sit with her eyes closed for hours, and for ambulation, she had to grab onto the wall or other furniture to gain stability. She states that she has had thesesymptoms in a mild capacity a few times this past year, but has never been this severe, or lasted this long. She had not needed to seek medical care for those episodes. She denies any fevers, chest pain or shortness of breath. No abdominal pain. She does endorse some mild occipital headache that started this afternoon. She also admits to generalized weakness.    Hospital Course by Problem:   Intractable dizziness -Secondary to vertigo/BPPV -MRI brain without acute intracranial abnormality -Continue Valium, meclizine -PTevaluation for BPPV-- outpatient follow up  Depression/anxiety -Continue Prozac,Klonopin nightly  GERD -ContinueProtonix   Hyperlipidemia -ContinuePravachol  Poor PO intake -encouraged a regular diet -east maybe 1 meal/day   Procedures and Diagnostic Studies:   Mr Brain Wo Contrast (neuro Protocol)  Result Date: 12/15/2018 CLINICAL DATA:  Vertigo, persistent, central. Additional history provided: Patient reports trouble with balance, patient reports room feels like it is spinning, worse with eyes open, nausea. EXAM: MRI HEAD WITHOUT CONTRAST TECHNIQUE: Multiplanar, multiecho pulse sequences of the brain and surrounding structures were obtained without intravenous contrast. COMPARISON:  MRA head 04/25/2015, head CT 04/17/2015 FINDINGS: Brain: There is no convincing evidence of acute infarct. No evidence of intracranial mass. No midline shift or extra-axial fluid collection. No chronic intracranial blood products. No focal parenchymal signal abnormality Cerebral volume is normal for age. Vascular: Flow voids maintained within the proximal large arterial vessels. Skull and upper cervical spine: No focal marrow lesion Sinuses/Orbits: Visualized orbits demonstrate no acute abnormality. Trace ethmoid sinus mucosal thickening. No significant mastoid effusion. IMPRESSION: Normal MRI appearance of the brain for age. No evidence of acute intracranial abnormality. Electronically Signed   By: Kellie Simmering   On: 12/15/2018 14:54    Patient is here today for follow-up.  Of note herpes culture was negative obtained at her last visit.  Of note, the patient states that the gabapentin did help with inching  suggesting that the itching was likely neuropathic in nature.  However while she was taking the gabapentin she did notice  that her balance was affected.  However the episode the center to the hospital, she sat up rapidly in bed and the room began to spin around her.  She was unable to stand up.  She began vomiting.  She may have even had a vasovagal event and passed out.  Her significant other returned home and took her to the hospital.  There an MRI of the brain was completely normal.  She was treated with a combination of meclizine and Valium.  The symptoms improved gradually over several hours.  She has not had another attack since leaving the hospital although she still feels slightly off balance.  However she definitely experienced the room spinning around her and had witnessed nystagmus when the event occurred. Past Medical History:  Diagnosis Date  . Anxiety   . Arthritis    fingers and thumbs  . Asthma   . Depression   . GERD (gastroesophageal reflux disease)   . History of anemia   . Hyperlipidemia   . Osteoporosis    right hip  . Rotator cuff tear, right    Past Surgical History:  Procedure Laterality Date  . ABDOMINAL HYSTERECTOMY    . CESAREAN SECTION      2 times  . COLONOSCOPY    . DILATION AND CURETTAGE OF UTERUS     several  . ELBOW SURGERY Bilateral     2times/1 time on left elbow  . KNEE SURGERY     right knee  . PARTIAL HYSTERECTOMY     2 times  . REVERSE SHOULDER ARTHROPLASTY Right 09/06/2018   Procedure: REVERSE SHOULDER ARTHROPLASTY;  Surgeon: Hiram Gash, MD;  Location: [facility] ORS;  Service: Orthopedics;  Laterality: Right;  . ROTATOR CUFF REPAIR     right shoulderx2    Current Outpatient Medications on File Prior to Visit  Medication Sig Dispense Refill  . alendronate (FOSAMAX) 70 MG tablet Take 1 tablet (70 mg total) by mouth once a week. Take with a full Griffith of water on an empty stomach. 30 tablet 2  . budesonide-formoterol (SYMBICORT) 160-4.5 MCG/ACT inhaler Inhale 2 puffs into the lungs 2 (two) times daily as needed (shortness of breath).    . calcium-vitamin D (OSCAL WITH  D) 500-200 MG-UNIT per tablet Take 1 tablet by mouth daily at 2 PM.     . celecoxib (CELEBREX) 200 MG capsule Take 1 capsule (200 mg total) by mouth 2 (two) times daily. 60 capsule 2  . clonazePAM (KLONOPIN) 1 MG tablet Take 1 mg by mouth every evening.     . diazepam (VALIUM) 5 MG tablet Take 1 tablet (5 mg total) by mouth daily as needed (if meclizine does not work). 5 tablet 0  . FLUoxetine (PROZAC) 10 MG tablet Take 20 mg by mouth daily.    . fluticasone (FLONASE) 50 MCG/ACT nasal spray SPRAY 2 SPRAYS INTO EACH NOSTRIL EVERY DAY (Patient taking differently: Place 2 sprays into both nostrils daily as needed for allergies. ) 48 mL 2  . levocetirizine (XYZAL) 5 MG tablet TAKE 1 TABLET BY MOUTH EVERY DAY IN THE EVENING (Patient taking differently: Take 5 mg by mouth every evening. ) 90 tablet 3  . meclizine (ANTIVERT) 25 MG tablet Take 1 tablet (25 mg total) by mouth 3 (three) times daily as needed for dizziness. 20 tablet 0  . montelukast (SINGULAIR) 10 MG tablet TAKE  1 TABLET BY MOUTH EVERYDAY AT BEDTIME (Patient taking differently: Take 10 mg by mouth at bedtime. ) 90 tablet 1  . Multiple Vitamin (MULTIVITAMIN WITH MINERALS) TABS tablet Take 2 tablets by mouth daily.    . Multiple Vitamins-Minerals (PRESERVISION AREDS 2) CAPS Take 1 capsule by mouth 2 (two) times a day.    . pantoprazole (PROTONIX) 40 MG tablet TAKE 1 TABLET BY MOUTH EVERY DAY (Patient taking differently: Take 40 mg by mouth at bedtime. ) 90 tablet 3  . pravastatin (PRAVACHOL) 20 MG tablet TAKE 1 TABLET BY MOUTH EVERY DAY 90 tablet 1  . PROAIR RESPICLICK 123XX123 (90 Base) MCG/ACT AEPB INHALE 2 PUFFS INTO THE LUNGS EVERY 4 (FOUR) HOURS AS NEEDED (Patient taking differently: Take 2 puffs by mouth daily as needed (For shortness of breath). ) 1 each 1  . traZODone (DESYREL) 100 MG tablet Take 100 mg by mouth at bedtime.      No current facility-administered medications on file prior to visit.    Allergies  Allergen Reactions  .  Codeine Itching  . Milk-Related Compounds Nausea And Vomiting    Just milk, as an infant dropped significant amount of weight   . Other     Smells may cause asthma atack   Social History   Socioeconomic History  . Marital status: Widowed    Spouse name: Not on file  . Number of children: Not on file  . Years of education: Not on file  . Highest education level: Not on file  Occupational History  . Not on file  Social Needs  . Financial resource strain: Not on file  . Food insecurity    Worry: Not on file    Inability: Not on file  . Transportation needs    Medical: Not on file    Non-medical: Not on file  Tobacco Use  . Smoking status: Never Smoker  . Smokeless tobacco: Never Used  Substance and Sexual Activity  . Alcohol use: Yes    Alcohol/week: 1.0 standard drinks    Types: 1 Shots of liquor per week    Comment: occasional weekly drink  . Drug use: No  . Sexual activity: Yes    Comment: single  Lifestyle  . Physical activity    Days per week: Not on file    Minutes per session: Not on file  . Stress: Not on file  Relationships  . Social Herbalist on phone: Not on file    Gets together: Not on file    Attends religious service: Not on file    Active member of club or organization: Not on file    Attends meetings of clubs or organizations: Not on file    Relationship status: Not on file  . Intimate partner violence    Fear of current or ex partner: Not on file    Emotionally abused: Not on file    Physically abused: Not on file    Forced sexual activity: Not on file  Other Topics Concern  . Not on file  Social History Narrative  . Not on file      Review of Systems  All other systems reviewed and are negative.      Objective:   Physical Exam  Constitutional: She appears well-developed and well-nourished. No distress.  HENT:  Right Ear: External ear normal.  Left Ear: External ear normal.  Mouth/Throat: Oropharynx is clear and moist.  No oropharyngeal exudate.  Eyes: Conjunctivae are normal.  Neck: Neck supple.  Cardiovascular: Normal rate, regular rhythm and normal heart sounds.  No murmur heard. Pulmonary/Chest: Effort normal. She has no decreased breath sounds. She has no wheezes. She has no rhonchi. She has no rales. She exhibits no tenderness.  Lymphadenopathy:    She has no cervical adenopathy.  Skin: She is not diaphoretic.  Vitals reviewed.         Assessment & Plan:  I spent 30 minutes today with the patient reviewing her hospital records, discussing her history, and explaining the etiology of BPPV.  I answered all the patient's questions.  I reassured the patient that this is an isolated event that can occur.  I explained what triggers the event to happen.  I reassured her that the meclizine and Valium can be used as needed when the events occur and if this becomes more problematic she could have Epley maneuvers performed through physical therapy.  However at the present time she is doing well and therefore is not taking any further medication.  I did recommend that she stay away from the gabapentin.  If itching worsens, we could try a lower dose of gabapentin for neuropathic itching.

## 2019-02-04 ENCOUNTER — Other Ambulatory Visit: Payer: Self-pay | Admitting: Family Medicine

## 2019-02-04 DIAGNOSIS — L299 Pruritus, unspecified: Secondary | ICD-10-CM

## 2019-03-03 ENCOUNTER — Other Ambulatory Visit: Payer: Self-pay | Admitting: Family Medicine

## 2019-03-03 DIAGNOSIS — J45901 Unspecified asthma with (acute) exacerbation: Secondary | ICD-10-CM

## 2019-03-03 DIAGNOSIS — J069 Acute upper respiratory infection, unspecified: Secondary | ICD-10-CM

## 2019-03-10 ENCOUNTER — Other Ambulatory Visit: Payer: Self-pay | Admitting: Family Medicine

## 2019-03-10 DIAGNOSIS — J45901 Unspecified asthma with (acute) exacerbation: Secondary | ICD-10-CM

## 2019-03-16 ENCOUNTER — Other Ambulatory Visit: Admitting: Family Medicine

## 2019-03-23 ENCOUNTER — Other Ambulatory Visit: Payer: Self-pay | Admitting: Family Medicine

## 2019-03-23 DIAGNOSIS — J069 Acute upper respiratory infection, unspecified: Secondary | ICD-10-CM

## 2019-03-23 DIAGNOSIS — J45901 Unspecified asthma with (acute) exacerbation: Secondary | ICD-10-CM

## 2019-04-05 ENCOUNTER — Encounter: Payer: Self-pay | Admitting: Family Medicine

## 2019-04-24 ENCOUNTER — Other Ambulatory Visit: Payer: Self-pay | Admitting: Family Medicine

## 2019-04-24 DIAGNOSIS — J45901 Unspecified asthma with (acute) exacerbation: Secondary | ICD-10-CM

## 2019-04-24 DIAGNOSIS — J069 Acute upper respiratory infection, unspecified: Secondary | ICD-10-CM

## 2019-04-25 ENCOUNTER — Ambulatory Visit: Admitting: Nurse Practitioner

## 2019-04-26 ENCOUNTER — Ambulatory Visit: Admitting: Nurse Practitioner

## 2019-07-08 ENCOUNTER — Other Ambulatory Visit: Payer: Self-pay | Admitting: Family Medicine

## 2019-07-11 DIAGNOSIS — H353131 Nonexudative age-related macular degeneration, bilateral, early dry stage: Secondary | ICD-10-CM | POA: Diagnosis not present

## 2019-07-12 DIAGNOSIS — L570 Actinic keratosis: Secondary | ICD-10-CM | POA: Diagnosis not present

## 2019-07-12 DIAGNOSIS — D1801 Hemangioma of skin and subcutaneous tissue: Secondary | ICD-10-CM | POA: Diagnosis not present

## 2019-07-12 DIAGNOSIS — D225 Melanocytic nevi of trunk: Secondary | ICD-10-CM | POA: Diagnosis not present

## 2019-07-12 DIAGNOSIS — D485 Neoplasm of uncertain behavior of skin: Secondary | ICD-10-CM | POA: Diagnosis not present

## 2019-07-12 DIAGNOSIS — L439 Lichen planus, unspecified: Secondary | ICD-10-CM | POA: Diagnosis not present

## 2019-07-30 ENCOUNTER — Encounter: Admitting: Nurse Practitioner

## 2019-08-20 ENCOUNTER — Other Ambulatory Visit: Payer: Self-pay

## 2019-08-20 ENCOUNTER — Telehealth: Payer: Self-pay | Admitting: Family Medicine

## 2019-08-20 MED ORDER — SCOPOLAMINE 1 MG/3DAYS TD PT72: 1.0000 | MEDICATED_PATCH | TRANSDERMAL | 0 refills | Status: DC

## 2019-08-20 NOTE — Telephone Encounter (Signed)
Rx sent 

## 2019-08-20 NOTE — Telephone Encounter (Signed)
CB# [phone] Pt going to [place] on the 08-24-19 need medication patch pharmacy [facility] [address] pt would  Like 2 patches

## 2019-08-20 NOTE — Telephone Encounter (Signed)
Rx refilled.

## 2019-09-06 ENCOUNTER — Other Ambulatory Visit: Payer: Self-pay | Admitting: Family Medicine

## 2019-10-21 ENCOUNTER — Other Ambulatory Visit: Payer: Self-pay | Admitting: Family Medicine

## 2019-10-30 ENCOUNTER — Other Ambulatory Visit: Payer: Self-pay | Admitting: *Deleted

## 2019-10-30 DIAGNOSIS — E785 Hyperlipidemia, unspecified: Secondary | ICD-10-CM

## 2019-10-30 DIAGNOSIS — Z0000 Encounter for general adult medical examination without abnormal findings: Secondary | ICD-10-CM

## 2019-10-30 DIAGNOSIS — M810 Age-related osteoporosis without current pathological fracture: Secondary | ICD-10-CM

## 2019-11-20 ENCOUNTER — Other Ambulatory Visit: Payer: Medicare Other

## 2019-11-20 ENCOUNTER — Other Ambulatory Visit: Payer: Self-pay

## 2019-11-20 DIAGNOSIS — M810 Age-related osteoporosis without current pathological fracture: Secondary | ICD-10-CM

## 2019-11-20 DIAGNOSIS — E785 Hyperlipidemia, unspecified: Secondary | ICD-10-CM

## 2019-11-20 DIAGNOSIS — Z0000 Encounter for general adult medical examination without abnormal findings: Secondary | ICD-10-CM | POA: Diagnosis not present

## 2019-11-20 LAB — CBC WITH DIFFERENTIAL/PLATELET
Absolute Monocytes: 321 cells/uL (ref 200–950)
Basophils Absolute: 20 cells/uL (ref 0–200)
Basophils Relative: 0.4 %
Eosinophils Absolute: 61 cells/uL (ref 15–500)
Eosinophils Relative: 1.2 %
HCT: 41.1 % (ref 35.0–45.0)
Hemoglobin: 13.7 g/dL (ref 11.7–15.5)
Lymphs Abs: 1408 cells/uL (ref 850–3900)
MCH: 29.8 pg (ref 27.0–33.0)
MCHC: 33.3 g/dL (ref 32.0–36.0)
MCV: 89.5 fL (ref 80.0–100.0)
MPV: 10.7 fL (ref 7.5–12.5)
Monocytes Relative: 6.3 %
Neutro Abs: 3290 cells/uL (ref 1500–7800)
Neutrophils Relative %: 64.5 %
Platelets: 184 10*3/uL (ref 140–400)
RBC: 4.59 10*6/uL (ref 3.80–5.10)
RDW: 13.1 % (ref 11.0–15.0)
Total Lymphocyte: 27.6 %
WBC: 5.1 10*3/uL (ref 3.8–10.8)

## 2019-11-20 LAB — COMPLETE METABOLIC PANEL WITH GFR
AG Ratio: 2.1 (calc) (ref 1.0–2.5)
ALT: 16 U/L (ref 6–29)
AST: 16 U/L (ref 10–35)
Albumin: 4.4 g/dL (ref 3.6–5.1)
Alkaline phosphatase (APISO): 75 U/L (ref 37–153)
BUN: 19 mg/dL (ref 7–25)
CO2: 30 mmol/L (ref 20–32)
Calcium: 9.6 mg/dL (ref 8.6–10.4)
Chloride: 104 mmol/L (ref 98–110)
Creat: 0.76 mg/dL (ref 0.50–0.99)
GFR, Est African American: 95 mL/min/{1.73_m2} (ref >=60)
GFR, Est Non African American: 82 mL/min/{1.73_m2} (ref >=60)
Globulin: 2.1 g/dL (calc) (ref 1.9–3.7)
Glucose, Bld: 107 mg/dL — ABNORMAL HIGH (ref 65–99)
Potassium: 3.9 mmol/L (ref 3.5–5.3)
Sodium: 142 mmol/L (ref 135–146)
Total Bilirubin: 0.5 mg/dL (ref 0.2–1.2)
Total Protein: 6.5 g/dL (ref 6.1–8.1)

## 2019-11-20 LAB — VITAMIN D 25 HYDROXY (VIT D DEFICIENCY, FRACTURES): Vit D, 25-Hydroxy: 43 ng/mL (ref 30–100)

## 2019-11-20 LAB — LIPID PANEL
Cholesterol: 151 mg/dL (ref <200)
HDL: 52 mg/dL (ref >=50)
LDL Cholesterol (Calc): 80 mg/dL (calc)
Non-HDL Cholesterol (Calc): 99 mg/dL (calc) (ref <130)
Total CHOL/HDL Ratio: 2.9 (calc) (ref <5.0)
Triglycerides: 107 mg/dL (ref <150)

## 2019-11-21 ENCOUNTER — Other Ambulatory Visit: Payer: Self-pay | Admitting: Family Medicine

## 2019-11-22 ENCOUNTER — Ambulatory Visit (INDEPENDENT_AMBULATORY_CARE_PROVIDER_SITE_OTHER): Payer: Medicare Other | Admitting: Family Medicine

## 2019-11-22 ENCOUNTER — Other Ambulatory Visit: Payer: Self-pay

## 2019-11-22 ENCOUNTER — Encounter: Payer: Self-pay | Admitting: Family Medicine

## 2019-11-22 VITALS — BP 120/70 | Temp 97.6°F | Resp 18 | Ht 63.0 in | Wt 144.0 lb

## 2019-11-22 DIAGNOSIS — Z0000 Encounter for general adult medical examination without abnormal findings: Secondary | ICD-10-CM | POA: Diagnosis not present

## 2019-11-22 DIAGNOSIS — Z1231 Encounter for screening mammogram for malignant neoplasm of breast: Secondary | ICD-10-CM

## 2019-11-22 DIAGNOSIS — Z23 Encounter for immunization: Secondary | ICD-10-CM | POA: Diagnosis not present

## 2019-11-22 NOTE — Progress Notes (Signed)
Subjective:    Patient ID: Chelsea Griffith, female    DOB: [birth year], 64 y.o.   MRN: [id number]  HPI Patient is here today for complete physical exam.  Her colonoscopy was in 2016 and is up-to-date.  Patient has had both doses of the Covid vaccine.  She received her flu shot today.  She is due for Pneumovax 23.  She is also due for the shingles vaccine.  She would like to defer these at the present time.  She has a history of a hysterectomy and therefore does not require a Pap smear.  She is due for her mammogram.  She had a bone density test last year that showed osteopenia with a T score of -2.3.  She is not taking calcium or vitamin D.  She denies any falls, depression, or memory loss Immunization History  Administered Date(s) Administered  . Fluad Quad(high Dose 65+) 11/22/2019  . Influenza,inj,Quad PF,6+ Mos 04/04/2017, 04/07/2018, 11/01/2018  . Tdap 04/13/2013   Past Medical History:  Diagnosis Date  . Anxiety   . Arthritis    fingers and thumbs  . Asthma   . Depression   . GERD (gastroesophageal reflux disease)   . History of anemia   . Hyperlipidemia   . Osteoporosis    right hip  . Rotator cuff tear, right    Past Surgical History:  Procedure Laterality Date  . ABDOMINAL HYSTERECTOMY    . CESAREAN SECTION      2 times  . COLONOSCOPY    . DILATION AND CURETTAGE OF UTERUS     several  . ELBOW SURGERY Bilateral     2times/1 time on left elbow  . KNEE SURGERY     right knee  . PARTIAL HYSTERECTOMY     2 times  . REVERSE SHOULDER ARTHROPLASTY Right 09/06/2018   Procedure: REVERSE SHOULDER ARTHROPLASTY;  Surgeon: Hiram Gash, MD;  Location: [facility];  Service: Orthopedics;  Laterality: Right;  . ROTATOR CUFF REPAIR     right shoulderx2    Current Outpatient Medications on File Prior to Visit  Medication Sig Dispense Refill  . albuterol (PROAIR HFA) 108 (90 Base) MCG/ACT inhaler Inhale 2 puffs into the lungs every 4 (four) hours as needed for wheezing or shortness  of breath. 18 g 2  . budesonide-formoterol (SYMBICORT) 160-4.5 MCG/ACT inhaler Inhale 2 puffs into the lungs 2 (two) times daily as needed (shortness of breath).    . calcium-vitamin D (OSCAL WITH D) 500-200 MG-UNIT per tablet Take 1 tablet by mouth daily at 2 PM.     . clonazePAM (KLONOPIN) 1 MG tablet Take 1 mg by mouth every evening.     Marland Kitchen FLUoxetine (PROZAC) 10 MG tablet Take 20 mg by mouth daily.    . fluticasone (FLONASE) 50 MCG/ACT nasal spray SPRAY 2 SPRAYS INTO EACH NOSTRIL EVERY DAY (Patient taking differently: Place 2 sprays into both nostrils daily as needed for allergies. ) 48 mL 2  . gabapentin (NEURONTIN) 300 MG capsule TAKE 1 CAPSULE BY MOUTH THREE TIMES A DAY AS NEEDED FOR NERVE ITCHING 90 capsule 0  . levocetirizine (XYZAL) 5 MG tablet TAKE 1 TABLET BY MOUTH EVERY DAY IN THE EVENING 90 tablet 3  . meclizine (ANTIVERT) 25 MG tablet Take 1 tablet (25 mg total) by mouth 3 (three) times daily as needed for dizziness. 20 tablet 0  . montelukast (SINGULAIR) 10 MG tablet TAKE 1 TABLET BY MOUTH EVERYDAY AT BEDTIME 90 tablet 3  . Multiple Vitamin (  MULTIVITAMIN WITH MINERALS) TABS tablet Take 2 tablets by mouth daily.    . Multiple Vitamins-Minerals (PRESERVISION AREDS 2) CAPS Take 1 capsule by mouth 2 (two) times a day.    . pantoprazole (PROTONIX) 40 MG tablet TAKE 1 TABLET EVERY DAY 90 tablet 0  . pravastatin (PRAVACHOL) 20 MG tablet TAKE 1 TABLET BY MOUTH EVERY DAY 90 tablet 1  . scopolamine (TRANSDERM SCOP, 1.5 MG,) 1 MG/3DAYS Place 1 patch (1.5 mg total) onto the skin every 3 (three) days. 2 patch 0  . traZODone (DESYREL) 100 MG tablet Take 100 mg by mouth at bedtime.      No current facility-administered medications on file prior to visit.   Allergies  Allergen Reactions  . Codeine Itching  . Milk-Related Compounds Nausea And Vomiting    Just milk, as an infant dropped significant amount of weight   . Other     Smells may cause asthma atack   Social History   Socioeconomic  History  . Marital status: Widowed    Spouse name: Not on file  . Number of children: Not on file  . Years of education: Not on file  . Highest education level: Not on file  Occupational History  . Not on file  Tobacco Use  . Smoking status: Never Smoker  . Smokeless tobacco: Never Used  Vaping Use  . Vaping Use: Never used  Substance and Sexual Activity  . Alcohol use: Yes    Alcohol/week: 1.0 standard drink    Types: 1 Shots of liquor per week    Comment: occasional weekly drink  . Drug use: No  . Sexual activity: Yes    Comment: single  Other Topics Concern  . Not on file  Social History Narrative  . Not on file   Social Determinants of Health   Financial Resource Strain:   . Difficulty of Paying Living Expenses: Not on file  Food Insecurity:   . Worried About Charity fundraiser in the Last Year: Not on file  . Ran Out of Food in the Last Year: Not on file  Transportation Needs:   . Lack of Transportation (Medical): Not on file  . Lack of Transportation (Non-Medical): Not on file  Physical Activity:   . Days of Exercise per Week: Not on file  . Minutes of Exercise per Session: Not on file  Stress:   . Feeling of Stress : Not on file  Social Connections:   . Frequency of Communication with Friends and Family: Not on file  . Frequency of Social Gatherings with Friends and Family: Not on file  . Attends Religious Services: Not on file  . Active Member of Clubs or Organizations: Not on file  . Attends Archivist Meetings: Not on file  . Marital Status: Not on file  Intimate Partner Violence:   . Fear of Current or Ex-Partner: Not on file  . Emotionally Abused: Not on file  . Physically Abused: Not on file  . Sexually Abused: Not on file   Family History  Problem Relation Age of Onset  . Hyperlipidemia Mother   . ADD / ADHD Son   . Heart disease Maternal Grandmother   . Colon cancer Neg Hx   . Esophageal cancer Neg Hx   . Rectal cancer Neg Hx   .  Stomach cancer Neg Hx       Review of Systems  All other systems reviewed and are negative.      Objective:  Physical Exam Vitals reviewed.  Constitutional:      General: She is not in acute distress.    Appearance: She is well-developed. She is not diaphoretic.  HENT:     Head: Normocephalic and atraumatic.     Right Ear: External ear normal.     Left Ear: External ear normal.     Nose: Nose normal.     Mouth/Throat:     Pharynx: No oropharyngeal exudate.  Eyes:     General: No scleral icterus.       Right eye: No discharge.        Left eye: No discharge.     Conjunctiva/sclera: Conjunctivae normal.     Pupils: Pupils are equal, round, and reactive to light.  Neck:     Thyroid: No thyromegaly.     Vascular: No JVD.     Trachea: No tracheal deviation.  Cardiovascular:     Rate and Rhythm: Normal rate and regular rhythm.     Heart sounds: Normal heart sounds. No murmur heard.  No friction rub. No gallop.   Pulmonary:     Effort: Pulmonary effort is normal. No respiratory distress.     Breath sounds: Normal breath sounds. No stridor. No wheezing or rales.  Chest:     Chest wall: No tenderness.  Abdominal:     General: Bowel sounds are normal. There is no distension.     Palpations: Abdomen is soft. There is no mass.     Tenderness: There is no abdominal tenderness. There is no guarding or rebound.  Musculoskeletal:        General: No tenderness or deformity. Normal range of motion.     Cervical back: Normal range of motion and neck supple.  Lymphadenopathy:     Cervical: No cervical adenopathy.  Skin:    General: Skin is warm.     Coloration: Skin is not pale.     Findings: No erythema or rash.  Neurological:     Mental Status: She is alert and oriented to person, place, and time.     Cranial Nerves: No cranial nerve deficit.     Motor: No abnormal muscle tone.     Coordination: Coordination normal.     Deep Tendon Reflexes: Reflexes are normal and  symmetric.  Psychiatric:        Behavior: Behavior normal.        Thought Content: Thought content normal.        Judgment: Judgment normal.           Assessment & Plan:  Need for immunization against influenza - Plan: Flu Vaccine QUAD High Dose(Fluad)  Encounter for screening mammogram for malignant neoplasm of breast - Plan: MM Digital Screening  Routine general medical examination at a health care facility  I will schedule the patient for mammogram.  Lab work is significant only for a mildly elevated blood sugar of 107.  We discussed a low carbohydrate diet.  Colonoscopy is not due until 2016.  Pap smear is not required.  Recommended calcium 1200 mg a day and vitamin D 1000 units a day and recheck a bone density test next year.  Patient received her flu shot.  Recommended Pneumovax 23.  Recommended the shingles vaccine.

## 2019-11-23 ENCOUNTER — Other Ambulatory Visit: Payer: Self-pay | Admitting: Family Medicine

## 2019-11-23 DIAGNOSIS — Z0000 Encounter for general adult medical examination without abnormal findings: Secondary | ICD-10-CM

## 2019-12-12 ENCOUNTER — Ambulatory Visit: Payer: PRIVATE HEALTH INSURANCE

## 2019-12-22 ENCOUNTER — Other Ambulatory Visit: Payer: Self-pay | Admitting: Family Medicine

## 2020-01-03 ENCOUNTER — Ambulatory Visit: Payer: PRIVATE HEALTH INSURANCE

## 2020-01-15 ENCOUNTER — Other Ambulatory Visit: Payer: Self-pay

## 2020-01-15 ENCOUNTER — Ambulatory Visit (INDEPENDENT_AMBULATORY_CARE_PROVIDER_SITE_OTHER): Payer: Medicare Other | Admitting: Family Medicine

## 2020-01-15 VITALS — BP 110/80 | HR 67 | Temp 97.6°F | Ht 63.0 in | Wt 174.0 lb

## 2020-01-15 DIAGNOSIS — J310 Chronic rhinitis: Secondary | ICD-10-CM

## 2020-01-15 DIAGNOSIS — J329 Chronic sinusitis, unspecified: Secondary | ICD-10-CM | POA: Diagnosis not present

## 2020-01-15 MED ORDER — AMOXICILLIN-POT CLAVULANATE 875-125 MG PO TABS: 1.0000 | ORAL_TABLET | Freq: Two times a day (BID) | ORAL | 0 refills | Status: DC

## 2020-01-15 NOTE — Progress Notes (Signed)
Subjective:    Patient ID: Chelsea Griffith, female    DOB: Sep 18, 1955, 64 y.o.   MRN: [id number]  HPI Patient states that she has been sick for more than a week.  However symptoms worsen dramatically Sunday night.  Since that time she reports pain and pressure in both maxillary sinuses.  She reports postnasal drip.  She reports head congestion.  She reports subjective fevers.  She also has a nonproductive cough.  She has a history of sinus infections.  She feels similar to her previous sinus infections.  She also reports pain and pressure in both ears and phonophobia. Past Medical History:  Diagnosis Date  . Anxiety   . Arthritis    fingers and thumbs  . Asthma   . Depression   . GERD (gastroesophageal reflux disease)   . History of anemia   . Hyperlipidemia   . Osteoporosis    right hip  . Rotator cuff tear, right    Past Surgical History:  Procedure Laterality Date  . ABDOMINAL HYSTERECTOMY    . CESAREAN SECTION      2 times  . COLONOSCOPY    . DILATION AND CURETTAGE OF UTERUS     several  . ELBOW SURGERY Bilateral     2times/1 time on left elbow  . KNEE SURGERY     right knee  . PARTIAL HYSTERECTOMY     2 times  . REVERSE SHOULDER ARTHROPLASTY Right 09/06/2018   Procedure: REVERSE SHOULDER ARTHROPLASTY;  Surgeon: Hiram Gash, MD;  Location: [facility];  Service: Orthopedics;  Laterality: Right;  . ROTATOR CUFF REPAIR     right shoulderx2    Current Outpatient Medications on File Prior to Visit  Medication Sig Dispense Refill  . albuterol (PROAIR HFA) 108 (90 Base) MCG/ACT inhaler Inhale 2 puffs into the lungs every 4 (four) hours as needed for wheezing or shortness of breath. 18 g 2  . budesonide-formoterol (SYMBICORT) 160-4.5 MCG/ACT inhaler Inhale 2 puffs into the lungs 2 (two) times daily as needed (shortness of breath).    . calcium-vitamin D (OSCAL WITH D) 500-200 MG-UNIT per tablet Take 1 tablet by mouth daily at 2 PM.     . clonazePAM (KLONOPIN) 1 MG tablet Take  1 mg by mouth every evening.     Marland Kitchen FLUoxetine (PROZAC) 10 MG tablet Take 20 mg by mouth daily.    . fluticasone (FLONASE) 50 MCG/ACT nasal spray SPRAY 2 SPRAYS INTO EACH NOSTRIL EVERY DAY (Patient taking differently: Place 2 sprays into both nostrils daily as needed for allergies. ) 48 mL 2  . gabapentin (NEURONTIN) 300 MG capsule TAKE 1 CAPSULE BY MOUTH THREE TIMES A DAY AS NEEDED FOR NERVE ITCHING 90 capsule 0  . levocetirizine (XYZAL) 5 MG tablet TAKE 1 TABLET BY MOUTH EVERY DAY IN THE EVENING 90 tablet 3  . meclizine (ANTIVERT) 25 MG tablet Take 1 tablet (25 mg total) by mouth 3 (three) times daily as needed for dizziness. 20 tablet 0  . montelukast (SINGULAIR) 10 MG tablet TAKE 1 TABLET BY MOUTH EVERYDAY AT BEDTIME 90 tablet 3  . Multiple Vitamin (MULTIVITAMIN WITH MINERALS) TABS tablet Take 2 tablets by mouth daily.    . Multiple Vitamins-Minerals (PRESERVISION AREDS 2) CAPS Take 1 capsule by mouth 2 (two) times a day.    . pantoprazole (PROTONIX) 40 MG tablet TAKE 1 TABLET EVERY DAY 90 tablet 0  . pravastatin (PRAVACHOL) 20 MG tablet TAKE 1 TABLET BY MOUTH EVERY DAY 90 tablet  1  . scopolamine (TRANSDERM SCOP, 1.5 MG,) 1 MG/3DAYS Place 1 patch (1.5 mg total) onto the skin every 3 (three) days. 2 patch 0  . traZODone (DESYREL) 100 MG tablet Take 100 mg by mouth at bedtime.      No current facility-administered medications on file prior to visit.   Allergies  Allergen Reactions  . Codeine Itching  . Milk-Related Compounds Nausea And Vomiting    Just milk, as an infant dropped significant amount of weight   . Other     Smells may cause asthma atack   Social History   Socioeconomic History  . Marital status: Widowed    Spouse name: Not on file  . Number of children: Not on file  . Years of education: Not on file  . Highest education level: Not on file  Occupational History  . Not on file  Tobacco Use  . Smoking status: Never Smoker  . Smokeless tobacco: Never Used  Vaping Use    . Vaping Use: Never used  Substance and Sexual Activity  . Alcohol use: Yes    Alcohol/week: 1.0 standard drink    Types: 1 Shots of liquor per week    Comment: occasional weekly drink  . Drug use: No  . Sexual activity: Yes    Comment: single  Other Topics Concern  . Not on file  Social History Narrative  . Not on file   Social Determinants of Health   Financial Resource Strain:   . Difficulty of Paying Living Expenses: Not on file  Food Insecurity:   . Worried About Charity fundraiser in the Last Year: Not on file  . Ran Out of Food in the Last Year: Not on file  Transportation Needs:   . Lack of Transportation (Medical): Not on file  . Lack of Transportation (Non-Medical): Not on file  Physical Activity:   . Days of Exercise per Week: Not on file  . Minutes of Exercise per Session: Not on file  Stress:   . Feeling of Stress : Not on file  Social Connections:   . Frequency of Communication with Friends and Family: Not on file  . Frequency of Social Gatherings with Friends and Family: Not on file  . Attends Religious Services: Not on file  . Active Member of Clubs or Organizations: Not on file  . Attends Archivist Meetings: Not on file  . Marital Status: Not on file  Intimate Partner Violence:   . Fear of Current or Ex-Partner: Not on file  . Emotionally Abused: Not on file  . Physically Abused: Not on file  . Sexually Abused: Not on file      Review of Systems  All other systems reviewed and are negative.      Objective:   Physical Exam Vitals reviewed.  Constitutional:      General: She is not in acute distress.    Appearance: She is well-developed. She is not diaphoretic.  HENT:     Right Ear: External ear normal.     Left Ear: External ear normal.     Nose: Mucosal edema and rhinorrhea present.     Right Sinus: Frontal sinus tenderness present.     Left Sinus: Frontal sinus tenderness present.     Mouth/Throat:     Pharynx: No  oropharyngeal exudate.  Eyes:     Conjunctiva/sclera: Conjunctivae normal.  Cardiovascular:     Rate and Rhythm: Normal rate and regular rhythm.  Heart sounds: Normal heart sounds. No murmur heard.   Pulmonary:     Effort: Pulmonary effort is normal.     Breath sounds: No decreased breath sounds, wheezing, rhonchi or rales.  Chest:     Chest wall: No tenderness.  Musculoskeletal:     Cervical back: Neck supple.  Lymphadenopathy:     Cervical: No cervical adenopathy.           Assessment & Plan:  Rhinosinusitis  Begin Augmentin 875 mg p.o. twice daily for 10 days.  Use over-the-counter decongestants to help with sinus pressure and pain.  Recheck in 1 week if no better or sooner if worse.

## 2020-02-12 ENCOUNTER — Ambulatory Visit: Payer: PRIVATE HEALTH INSURANCE | Admitting: Orthopaedic Surgery

## 2020-02-22 ENCOUNTER — Encounter: Payer: Self-pay | Admitting: Orthopaedic Surgery

## 2020-02-22 ENCOUNTER — Ambulatory Visit (INDEPENDENT_AMBULATORY_CARE_PROVIDER_SITE_OTHER): Payer: Medicare Other | Admitting: Orthopaedic Surgery

## 2020-02-22 ENCOUNTER — Ambulatory Visit: Payer: Self-pay

## 2020-02-22 ENCOUNTER — Other Ambulatory Visit: Payer: Self-pay

## 2020-02-22 VITALS — Ht 63.0 in

## 2020-02-22 DIAGNOSIS — M545 Low back pain, unspecified: Secondary | ICD-10-CM | POA: Diagnosis not present

## 2020-02-22 DIAGNOSIS — G8929 Other chronic pain: Secondary | ICD-10-CM | POA: Diagnosis not present

## 2020-02-22 NOTE — Progress Notes (Signed)
Office Visit Note   Patient: Chelsea Griffith           Date of Birth: 1955-08-01           MRN: [id number] Visit Date: 02/22/2020              Requested by: Susy Frizzle, MD [address] [facility] [place],  [place] [address] PCP: Susy Frizzle, MD   Assessment & Plan: Visit Diagnoses:  1. Chronic left-sided low back pain, unspecified whether sciatica present     Plan: We will set patient up for some therapy for her left-sided sacroiliac region pain.  Recheck 6 weeks.  Follow-Up Instructions: No follow-ups on file.   Orders:  Orders Placed This Encounter  Procedures  . XR Lumbar Spine 2-3 Views   No orders of the defined types were placed in this encounter.     Procedures: No procedures performed   Clinical Data: No additional findings.   Subjective: Chief Complaint  Patient presents with  . Lower Back - Pain    HPI 64 year old female seen with 62-month history of pain over the left SI joint.  Pain sometimes is stabbing she describes it as being shot with a nail done.  Other times pain is not quite as severe but last for 20 minutes.  Pain has been intermittent.  Aleve gave her some relief.  She had had multiple GYN surgeries including hysterectomy.  Patient denies fever chills.  No lower extremity weakness or numbness to her feet.  No previous lumbar surgeries.  She has not really noticed any progression in intensity just persistence now for 18 months.  She points directly to the sacroiliac joint region and states it feels like it is deep and when it hits she has to stop and is not able continue to be active.  Review of Systems previous rotator cuff surgery on the right.  Recurrent rotator cuff ultimately reverse total shoulder.  History of GERD stable.  Anxiety depression osteoporosis.   Objective: Vital Signs: Ht 5\' 3"  (1.6 m)   BMI 30.82 kg/m   Physical Exam Constitutional:      Appearance: She is well-developed.  HENT:     Head: Normocephalic.      Right Ear: External ear normal.     Left Ear: External ear normal.  Eyes:     Pupils: Pupils are equal, round, and reactive to light.  Neck:     Thyroid: No thyromegaly.     Trachea: No tracheal deviation.  Cardiovascular:     Rate and Rhythm: Normal rate.  Pulmonary:     Effort: Pulmonary effort is normal.  Abdominal:     Palpations: Abdomen is soft.  Skin:    General: Skin is warm and dry.  Neurological:     Mental Status: She is alert and oriented to person, place, and time.  Psychiatric:        Mood and Affect: Mood and affect normal.        Behavior: Behavior normal.     Ortho Exam patient has trace knee and ankle jerk but symmetrical.  Anterior tib EHL is normal.  Negative logroll to the hips.  Good hip flexion negative FABER test.  Left  SI joint palpation reproduces some of her symptoms.  No sciatic notch tenderness negative straight leg raising 90 degrees.  No lower extremity atrophy.  Distal pulses are 2+.  Specialty Comments:  No specialty comments available.  Imaging: XR Lumbar Spine 2-3 Views  Result Date: 02/22/2020 On AP lateral lumbar spine x-rays are obtained and reviewed.  This shows mild left lumbar curvature less than 15degrees.  Few millimeters pelvic obliquity.  Grade 1 L4-5 anterolisthesis.  Normal hip joints. Impression: Mild lumbar curvature with L4-5 degenerative anterolisthesis.    PMFS History: Patient Active Problem List   Diagnosis Date Noted  . Weakness with dizziness 12/15/2018  . Depression 12/15/2018  . Anxiety 12/15/2018  . Dizziness 12/15/2018  . Rotator cuff tear arthropathy, right 09/06/2018  . Osteoporosis 04/08/2014  . Elevated lipids 10/19/2013  . GERD (gastroesophageal reflux disease) 10/19/2013  . Chest pain 10/19/2013   Past Medical History:  Diagnosis Date  . Anxiety   . Arthritis    fingers and thumbs  . Asthma   . Depression   . GERD (gastroesophageal reflux disease)   . History of anemia   . Hyperlipidemia    . Osteoporosis    right hip  . Rotator cuff tear, right     Family History  Problem Relation Age of Onset  . Hyperlipidemia Mother   . ADD / ADHD Son   . Heart disease Maternal Grandmother   . Colon cancer Neg Hx   . Esophageal cancer Neg Hx   . Rectal cancer Neg Hx   . Stomach cancer Neg Hx     Past Surgical History:  Procedure Laterality Date  . ABDOMINAL HYSTERECTOMY    . CESAREAN SECTION      2 times  . COLONOSCOPY    . DILATION AND CURETTAGE OF UTERUS     several  . ELBOW SURGERY Bilateral     2times/1 time on left elbow  . KNEE SURGERY     right knee  . PARTIAL HYSTERECTOMY     2 times  . REVERSE SHOULDER ARTHROPLASTY Right 09/06/2018   Procedure: REVERSE SHOULDER ARTHROPLASTY;  Surgeon: Hiram Gash, MD;  Location: [facility];  Service: Orthopedics;  Laterality: Right;  . ROTATOR CUFF REPAIR     right shoulderx2    Social History   Occupational History  . Not on file  Tobacco Use  . Smoking status: Never Smoker  . Smokeless tobacco: Never Used  Vaping Use  . Vaping Use: Never used  Substance and Sexual Activity  . Alcohol use: Yes    Alcohol/week: 1.0 standard drink    Types: 1 Shots of liquor per week    Comment: occasional weekly drink  . Drug use: No  . Sexual activity: Yes    Comment: single

## 2020-02-22 NOTE — Addendum Note (Signed)
Addended by: Meyer Cory on: 02/22/2020 03:51 PM   Modules accepted: Orders

## 2020-02-27 ENCOUNTER — Ambulatory Visit: Payer: PRIVATE HEALTH INSURANCE | Admitting: Rehabilitative and Restorative Service Providers"

## 2020-03-13 ENCOUNTER — Ambulatory Visit (INDEPENDENT_AMBULATORY_CARE_PROVIDER_SITE_OTHER): Payer: Medicare Other | Admitting: Rehabilitative and Restorative Service Providers"

## 2020-03-13 ENCOUNTER — Encounter: Payer: Self-pay | Admitting: Rehabilitative and Restorative Service Providers"

## 2020-03-13 ENCOUNTER — Other Ambulatory Visit: Payer: Self-pay

## 2020-03-13 DIAGNOSIS — R262 Difficulty in walking, not elsewhere classified: Secondary | ICD-10-CM

## 2020-03-13 DIAGNOSIS — M545 Low back pain, unspecified: Secondary | ICD-10-CM

## 2020-03-13 DIAGNOSIS — G8929 Other chronic pain: Secondary | ICD-10-CM | POA: Diagnosis not present

## 2020-03-13 DIAGNOSIS — R293 Abnormal posture: Secondary | ICD-10-CM | POA: Diagnosis not present

## 2020-03-13 DIAGNOSIS — M6281 Muscle weakness (generalized): Secondary | ICD-10-CM

## 2020-03-13 NOTE — Therapy (Signed)
[facility] Therapy [address] [place], [place], [address] Phone: [phone]   Fax:  [phone]  Physical Therapy Evaluation  Patient Details  Name: Chelsea Griffith MRN: [id number] Date of Birth: Apr 24, 1955 Referring Provider (PT): Marybelle Killings MD   Encounter Date: 03/13/2020   PT End of Session - 03/13/20 1333    Visit Number 1    Number of Visits 16    PT Start Time [id number]    PT Stop Time 1225    PT Time Calculation (min) 40 min    Activity Tolerance Patient tolerated treatment well;No increased pain    Behavior During Therapy WFL for tasks assessed/performed           Past Medical History:  Diagnosis Date  . Anxiety   . Arthritis    fingers and thumbs  . Asthma   . Depression   . GERD (gastroesophageal reflux disease)   . History of anemia   . Hyperlipidemia   . Osteoporosis    right hip  . Rotator cuff tear, right     Past Surgical History:  Procedure Laterality Date  . ABDOMINAL HYSTERECTOMY    . CESAREAN SECTION      2 times  . COLONOSCOPY    . DILATION AND CURETTAGE OF UTERUS     several  . ELBOW SURGERY Bilateral     2times/1 time on left elbow  . KNEE SURGERY     right knee  . PARTIAL HYSTERECTOMY     2 times  . REVERSE SHOULDER ARTHROPLASTY Right 09/06/2018   Procedure: REVERSE SHOULDER ARTHROPLASTY;  Surgeon: Hiram Gash, MD;  Location: [facility];  Service: Orthopedics;  Laterality: Right;  . ROTATOR CUFF REPAIR     right shoulderx2     There were no vitals filed for this visit.    Subjective Assessment - 03/13/20 1327    Subjective Chelsea Griffith has had low back pain for a year and a half.  She is frustrated by the lack of progress and is looking to return to working with her horses without pain.    Pertinent History Previous B elbow surgeries (3) and R reverse shoulder replacement    Limitations Sitting;House hold activities;Lifting;Walking    Patient Stated Goals Be able to work with her horses without pain or  restrictions due to low back pain    Currently in Pain? Yes    Pain Score 5     Pain Location Back    Pain Orientation Lower;Left    Pain Descriptors / Indicators Aching;Sharp    Pain Type Chronic pain    Pain Radiating Towards NA    Pain Onset More than a month ago    Pain Frequency Constant    Aggravating Factors  Flexion, walking and prolonged activities    Pain Relieving Factors None    Effect of Pain on Daily Activities Limits endurance and comfort with activities involving her horses and ADLs    Multiple Pain Sites No              [facility] PT Assessment - 03/13/20 0001      Assessment   Medical Diagnosis Chronic L sided low back pain    Referring Provider (PT) Marybelle Killings MD    Onset Date/Surgical Date --   1.5 year duration     Balance Screen   Has the patient fallen in the past 6 months No    Has the patient had a decrease in activity level because  of a fear of falling?  No    Is the patient reluctant to leave their home because of a fear of falling?  No      Prior Function   Level of Independence Independent      Cognition   Overall Cognitive Status Within Functional Limits for tasks assessed      Observation/Other Assessments   Focus on Therapeutic Outcomes (FOTO)  54 (Goal 63)      Posture/Postural Control   Posture/Postural Control Postural limitations    Postural Limitations Forward head;Rounded Shoulders;Decreased lumbar lordosis      ROM / Strength   AROM / PROM / Strength AROM;Strength      AROM   Overall AROM  Deficits    AROM Assessment Site Lumbar;Hip    Right/Left Hip Left;Right    Right Hip Flexion 110    Right Hip External Rotation  30    Right Hip Internal Rotation  15    Left Hip Flexion 110    Left Hip External Rotation  40    Left Hip Internal Rotation  10    Lumbar Extension 0      Strength   Overall Strength Deficits    Strength Assessment Site Lumbar    Lumbar Extension --   14 seconds modified spine strength test (30-60  seconds Goal for regular spine strength test, at least 60 seconds Goal for modified test)     Flexibility   Soft Tissue Assessment /Muscle Length yes    Hamstrings 45 degrees/45 degrees                      Objective measurements completed on examination: See above findings.       [facility] Adult PT Treatment/Exercise - 03/13/20 0001      Therapeutic Activites    Therapeutic Activities ADL's    ADL's Log roll, postural and body mechanics education, anatomy and imaging review, review of exam findings and started HEP including walking prescription.      Exercises   Exercises Lumbar      Lumbar Exercises: Stretches   Figure 4 Stretch 5 reps;20 seconds      Lumbar Exercises: Standing   Shoulder ADduction Strengthening;Both;10 reps    Shoulder Adduction Limitations 5 seconds shoulder blade pinches    Other Standing Lumbar Exercises Standing trunk extension AROM (hips forward) 10X 3 seconds      Lumbar Exercises: Prone   Straight Leg Raise 10 reps;3 seconds                  PT Education - 03/13/20 1332    Education Details Reviewed imaging and today's exam findings.  Went over posture, log roll technique and basic spine anatomy and body mechanics.  Reviewed starter HEP.    Person(s) Educated Patient    Methods Explanation;Demonstration;Verbal cues;Tactile cues;Handout    Comprehension Verbal cues required;Returned demonstration;Need further instruction;Verbalized understanding;Tactile cues required               PT Long Term Goals - 03/13/20 1338      PT LONG TERM GOAL #1   Title Improve FOTO score to 63    Baseline 54    Time 8    Period Weeks    Status New    Target Date 06/05/20      PT LONG TERM GOAL #2   Title Improve trunk extension AROM to at least 10 degrees.    Baseline 0    Time 8  Period Weeks    Status New    Target Date 06/05/20      PT LONG TERM GOAL #3   Title Improve B hip ER AROM to 40 degrees.    Baseline 30/40 degrees     Time 8    Period Weeks    Status New    Target Date 06/05/20      PT LONG TERM GOAL #4   Title Improve spine strength to 60 seconds with modified spine strength test.    Baseline 14 seconds    Time 8    Period Weeks    Status New    Target Date 06/05/20      PT LONG TERM GOAL #5   Title Improve low back pain to consistently 0-4/10 on the Numeric Pain Rating Scale.    Baseline Can be briefly 10/10    Time 8    Period Weeks    Status New    Target Date 06/05/20      Additional Long Term Goals   Additional Long Term Goals Yes      PT LONG TERM GOAL #6   Title Chelsea Griffith will be independent with her long-term HEP at [place].    Time 8    Period Weeks    Status New    Target Date 06/05/20                  Plan - 03/13/20 1333    Clinical Impression Statement Chelsea Griffith has a 1.5 year history of low back pain.  Symptoms are typically achy but can be a brief, sharp 10/10 stabbing pain.  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  Flexion activities (cleaning up after horses) are particularly limited.  Core strengthening and posture/body mechanics work will be the focus of Chelsea Griffith's physical therapy.  Her prognosis is good to meet LTGs.    Personal Factors and Comorbidities Comorbidity 1    Comorbidities Previous multiple B elbow and R shoulder surgeries including reverse R shoulder replacement in 2020    Examination-Activity Limitations Sit;Sleep;Bed Mobility;Bend;Lift;Squat;Locomotion Level;Caring for Others;Carry;Stand    Examination-Participation Restrictions Occupation;Cleaning;Yard Work;Community Activity;Shop    Stability/Clinical Decision Making Stable/Uncomplicated    Clinical Decision Making Low    Rehab Potential Good    PT Frequency 2x / week    PT Duration 8 weeks    PT Treatment/Interventions ADLs/Self Care Home Management;Moist Heat;Cryotherapy;Traction;Therapeutic activities;Therapeutic exercise;Neuromuscular  re-education;Patient/family education;Manual techniques;Dry needling    PT Next Visit Plan Postural and body mechanics work along with core strength progressions (avoiding flexion)    PT Home Exercise Plan Access Code: [email or web address]    Consulted and Agree with Plan of Care Patient           Patient will benefit from skilled therapeutic intervention in order to improve the following deficits and impairments:  Abnormal gait,Decreased activity tolerance,Decreased endurance,Decreased range of motion,Decreased mobility,Decreased strength,Difficulty walking,Hypomobility,Impaired flexibility,Increased muscle spasms,Postural dysfunction,Improper body mechanics,Pain  Visit Diagnosis: Abnormal posture  Difficulty walking  Chronic left-sided low back pain without sciatica  Muscle weakness (generalized)     Problem List Patient Active Problem List   Diagnosis Date Noted  . Weakness with dizziness 12/15/2018  . Depression 12/15/2018  . Anxiety 12/15/2018  . Dizziness 12/15/2018  . Rotator cuff tear arthropathy, right 09/06/2018  . Osteoporosis 04/08/2014  . Elevated lipids 10/19/2013  . GERD (gastroesophageal reflux disease) 10/19/2013  . Chest pain 10/19/2013    Farley Ly PT, MPT  03/13/2020, 1:43 PM  [facility] Physical Therapy [address] [place], [place], [address] Phone: [phone]   Fax:  [phone]  Name: Chelsea Griffith MRN: [id number] Date of Birth: [birth year]

## 2020-03-13 NOTE — Patient Instructions (Signed)
Access Code: [email or web address] URL: [email or web address] Date: 03/13/2020 Prepared by: Pauletta Browns  Exercises Standing Lumbar Extension at Wall - Forearms - 5 x daily - 7 x weekly - 1 sets - 5 reps - 3 seconds hold Standing Scapular Retraction - 5 x daily - 7 x weekly - 1 sets - 5 reps - 5 second hold Supine Figure 4 Piriformis Stretch - 2 x daily - 7 x weekly - 1 sets - 5 reps - 20 seconds hold Prone Hip Extension - 1-2 x daily - 7 x weekly - 1-2 sets - 10 reps - 3 seconds hold

## 2020-03-17 ENCOUNTER — Encounter: Payer: PRIVATE HEALTH INSURANCE | Admitting: Physical Therapy

## 2020-03-24 ENCOUNTER — Encounter: Payer: PRIVATE HEALTH INSURANCE | Admitting: Physical Therapy

## 2020-03-26 ENCOUNTER — Ambulatory Visit
Admission: RE | Admit: 2020-03-26 | Discharge: 2020-03-26 | Disposition: A | Discharge status: Home / Self Care | Payer: Medicare Other | Source: Ambulatory Visit | Attending: Family Medicine | Admitting: Family Medicine

## 2020-03-26 ENCOUNTER — Other Ambulatory Visit: Payer: Self-pay

## 2020-03-26 DIAGNOSIS — Z0000 Encounter for general adult medical examination without abnormal findings: Secondary | ICD-10-CM

## 2020-03-26 DIAGNOSIS — Z1231 Encounter for screening mammogram for malignant neoplasm of breast: Secondary | ICD-10-CM | POA: Diagnosis not present

## 2020-03-27 ENCOUNTER — Ambulatory Visit (INDEPENDENT_AMBULATORY_CARE_PROVIDER_SITE_OTHER): Payer: Medicare Other | Admitting: Rehabilitative and Restorative Service Providers"

## 2020-03-27 ENCOUNTER — Encounter: Payer: Self-pay | Admitting: Rehabilitative and Restorative Service Providers"

## 2020-03-27 DIAGNOSIS — R293 Abnormal posture: Secondary | ICD-10-CM

## 2020-03-27 DIAGNOSIS — R262 Difficulty in walking, not elsewhere classified: Secondary | ICD-10-CM

## 2020-03-27 DIAGNOSIS — M545 Low back pain, unspecified: Secondary | ICD-10-CM | POA: Diagnosis not present

## 2020-03-27 DIAGNOSIS — G8929 Other chronic pain: Secondary | ICD-10-CM

## 2020-03-27 DIAGNOSIS — M6281 Muscle weakness (generalized): Secondary | ICD-10-CM | POA: Diagnosis not present

## 2020-03-27 NOTE — Patient Instructions (Signed)
Access Code: [email or web address] URL: [email or web address] Date: 03/27/2020 Prepared by: Vista Mink  Exercises Standing Lumbar Extension at [place] 5 x daily - 7 x weekly - 1 sets - 5 reps - 3 seconds hold Standing Scapular Retraction - 5 x daily - 7 x weekly - 1 sets - 5 reps - 5 second hold Supine Figure 4 Piriformis Stretch - 2 x daily - 7 x weekly - 1 sets - 5 reps - 20 seconds hold Prone Hip Extension - 1-2 x daily - 7 x weekly - 1-2 sets - 10 reps - 3 seconds hold Standing Hip Hiking - 2 x daily - 7 x weekly - 2 sets - 10 reps - 3 seconds hold

## 2020-03-27 NOTE — Therapy (Signed)
[facility] Physical Therapy [address] [place], [place], [address] Phone: [phone]   Fax:  [phone]  Physical Therapy Treatment  Patient Details  Name: Chelsea Griffith MRN: [id number] Date of Birth: 05-Apr-1955 Referring Provider (PT): Marybelle Killings MD   Encounter Date: 03/27/2020   PT End of Session - 03/27/20 1723    Visit Number 2    Number of Visits 16    PT Start Time 0936    PT Stop Time 8676    PT Time Calculation (min) 39 min    Activity Tolerance Patient tolerated treatment well;No increased pain    Behavior During Therapy WFL for tasks assessed/performed           Past Medical History:  Diagnosis Date  . Anxiety   . Arthritis    fingers and thumbs  . Asthma   . Depression   . GERD (gastroesophageal reflux disease)   . History of anemia   . Hyperlipidemia   . Osteoporosis    right hip  . Rotator cuff tear, right     Past Surgical History:  Procedure Laterality Date  . ABDOMINAL HYSTERECTOMY    . CESAREAN SECTION      2 times  . COLONOSCOPY    . DILATION AND CURETTAGE OF UTERUS     several  . ELBOW SURGERY Bilateral     2times/1 time on left elbow  . KNEE SURGERY     right knee  . PARTIAL HYSTERECTOMY     2 times  . REVERSE SHOULDER ARTHROPLASTY Right 09/06/2018   Procedure: REVERSE SHOULDER ARTHROPLASTY;  Surgeon: Hiram Gash, MD;  Location: [facility];  Service: Orthopedics;  Laterality: Right;  . ROTATOR CUFF REPAIR     right shoulderx2     There were no vitals filed for this visit.   Subjective Assessment - 03/27/20 1721    Subjective Mercadez had to go to [place] to help care for her sick mom.  HEP compliance suffered.    Pertinent History Previous B elbow surgeries (3) and R reverse shoulder replacement    Limitations Sitting;House hold activities;Lifting;Walking    Patient Stated Goals Be able to work with her horses without pain or restrictions due to low back pain    Currently in Pain? Yes    Pain Score 5      Pain Location Back    Pain Orientation Left;Lower    Pain Descriptors / Indicators Aching;Sharp    Pain Type Chronic pain    Pain Onset More than a month ago    Pain Frequency Constant    Multiple Pain Sites No                             [facility] Adult PT Treatment/Exercise - 03/27/20 0001      Posture/Postural Control   Posture/Postural Control Postural limitations    Postural Limitations Forward head;Rounded Shoulders;Decreased lumbar lordosis      Therapeutic Activites    Therapeutic Activities ADL's    ADL's Log roll, postural and body mechanics education (golfer's lift), anatomy, sleeping postures (lumbar pillow when supine) and review of HEP including walking prescription.      Exercises   Exercises Lumbar      Lumbar Exercises: Stretches   Figure 4 Stretch 5 reps;20 seconds      Lumbar Exercises: Standing   Shoulder ADduction Strengthening;Both;10 reps    Shoulder Adduction Limitations 5 seconds shoulder blade pinches  Other Standing Lumbar Exercises Standing trunk extension AROM (hips forward) 10X 3 seconds    Other Standing Lumbar Exercises Hip hike 2 sets of 10 for 3 seconds (tried heel to toe raises with pelvic stabilization-too easy & hip abduction with pelvic stabilization-too difficult)      Lumbar Exercises: Prone   Straight Leg Raise 10 reps;3 seconds;Other (comment)    Straight Leg Raises Limitations 2 sets prone alternating hip extensions                  PT Education - 03/27/20 1722    Education Details Discussed body mechanics and ideas to improve sleep on her back.  Reviewed and updated HEP.    Person(s) Educated Patient    Methods Explanation;Demonstration;Verbal cues;Handout    Comprehension Verbal cues required;Need further instruction;Returned demonstration;Verbalized understanding               PT Long Term Goals - 03/27/20 1722      PT LONG TERM GOAL #1   Title Improve FOTO score to 63    Baseline 54     Time 8    Period Weeks    Status On-going      PT LONG TERM GOAL #2   Title Improve trunk extension AROM to at least 10 degrees.    Baseline 0    Time 8    Period Weeks    Status On-going      PT LONG TERM GOAL #3   Title Improve B hip ER AROM to 40 degrees.    Baseline 30/40 degrees    Time 8    Period Weeks    Status On-going      PT LONG TERM GOAL #4   Title Improve spine strength to 60 seconds with modified spine strength test.    Baseline 14 seconds    Time 8    Period Weeks    Status On-going      PT LONG TERM GOAL #5   Title Improve low back pain to consistently 0-4/10 on the Numeric Pain Rating Scale.    Baseline Can be briefly 10/10    Time 8    Period Weeks    Status On-going      PT LONG TERM GOAL #6   Title Piya will be independent with her long-term HEP at [place].    Time 8    Period Weeks    Status On-going                 Plan - 03/27/20 1723    Clinical Impression Statement Early compliance with her HEP was hindered by a trip to [place] to care for her mom.  Darlys had her strengthening progressed and we addressed functional concerns with sleep.  Increased compliance and appropriate strength and posture/body mechanics progressions will help Tifany meet long-term goals.    Personal Factors and Comorbidities Comorbidity 1    Comorbidities Previous multiple B elbow and R shoulder surgeries including reverse R shoulder replacement in 2020    Examination-Activity Limitations Sit;Sleep;Bed Mobility;Bend;Lift;Squat;Locomotion Level;Caring for Others;Carry;Stand    Examination-Participation Restrictions Occupation;Cleaning;Yard Work;Community Activity;Shop    Stability/Clinical Decision Making Stable/Uncomplicated    Rehab Potential Good    PT Frequency 2x / week    PT Duration 8 weeks    PT Treatment/Interventions ADLs/Self Care Home Management;Moist Heat;Cryotherapy;Traction;Therapeutic activities;Therapeutic exercise;Neuromuscular  re-education;Patient/family education;Manual techniques;Dry needling    PT Next Visit Plan Postural and body mechanics work along with core strength progressions (avoiding flexion)  PT Home Exercise Plan Access Code: [email or web address] and Agree with Plan of Care Patient           Patient will benefit from skilled therapeutic intervention in order to improve the following deficits and impairments:  Abnormal gait,Decreased activity tolerance,Decreased endurance,Decreased range of motion,Decreased mobility,Decreased strength,Difficulty walking,Hypomobility,Impaired flexibility,Increased muscle spasms,Postural dysfunction,Improper body mechanics,Pain  Visit Diagnosis: Abnormal posture  Difficulty walking  Chronic left-sided low back pain without sciatica  Muscle weakness (generalized)     Problem List Patient Active Problem List   Diagnosis Date Noted  . Weakness with dizziness 12/15/2018  . Depression 12/15/2018  . Anxiety 12/15/2018  . Dizziness 12/15/2018  . Rotator cuff tear arthropathy, right 09/06/2018  . Osteoporosis 04/08/2014  . Elevated lipids 10/19/2013  . GERD (gastroesophageal reflux disease) 10/19/2013  . Chest pain 10/19/2013    Farley Ly PT, MPT 03/27/2020, 5:25 PM  [facility] Physical Therapy [address] [place], [place], [address] Phone: [phone]   Fax:  [phone]  Name: Syanne Hamel MRN: [id number] Date of Birth: [birth year]

## 2020-04-02 ENCOUNTER — Other Ambulatory Visit: Payer: Self-pay

## 2020-04-02 ENCOUNTER — Ambulatory Visit (INDEPENDENT_AMBULATORY_CARE_PROVIDER_SITE_OTHER): Payer: Medicare Other | Admitting: Physical Therapy

## 2020-04-02 ENCOUNTER — Encounter: Payer: Self-pay | Admitting: Orthopaedic Surgery

## 2020-04-02 ENCOUNTER — Encounter: Payer: Self-pay | Admitting: Physical Therapy

## 2020-04-02 ENCOUNTER — Ambulatory Visit (INDEPENDENT_AMBULATORY_CARE_PROVIDER_SITE_OTHER): Payer: Medicare Other | Admitting: Orthopaedic Surgery

## 2020-04-02 DIAGNOSIS — G8929 Other chronic pain: Secondary | ICD-10-CM | POA: Diagnosis not present

## 2020-04-02 DIAGNOSIS — M5442 Lumbago with sciatica, left side: Secondary | ICD-10-CM | POA: Diagnosis not present

## 2020-04-02 DIAGNOSIS — M545 Low back pain, unspecified: Secondary | ICD-10-CM | POA: Diagnosis not present

## 2020-04-02 DIAGNOSIS — R293 Abnormal posture: Secondary | ICD-10-CM

## 2020-04-02 DIAGNOSIS — R262 Difficulty in walking, not elsewhere classified: Secondary | ICD-10-CM | POA: Diagnosis not present

## 2020-04-02 DIAGNOSIS — M6281 Muscle weakness (generalized): Secondary | ICD-10-CM

## 2020-04-02 NOTE — Therapy (Addendum)
[facility] Physical Therapy [address] [place], [place], [address] Phone: [phone]   Fax:  [phone]  Physical Therapy Treatment Discharge  Patient Details  Name: Chelsea Griffith MRN: [id number] Date of Birth: 1956-01-10 Referring Provider (PT): Marybelle Killings MD   Encounter Date: 04/02/2020   PT End of Session - 04/02/20 0958    Visit Number 3    Number of Visits 16    PT Start Time 0930    PT Stop Time 1009    PT Time Calculation (min) 39 min    Activity Tolerance Patient tolerated treatment well;No increased pain    Behavior During Therapy WFL for tasks assessed/performed           Past Medical History:  Diagnosis Date  . Anxiety   . Arthritis    fingers and thumbs  . Asthma   . Depression   . GERD (gastroesophageal reflux disease)   . History of anemia   . Hyperlipidemia   . Osteoporosis    right hip  . Rotator cuff tear, right     Past Surgical History:  Procedure Laterality Date  . ABDOMINAL HYSTERECTOMY    . CESAREAN SECTION      2 times  . COLONOSCOPY    . DILATION AND CURETTAGE OF UTERUS     several  . ELBOW SURGERY Bilateral     2times/1 time on left elbow  . KNEE SURGERY     right knee  . PARTIAL HYSTERECTOMY     2 times  . REVERSE SHOULDER ARTHROPLASTY Right 09/06/2018   Procedure: REVERSE SHOULDER ARTHROPLASTY;  Surgeon: Hiram Gash, MD;  Location: [facility];  Service: Orthopedics;  Laterality: Right;  . ROTATOR CUFF REPAIR     right shoulderx2     There were no vitals filed for this visit.   Subjective Assessment - 04/02/20 0957    Subjective Pt arriving to therapy reporting no pain.    Pertinent History Previous B elbow surgeries (3) and R reverse shoulder replacement    Limitations Sitting;House hold activities;Lifting;Walking    Patient Stated Goals Be able to work with her horses without pain or restrictions due to low back pain    Currently in Pain? No/denies    Pain Orientation Lower;Right    Pain  Descriptors / Indicators Aching    Pain Type Chronic pain    Pain Onset More than a month ago    Pain Frequency Constant                             [facility] Adult PT Treatment/Exercise - 04/02/20 0001      Lumbar Exercises: Standing   Row Strengthening;Both;20 reps;Theraband    Theraband Level (Row) Level 3 (Green)    Shoulder Adduction Limitations 5 second shoulder blade pinches x 10    Other Standing Lumbar Exercises standing trunk extension with elbows on the wall x 10 holding 10 seconds each      Lumbar Exercises: Prone   Straight Leg Raise 10 reps;3 seconds;Other (comment)    Straight Leg Raises Limitations 2 sets prone alternating hip extensions      Manual Therapy   Manual therapy comments 15 minutes: IASTM to lumbar paraspinals, superior SI joint and piriformis                       PT Long Term Goals - 03/27/20 1722      PT  LONG TERM GOAL #1   Title Improve FOTO score to 63    Baseline 54    Time 8    Period Weeks    Status On-going      PT LONG TERM GOAL #2   Title Improve trunk extension AROM to at least 10 degrees.    Baseline 0    Time 8    Period Weeks    Status On-going      PT LONG TERM GOAL #3   Title Improve B hip ER AROM to 40 degrees.    Baseline 30/40 degrees    Time 8    Period Weeks    Status On-going      PT LONG TERM GOAL #4   Title Improve spine strength to 60 seconds with modified spine strength test.    Baseline 14 seconds    Time 8    Period Weeks    Status On-going      PT LONG TERM GOAL #5   Title Improve low back pain to consistently 0-4/10 on the Numeric Pain Rating Scale.    Baseline Can be briefly 10/10    Time 8    Period Weeks    Status On-going      PT LONG TERM GOAL #6   Title Chelsea Griffith will be independent with her long-term HEP at [place].    Time 8    Period Weeks    Status On-going                 Plan - 04/02/20 1000    Clinical Impression Statement Pt with good response to STM  using IASTM and percussion. Pt feels like her HEP are helping. Concentration on strengthening with eccentric control, posture correction and decreasing pain. Continue skilled PT.    Comorbidities Previous multiple B elbow and R shoulder surgeries including reverse R shoulder replacement in 2020    Examination-Activity Limitations Sit;Sleep;Bed Mobility;Bend;Lift;Squat;Locomotion Level;Caring for Others;Carry;Stand    Examination-Participation Restrictions Occupation;Cleaning;Yard Work;Community Activity;Shop    Stability/Clinical Decision Making Stable/Uncomplicated    Rehab Potential Good    PT Frequency 2x / week    PT Duration 8 weeks    PT Treatment/Interventions ADLs/Self Care Home Management;Moist Heat;Cryotherapy;Traction;Therapeutic activities;Therapeutic exercise;Neuromuscular re-education;Patient/family education;Manual techniques;Dry needling    PT Next Visit Plan Postural and body mechanics work along with core strength progressions (avoiding flexion)    PT Home Exercise Plan Access Code: [email or web address]    Consulted and Agree with Plan of Care Patient           Patient will benefit from skilled therapeutic intervention in order to improve the following deficits and impairments:  Abnormal gait,Decreased activity tolerance,Decreased endurance,Decreased range of motion,Decreased mobility,Decreased strength,Difficulty walking,Hypomobility,Impaired flexibility,Increased muscle spasms,Postural dysfunction,Improper body mechanics,Pain  Visit Diagnosis: Abnormal posture  Difficulty walking  Chronic left-sided low back pain without sciatica  Muscle weakness (generalized)  PHYSICAL THERAPY DISCHARGE SUMMARY  Visits from Start of Care: 3  Current functional level related to goals / functional outcomes: See above   Remaining deficits: See above   Education / Equipment: HEP Plan: Patient agrees to discharge.  Patient goals were not met. Patient is being discharged due to not  returning since the last visit.  ?????        Problem List Patient Active Problem List   Diagnosis Date Noted  . Weakness with dizziness 12/15/2018  . Depression 12/15/2018  . Anxiety 12/15/2018  . Dizziness 12/15/2018  . Rotator cuff tear arthropathy, right 09/06/2018  . Osteoporosis  04/08/2014  . Elevated lipids 10/19/2013  . GERD (gastroesophageal reflux disease) 10/19/2013  . Chest pain 10/19/2013    Oretha Caprice, PT, MPT 04/02/2020, 10:07 AM  [facility] Physical Therapy [address] [place], [place], [address] Phone: [phone]   Fax:  [phone]  Name: Chelsea Griffith MRN: [id number] Date of Birth: [birth year]

## 2020-04-04 ENCOUNTER — Ambulatory Visit: Payer: PRIVATE HEALTH INSURANCE | Admitting: Orthopaedic Surgery

## 2020-04-04 DIAGNOSIS — M545 Low back pain, unspecified: Secondary | ICD-10-CM | POA: Insufficient documentation

## 2020-04-04 NOTE — Progress Notes (Signed)
Office Visit Note   Patient: Chelsea Griffith           Date of Birth: 05/05/1955           MRN: [id number] Visit Date: 04/02/2020              Requested by: Susy Frizzle, MD [id number] [facility] [address],  [place] [address] PCP: Susy Frizzle, MD   Assessment & Plan: Visit Diagnoses: No diagnosis found.  Plan: Patient has grade 1 anterolisthesis at L4-5.  We discussed options which could be decompression versus decompression and fusion.  At present her symptomsAre 50% improved.  She gets increased symptoms would like to return to discuss this further she can call make an appointment.  We reviewed x-rays today pathophysiology discussed.  Fortunately she has got some improvement likely has had some decreased and the disc protrusion.  Follow-Up Instructions: No follow-ups on file.   Orders:  No orders of the defined types were placed in this encounter.  No orders of the defined types were placed in this encounter.     Procedures: No procedures performed   Clinical Data: No additional findings.   Subjective: Chief Complaint  Patient presents with  . Lower Back - Pain    HPI 65 year old female returns for follow-up with back pain left buttocks pain.  She states she is gotten about 50% improvement since her visit 02/22/2020.  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  This involves bending some turning twisting lifting etc.  No bowel or bladder symptoms she has been amatory no claudication symptoms.  Review of Systems 14 point update unchanged from 02/22/2020 office visit.   Objective: Vital Signs: BP 112/77   Pulse 64   Physical Exam Constitutional:      Appearance: She is well-developed.  HENT:     Head: Normocephalic.     Right Ear: External ear normal.     Left Ear: External ear normal.  Eyes:     Pupils: Pupils are equal, round, and reactive to light.  Neck:     Thyroid: No thyromegaly.     Trachea: No tracheal deviation.   Cardiovascular:     Rate and Rhythm: Normal rate.  Pulmonary:     Effort: Pulmonary effort is normal.  Abdominal:     Palpations: Abdomen is soft.  Skin:    General: Skin is warm and dry.  Neurological:     Mental Status: She is alert and oriented to person, place, and time.  Psychiatric:        Mood and Affect: Mood and affect normal.        Behavior: Behavior normal.     Ortho Exam patient has normal heel toe gait she moves from sitting to standing.  Still has some sciatic notch tenderness.  Negative logroll to the hips.  Distal pulses are intact no cellulitis.  Specialty Comments:  No specialty comments available.  Imaging: No results found.   PMFS History: Patient Active Problem List   Diagnosis Date Noted  . Weakness with dizziness 12/15/2018  . Depression 12/15/2018  . Anxiety 12/15/2018  . Dizziness 12/15/2018  . Rotator cuff tear arthropathy, right 09/06/2018  . Osteoporosis 04/08/2014  . Elevated lipids 10/19/2013  . GERD (gastroesophageal reflux disease) 10/19/2013  . Chest pain 10/19/2013   Past Medical History:  Diagnosis Date  . Anxiety   . Arthritis    fingers and thumbs  . Asthma   .  Depression   . GERD (gastroesophageal reflux disease)   . History of anemia   . Hyperlipidemia   . Osteoporosis    right hip  . Rotator cuff tear, right     Family History  Problem Relation Age of Onset  . Hyperlipidemia Mother   . ADD / ADHD Son   . Heart disease Maternal Grandmother   . Colon cancer Neg Hx   . Esophageal cancer Neg Hx   . Rectal cancer Neg Hx   . Stomach cancer Neg Hx     Past Surgical History:  Procedure Laterality Date  . ABDOMINAL HYSTERECTOMY    . CESAREAN SECTION      2 times  . COLONOSCOPY    . DILATION AND CURETTAGE OF UTERUS     several  . ELBOW SURGERY Bilateral     2times/1 time on left elbow  . KNEE SURGERY     right knee  . PARTIAL HYSTERECTOMY     2 times  . REVERSE SHOULDER ARTHROPLASTY Right 09/06/2018    Procedure: REVERSE SHOULDER ARTHROPLASTY;  Surgeon: Hiram Gash, MD;  Location: [facility];  Service: Orthopedics;  Laterality: Right;  . ROTATOR CUFF REPAIR     right shoulderx2    Social History   Occupational History  . Not on file  Tobacco Use  . Smoking status: Never Smoker  . Smokeless tobacco: Never Used  Vaping Use  . Vaping Use: Never used  Substance and Sexual Activity  . Alcohol use: Yes    Alcohol/week: 1.0 standard drink    Types: 1 Shots of liquor per week    Comment: occasional weekly drink  . Drug use: No  . Sexual activity: Yes    Comment: single

## 2020-04-14 ENCOUNTER — Other Ambulatory Visit: Payer: Self-pay | Admitting: Family Medicine

## 2020-05-07 ENCOUNTER — Other Ambulatory Visit: Payer: Self-pay | Admitting: Family Medicine

## 2020-05-07 DIAGNOSIS — J45901 Unspecified asthma with (acute) exacerbation: Secondary | ICD-10-CM

## 2020-05-07 DIAGNOSIS — J069 Acute upper respiratory infection, unspecified: Secondary | ICD-10-CM

## 2020-05-13 ENCOUNTER — Other Ambulatory Visit: Payer: Self-pay | Admitting: Family Medicine

## 2020-07-17 DIAGNOSIS — H353131 Nonexudative age-related macular degeneration, bilateral, early dry stage: Secondary | ICD-10-CM | POA: Diagnosis not present

## 2020-07-21 ENCOUNTER — Telehealth: Payer: Self-pay | Admitting: Family Medicine

## 2020-07-21 NOTE — Telephone Encounter (Signed)
Left message for patient to call back and schedule Medicare Annual Wellness Visit (AWV) in office.   If not able to come in office, please offer to do virtually or by telephone.   No history of  AWV: Per [employer] eligible for AWVI as of  04/08/2020  Please schedule at anytime with [facility] Advisor.  If any questions, please contact me at [phone]

## 2020-09-18 ENCOUNTER — Other Ambulatory Visit: Payer: Self-pay | Admitting: Family Medicine

## 2020-11-14 ENCOUNTER — Telehealth: Payer: Self-pay | Admitting: Family Medicine

## 2020-11-14 NOTE — Telephone Encounter (Signed)
Patient scheduled for cpe on 10/7; called to ask if due for pneumonia injection or any others. If due, she'd like to receive them at her appointment. Please advise at [phone].

## 2020-11-14 NOTE — Telephone Encounter (Signed)
Will discuss at CPE.

## 2020-12-09 ENCOUNTER — Other Ambulatory Visit: Payer: Self-pay

## 2020-12-09 DIAGNOSIS — Z136 Encounter for screening for cardiovascular disorders: Secondary | ICD-10-CM | POA: Diagnosis not present

## 2020-12-09 DIAGNOSIS — M810 Age-related osteoporosis without current pathological fracture: Secondary | ICD-10-CM

## 2020-12-09 DIAGNOSIS — H8110 Benign paroxysmal vertigo, unspecified ear: Secondary | ICD-10-CM

## 2020-12-09 DIAGNOSIS — E785 Hyperlipidemia, unspecified: Secondary | ICD-10-CM

## 2020-12-09 DIAGNOSIS — Z0000 Encounter for general adult medical examination without abnormal findings: Secondary | ICD-10-CM

## 2020-12-09 DIAGNOSIS — E559 Vitamin D deficiency, unspecified: Secondary | ICD-10-CM

## 2020-12-09 DIAGNOSIS — Z13220 Encounter for screening for lipoid disorders: Secondary | ICD-10-CM

## 2020-12-10 LAB — CBC WITH DIFFERENTIAL/PLATELET
Absolute Monocytes: 378 cells/uL (ref 200–950)
Basophils Absolute: 11 cells/uL (ref 0–200)
Basophils Relative: 0.2 %
Eosinophils Absolute: 81 cells/uL (ref 15–500)
Eosinophils Relative: 1.5 %
HCT: 42 % (ref 35.0–45.0)
Hemoglobin: 13.9 g/dL (ref 11.7–15.5)
Lymphs Abs: 1777 cells/uL (ref 850–3900)
MCH: 29.1 pg (ref 27.0–33.0)
MCHC: 33.1 g/dL (ref 32.0–36.0)
MCV: 87.9 fL (ref 80.0–100.0)
MPV: 10 fL (ref 7.5–12.5)
Monocytes Relative: 7 %
Neutro Abs: 3154 cells/uL (ref 1500–7800)
Neutrophils Relative %: 58.4 %
Platelets: 176 10*3/uL (ref 140–400)
RBC: 4.78 10*6/uL (ref 3.80–5.10)
RDW: 13.1 % (ref 11.0–15.0)
Total Lymphocyte: 32.9 %
WBC: 5.4 10*3/uL (ref 3.8–10.8)

## 2020-12-10 LAB — LIPID PANEL
Cholesterol: 180 mg/dL (ref <200)
HDL: 60 mg/dL (ref >=50)
LDL Cholesterol (Calc): 99 mg/dL (calc)
Non-HDL Cholesterol (Calc): 120 mg/dL (calc) (ref <130)
Total CHOL/HDL Ratio: 3 (calc) (ref <5.0)
Triglycerides: 118 mg/dL (ref <150)

## 2020-12-10 LAB — COMPLETE METABOLIC PANEL WITH GFR
AG Ratio: 2.1 (calc) (ref 1.0–2.5)
ALT: 8 U/L (ref 6–29)
AST: 13 U/L (ref 10–35)
Albumin: 4.5 g/dL (ref 3.6–5.1)
Alkaline phosphatase (APISO): 63 U/L (ref 37–153)
BUN: 18 mg/dL (ref 7–25)
CO2: 29 mmol/L (ref 20–32)
Calcium: 9.8 mg/dL (ref 8.6–10.4)
Chloride: 103 mmol/L (ref 98–110)
Creat: 0.9 mg/dL (ref 0.50–1.05)
Globulin: 2.1 g/dL (calc) (ref 1.9–3.7)
Glucose, Bld: 103 mg/dL — ABNORMAL HIGH (ref 65–99)
Potassium: 4.2 mmol/L (ref 3.5–5.3)
Sodium: 142 mmol/L (ref 135–146)
Total Bilirubin: 0.5 mg/dL (ref 0.2–1.2)
Total Protein: 6.6 g/dL (ref 6.1–8.1)
eGFR: 71 mL/min/{1.73_m2} (ref >=60)

## 2020-12-10 LAB — VITAMIN D 25 HYDROXY (VIT D DEFICIENCY, FRACTURES): Vit D, 25-Hydroxy: 104 ng/mL — ABNORMAL HIGH (ref 30–100)

## 2020-12-12 ENCOUNTER — Encounter: Payer: Self-pay | Admitting: Family Medicine

## 2020-12-12 ENCOUNTER — Other Ambulatory Visit: Payer: Self-pay

## 2020-12-12 ENCOUNTER — Ambulatory Visit (INDEPENDENT_AMBULATORY_CARE_PROVIDER_SITE_OTHER): Payer: Medicare Other | Admitting: Family Medicine

## 2020-12-12 VITALS — BP 124/62 | HR 70 | Temp 97.2°F | Resp 16 | Ht 63.0 in | Wt 143.0 lb

## 2020-12-12 DIAGNOSIS — Z0000 Encounter for general adult medical examination without abnormal findings: Secondary | ICD-10-CM

## 2020-12-12 DIAGNOSIS — M8580 Other specified disorders of bone density and structure, unspecified site: Secondary | ICD-10-CM | POA: Diagnosis not present

## 2020-12-12 DIAGNOSIS — Z23 Encounter for immunization: Secondary | ICD-10-CM

## 2020-12-12 DIAGNOSIS — Z0001 Encounter for general adult medical examination with abnormal findings: Secondary | ICD-10-CM

## 2020-12-12 NOTE — Progress Notes (Signed)
Subjective:    Patient ID: Chelsea Griffith, female    DOB: 10-Dec-1955, 65 y.o.   MRN: [id number]  HPI Patient is a 65 year old Caucasian female here today for complete physical exam.  Her colonoscopy was performed in 2026 and is good for 10 years.  She just had a mammogram earlier this year that was normal.  She has had a hysterectomy and therefore does not require Pap smear.  She is due for Pneumovax 23, flu shot, Shingrix, and a COVID booster.  She elected to receive the flu shot and Pneumovax 23 here today.  Her last bone density test was in 2020 and showed borderline osteopenia porosis.  She is due to repeat that.  She recently had a horse knocked her down and stepped on her posterior left hamstring.  She has a large purple hematoma the size of my fist on her posterior lateral hamstring.  However she has normal range of motion in her left knee without pain and is able to bear weight without any difficulty.  Is just sore to the touch.  She denies any falls or depression or memory loss. Lab on 12/09/2020  Component Date Value Ref Range Status   WBC 12/09/2020 5.4  3.8 - 10.8 Thousand/uL Final   RBC 12/09/2020 4.78  3.80 - 5.10 Million/uL Final   Hemoglobin 12/09/2020 13.9  11.7 - 15.5 g/dL Final   HCT 12/09/2020 42.0  35.0 - 45.0 % Final   MCV 12/09/2020 87.9  80.0 - 100.0 fL Final   MCH 12/09/2020 29.1  27.0 - 33.0 pg Final   MCHC 12/09/2020 33.1  32.0 - 36.0 g/dL Final   RDW 12/09/2020 13.1  11.0 - 15.0 % Final   Platelets 12/09/2020 176  140 - 400 Thousand/uL Final   MPV 12/09/2020 10.0  7.5 - 12.5 fL Final   Neutro Abs 12/09/2020 3,154  1,500 - 7,800 cells/uL Final   Lymphs Abs 12/09/2020 1,777  850 - 3,900 cells/uL Final   Absolute Monocytes 12/09/2020 378  200 - 950 cells/uL Final   Eosinophils Absolute 12/09/2020 81  15 - 500 cells/uL Final   Basophils Absolute 12/09/2020 11  0 - 200 cells/uL Final   Neutrophils Relative % 12/09/2020 58.4  % Final   Total Lymphocyte 12/09/2020  32.9  % Final   Monocytes Relative 12/09/2020 7.0  % Final   Eosinophils Relative 12/09/2020 1.5  % Final   Basophils Relative 12/09/2020 0.2  % Final   Glucose, Bld 12/09/2020 103 (A) 65 - 99 mg/dL Final   Comment: .            Fasting reference interval . For someone without known diabetes, a glucose value between 100 and 125 mg/dL is consistent with prediabetes and should be confirmed with a follow-up test. .    BUN 12/09/2020 18  7 - 25 mg/dL Final   Creat 12/09/2020 0.90  0.50 - 1.05 mg/dL Final   eGFR 12/09/2020 71  > OR = 60 mL/min/1.35m Final   Comment: The eGFR is based on the CKD-EPI 2021 equation. To calculate  the new eGFR from a previous Creatinine or Cystatin C result, go to https://www.kidney.org/professionals/ kdoqi/gfr%5Fcalculator    BUN/Creatinine Ratio 167/61/9509NOT APPLICABLE  6 - 22 (calc) Final   Sodium 12/09/2020 142  135 - 146 mmol/L Final   Potassium 12/09/2020 4.2  3.5 - 5.3 mmol/L Final   Chloride 12/09/2020 103  98 - 110 mmol/L Final   CO2 12/09/2020 29  20 - 32  mmol/L Final   Calcium 12/09/2020 9.8  8.6 - 10.4 mg/dL Final   Total Protein 12/09/2020 6.6  6.1 - 8.1 g/dL Final   Albumin 12/09/2020 4.5  3.6 - 5.1 g/dL Final   Globulin 12/09/2020 2.1  1.9 - 3.7 g/dL (calc) Final   AG Ratio 12/09/2020 2.1  1.0 - 2.5 (calc) Final   Total Bilirubin 12/09/2020 0.5  0.2 - 1.2 mg/dL Final   Alkaline phosphatase (APISO) 12/09/2020 63  37 - 153 U/L Final   AST 12/09/2020 13  10 - 35 U/L Final   ALT 12/09/2020 8  6 - 29 U/L Final   Cholesterol 12/09/2020 180  <200 mg/dL Final   HDL 12/09/2020 60  > OR = 50 mg/dL Final   Triglycerides 12/09/2020 118  <150 mg/dL Final   LDL Cholesterol (Calc) 12/09/2020 99  mg/dL (calc) Final   Comment: Reference range: <100 . Desirable range <100 mg/dL for primary prevention;   <70 mg/dL for patients with CHD or diabetic patients  with > or = 2 CHD risk factors. Marland Kitchen LDL-C is now calculated using the Martin-Hopkins   calculation, which is a validated novel method providing  better accuracy than the Friedewald equation in the  estimation of LDL-C.  Cresenciano Genre et al. Annamaria Helling. 1216;244(69): 2061-2068  (http://education.[facility].com/faq/FAQ164)    Total CHOL/HDL Ratio 12/09/2020 3.0  <5.0 (calc) Final   Non-HDL Cholesterol (Calc) 12/09/2020 120  <130 mg/dL (calc) Final   Comment: For patients with diabetes plus 1 major ASCVD risk  factor, treating to a non-HDL-C goal of <100 mg/dL  (LDL-C of <70 mg/dL) is considered a therapeutic  option.    Vit D, 25-Hydroxy 12/09/2020 104 (A) 30 - 100 ng/mL Final   Comment: Vitamin D Status         25-OH Vitamin D: . Deficiency:                    <20 ng/mL Insufficiency:             20 - 29 ng/mL Optimal:                 > or = 30 ng/mL . For 25-OH Vitamin D testing on patients on  D2-supplementation and patients for whom quantitation  of D2 and D3 fractions is required, the QuestAssureD(TM) 25-OH VIT D, (D2,D3), LC/MS/MS is recommended: order  code [id number] (patients >3yr). See Note 1 . Note 1 . For additional information, please refer to  http://education.[facility].com/faq/FAQ199  (This link is being provided for informational/ educational purposes only.)      Immunization History  Administered Date(s) Administered   Fluad Quad(high Dose 65+) 11/22/2019   Influenza,inj,Quad PF,6+ Mos 04/04/2017, 04/07/2018, 11/01/2018   PFIZER(Purple Top)SARS-COV-2 Vaccination 02/06/2020   Tdap 04/13/2013   Past Medical History:  Diagnosis Date   Anxiety    Arthritis    fingers and thumbs   Asthma    Depression    GERD (gastroesophageal reflux disease)    History of anemia    Hyperlipidemia    Osteoporosis    right hip   Rotator cuff tear, right    Past Surgical History:  Procedure Laterality Date   ABDOMINAL HYSTERECTOMY     CESAREAN SECTION      2 times   COLONOSCOPY     DILATION AND CURETTAGE OF UTERUS     several   ELBOW SURGERY  Bilateral     2times/1 time on left elbow   KNEE SURGERY  right knee   PARTIAL HYSTERECTOMY     2 times   REVERSE SHOULDER ARTHROPLASTY Right 09/06/2018   Procedure: REVERSE SHOULDER ARTHROPLASTY;  Surgeon: Hiram Gash, MD;  Location: [facility];  Service: Orthopedics;  Laterality: Right;   ROTATOR CUFF REPAIR     right shoulderx2    Current Outpatient Medications on File Prior to Visit  Medication Sig Dispense Refill   albuterol (PROAIR HFA) 108 (90 Base) MCG/ACT inhaler Inhale 2 puffs into the lungs every 4 (four) hours as needed for wheezing or shortness of breath. 18 g 2   clonazePAM (KLONOPIN) 1 MG tablet Take 1 mg by mouth every evening.      FLUoxetine (PROZAC) 10 MG tablet Take 20 mg by mouth daily.     fluticasone (FLONASE) 50 MCG/ACT nasal spray SPRAY 2 SPRAYS INTO EACH NOSTRIL EVERY DAY (Patient taking differently: Place 2 sprays into both nostrils daily as needed for allergies.) 48 mL 2   montelukast (SINGULAIR) 10 MG tablet TAKE 1 TABLET BY MOUTH EVERYDAY AT BEDTIME 90 tablet 3   Multiple Vitamin (MULTIVITAMIN WITH MINERALS) TABS tablet Take 2 tablets by mouth daily.     Multiple Vitamins-Minerals (PRESERVISION AREDS 2) CAPS Take 1 capsule by mouth 2 (two) times a day.     pravastatin (PRAVACHOL) 20 MG tablet TAKE 1 TABLET BY MOUTH EVERY DAY 90 tablet 1   traZODone (DESYREL) 100 MG tablet Take 100 mg by mouth at bedtime.      No current facility-administered medications on file prior to visit.   Allergies  Allergen Reactions   Codeine Itching   Milk-Related Compounds Nausea And Vomiting    Just milk, as an infant dropped significant amount of weight    Other     Smells may cause asthma atack   Social History   Socioeconomic History   Marital status: Married    Spouse name: Not on file   Number of children: Not on file   Years of education: Not on file   Highest education level: Not on file  Occupational History   Not on file  Tobacco Use   Smoking status: Never    Smokeless tobacco: Never  Vaping Use   Vaping Use: Never used  Substance and Sexual Activity   Alcohol use: Yes    Alcohol/week: 1.0 standard drink    Types: 1 Shots of liquor per week    Comment: occasional weekly drink   Drug use: No   Sexual activity: Yes    Comment: single  Other Topics Concern   Not on file  Social History Narrative   Not on file   Social Determinants of Health   Financial Resource Strain: Not on file  Food Insecurity: Not on file  Transportation Needs: Not on file  Physical Activity: Not on file  Stress: Not on file  Social Connections: Not on file  Intimate Partner Violence: Not on file   Family History  Problem Relation Age of Onset   Hyperlipidemia Mother    ADD / ADHD Son    Heart disease Maternal Grandmother    Colon cancer Neg Hx    Esophageal cancer Neg Hx    Rectal cancer Neg Hx    Stomach cancer Neg Hx       Review of Systems  All other systems reviewed and are negative.     Objective:   Physical Exam Vitals reviewed.  Constitutional:      General: She is not in acute distress.  Appearance: She is well-developed. She is not diaphoretic.  HENT:     Head: Normocephalic and atraumatic.     Right Ear: External ear normal.     Left Ear: External ear normal.     Nose: Nose normal.     Mouth/Throat:     Pharynx: No oropharyngeal exudate.  Eyes:     General: No scleral icterus.       Right eye: No discharge.        Left eye: No discharge.     Conjunctiva/sclera: Conjunctivae normal.     Pupils: Pupils are equal, round, and reactive to light.  Neck:     Thyroid: No thyromegaly.     Vascular: No JVD.     Trachea: No tracheal deviation.  Cardiovascular:     Rate and Rhythm: Normal rate and regular rhythm.     Heart sounds: Normal heart sounds. No murmur heard.   No friction rub. No gallop.  Pulmonary:     Effort: Pulmonary effort is normal. No respiratory distress.     Breath sounds: Normal breath sounds. No stridor. No  wheezing or rales.  Chest:     Chest wall: No tenderness.  Abdominal:     General: Bowel sounds are normal. There is no distension.     Palpations: Abdomen is soft. There is no mass.     Tenderness: There is no abdominal tenderness. There is no guarding or rebound.  Musculoskeletal:        General: No tenderness or deformity. Normal range of motion.     Cervical back: Normal range of motion and neck supple.  Lymphadenopathy:     Cervical: No cervical adenopathy.  Skin:    General: Skin is warm.     Coloration: Skin is not pale.     Findings: No erythema or rash.  Neurological:     Mental Status: She is alert and oriented to person, place, and time.     Cranial Nerves: No cranial nerve deficit.     Motor: No abnormal muscle tone.     Coordination: Coordination normal.     Deep Tendon Reflexes: Reflexes are normal and symmetric.  Psychiatric:        Behavior: Behavior normal.        Thought Content: Thought content normal.        Judgment: Judgment normal.          Assessment & Plan:  Osteopenia, unspecified location - Plan: DG Bone Density  Routine general medical examination at a health care facility Labs are significant for mild elevation in her sugar.  We discussed a low carbohydrate diet.  She received Pneumovax 23 and a flu shot.  Recommended COVID booster and Shingrix at her convenience.  Colonoscopy and mammogram are up-to-date.  Pap smear is not required.  I will schedule her a bone density.  Patient has a hematoma on her posterior left hamstring.  Recommended warm compresses and tincture of time.  No evidence of a fracture.

## 2020-12-12 NOTE — Addendum Note (Signed)
Addended by: Sheral Flow on: 12/12/2020 10:35 AM   Modules accepted: Orders

## 2020-12-16 ENCOUNTER — Other Ambulatory Visit: Payer: Self-pay | Admitting: Family Medicine

## 2021-02-17 DIAGNOSIS — M545 Low back pain, unspecified: Secondary | ICD-10-CM | POA: Diagnosis not present

## 2021-02-17 DIAGNOSIS — M47816 Spondylosis without myelopathy or radiculopathy, lumbar region: Secondary | ICD-10-CM | POA: Diagnosis not present

## 2021-02-17 DIAGNOSIS — M25551 Pain in right hip: Secondary | ICD-10-CM | POA: Diagnosis not present

## 2021-02-26 ENCOUNTER — Telehealth (INDEPENDENT_AMBULATORY_CARE_PROVIDER_SITE_OTHER): Payer: Medicare Other | Admitting: Nurse Practitioner

## 2021-02-26 ENCOUNTER — Other Ambulatory Visit: Payer: Self-pay

## 2021-02-26 ENCOUNTER — Encounter: Payer: Self-pay | Admitting: Nurse Practitioner

## 2021-02-26 DIAGNOSIS — J4521 Mild intermittent asthma with (acute) exacerbation: Secondary | ICD-10-CM

## 2021-02-26 DIAGNOSIS — J069 Acute upper respiratory infection, unspecified: Secondary | ICD-10-CM | POA: Diagnosis not present

## 2021-02-26 MED ORDER — PREDNISONE 20 MG PO TABS: 40.0000 mg | ORAL_TABLET | Freq: Every day | ORAL | 0 refills | Status: AC

## 2021-02-26 MED ORDER — BENZONATATE 200 MG PO CAPS: 200.0000 mg | ORAL_CAPSULE | Freq: Two times a day (BID) | ORAL | 0 refills | Status: DC | PRN

## 2021-02-26 NOTE — Progress Notes (Signed)
Subjective:    Patient ID: Chelsea Griffith, female    DOB: January 03, 1956, 65 y.o.   MRN: [id number]  HPI: Chelsea Griffith is a 65 y.o. female presenting virtually for cough.  Chief Complaint  Patient presents with   Cough    Cough non productive x 5 days , Laryngitis, sinus headache, ear pain, prev sore throat, no known fever, some chills,  has taken otc cough syrup, nasal spray, no known covid exp or home test ,states tussionex is the only med that works for this    [place] Does have a history of asthma. Onset: 5 days ago Fever: no Chills: yes  Cough: yes; not very productive - night is the worst Shortness of breath: no Wheezing: no Chest pain: no Chest tightness: yes; using albuterol several times per day and this does help Chest congestion: no Nasal congestion: yes Runny nose: no Post nasal drip: yes Sneezing: no Sore throat: yes Swollen glands: no Sinus pressure: yes Headache: yes Face pain: no Toothache: no Ear pain: no  Ear pressure: yes  Eyes red/itching:no Eye drainage/crusting: no  Nausea: no  Vomiting: no Diarrhea: no  Change in appetite:  yes; decreased   Loss of taste/smell: no  Rash: no Fatigue: yes Sick contacts: no Strep contacts: no  Context: fluctuating Recurrent sinusitis: no Treatments attempted: Saline nasal spray, delsym, honey/whiskey/lemon Relief with OTC medications: no  Allergies  Allergen Reactions   Codeine Itching   Milk-Related Compounds Nausea And Vomiting    Just milk, as an infant dropped significant amount of weight    Other     Smells may cause asthma atack    Outpatient Encounter Medications as of 02/26/2021  Medication Sig   albuterol (PROAIR HFA) 108 (90 Base) MCG/ACT inhaler Inhale 2 puffs into the lungs every 4 (four) hours as needed for wheezing or shortness of breath.   benzonatate (TESSALON) 200 MG capsule Take 1 capsule (200 mg total) by mouth 2 (two) times daily as needed for  cough. If this medication makes you drowsy, do not take while driving or operating heavy machinery.   cholecalciferol (VITAMIN D3) 25 MCG (1000 UNIT) tablet Take 1,000 Units by mouth daily.   clonazePAM (KLONOPIN) 1 MG tablet Take 1 mg by mouth every evening.    FLUoxetine (PROZAC) 10 MG tablet Take 20 mg by mouth daily.   fluticasone (FLONASE) 50 MCG/ACT nasal spray SPRAY 2 SPRAYS INTO EACH NOSTRIL EVERY DAY (Patient taking differently: Place 2 sprays into both nostrils daily as needed for allergies.)   levocetirizine (XYZAL) 5 MG tablet TAKE 1 TABLET BY MOUTH EVERY DAY IN THE EVENING   montelukast (SINGULAIR) 10 MG tablet TAKE 1 TABLET BY MOUTH EVERYDAY AT BEDTIME   Multiple Vitamin (MULTIVITAMIN WITH MINERALS) TABS tablet Take 2 tablets by mouth daily.   Multiple Vitamins-Minerals (PRESERVISION AREDS 2) CAPS Take 1 capsule by mouth 2 (two) times a day.   pravastatin (PRAVACHOL) 20 MG tablet TAKE 1 TABLET BY MOUTH EVERY DAY   predniSONE (DELTASONE) 20 MG tablet Take 2 tablets (40 mg total) by mouth daily with breakfast for 5 days.   traZODone (DESYREL) 100 MG tablet Take 100 mg by mouth at bedtime.    No facility-administered encounter medications on file as of 02/26/2021.    Patient Active Problem List   Diagnosis Date Noted   Low back pain 04/04/2020   Weakness with dizziness 12/15/2018   Depression 12/15/2018   Anxiety 12/15/2018   Dizziness 12/15/2018  Rotator cuff tear arthropathy, right 09/06/2018   Osteoporosis 04/08/2014   Elevated lipids 10/19/2013   GERD (gastroesophageal reflux disease) 10/19/2013   Chest pain 10/19/2013    Past Medical History:  Diagnosis Date   Anxiety    Arthritis    fingers and thumbs   Asthma    Depression    GERD (gastroesophageal reflux disease)    History of anemia    Hyperlipidemia    Osteoporosis    right hip   Rotator cuff tear, right     Relevant past medical, surgical, family and social history reviewed and updated as indicated.  Interim medical history since our last visit reviewed.  Review of Systems Per HPI unless specifically indicated above     Objective:    There were no vitals taken for this visit.  Wt Readings from Last 3 Encounters:  12/12/20 143 lb (64.9 kg)  01/15/20 174 lb (78.9 kg)  11/22/19 144 lb (65.3 kg)    Physical Exam Nursing note reviewed.  Constitutional:      General: She is not in acute distress.    Appearance: Normal appearance. She is not ill-appearing or toxic-appearing.  HENT:     Head: Normocephalic and atraumatic.     Nose: Congestion present. No rhinorrhea.     Mouth/Throat:     Mouth: Mucous membranes are moist.     Pharynx: Oropharynx is clear.  Eyes:     General: No scleral icterus.       Right eye: No discharge.        Left eye: No discharge.  Cardiovascular:     Comments: Unable to assess heart sounds via virtual visit. Pulmonary:     Effort: Pulmonary effort is normal. No respiratory distress.     Comments: Unable to assess lung sounds via virtual visit.  Patient talking in complete sentences during telemedicine visit without accessory muscle use.  Frequent dry sounding cough.  Skin:    Coloration: Skin is not jaundiced or pale.     Findings: No erythema.  Neurological:     Mental Status: She is alert and oriented to person, place, and time.  Psychiatric:        Mood and Affect: Mood normal.        Behavior: Behavior normal.        Thought Content: Thought content normal.        Judgment: Judgment normal.      Assessment & Plan:  1. Viral upper respiratory tract infection Acute.  Offered viral testing, however patient politely declines.  Reassured patient that symptoms and exam findings are most consistent with a viral upper respiratory infection and explained lack of efficacy of antibiotics against viruses.  Discussed expected course and features suggestive of secondary bacterial infection.  Continue supportive care. Increase fluid intake with water or  electrolyte solution like pedialyte. Encouraged acetaminophen as needed for fever/pain. Encouraged salt water gargling, chloraseptic spray and throat lozenges. Encouraged OTC guaifenesin. Encouraged saline sinus flushes and/or neti with humidified air.  Start Tessalon perles to help suppress cough.  Also start prednisone taper given history of asthma and productive cough.  Follow up if symptoms do not improve.  If symptoms worsen, go to urgent care or emergency room.  - benzonatate (TESSALON) 200 MG capsule; Take 1 capsule (200 mg total) by mouth 2 (two) times daily as needed for cough. If this medication makes you drowsy, do not take while driving or operating heavy machinery.  Dispense: 20 capsule; Refill: 0  2. Mild intermittent asthma with acute exacerbation  - predniSONE (DELTASONE) 20 MG tablet; Take 2 tablets (40 mg total) by mouth daily with breakfast for 5 days.  Dispense: 10 tablet; Refill: 0    Follow up plan: Return if symptoms worsen or fail to improve.  Due to the catastrophic nature of the COVID-19 pandemic, this video visit was completed soley via audio and visual contact via [facility] due to the restrictions of the COVID-19 pandemic.  All issues as above were discussed and addressed. Physical exam was done as above through visual confirmation on [facility]. If it was felt that the patient should be evaluated in the office, they were directed there. The patient verbally consented to this visit. Location of the patient: home Location of the provider: work Those involved with this call:  Provider: Noemi Chapel, DNP, FNP-C CMA: Anastasio Auerbach, LPN Front Desk/Registration: Santina Evans  Time spent on call:  8 minutes with patient face to face via video conference. More than 50% of this time was spent in counseling and coordination of care. 14 minutes total spent in review of patient's record and preparation of their chart. I verified patient identity using two factors (patient  name and date of birth). Patient consents verbally to being seen via telemedicine visit today.

## 2021-03-03 ENCOUNTER — Encounter: Payer: Self-pay | Admitting: Family Medicine

## 2021-03-03 ENCOUNTER — Ambulatory Visit (INDEPENDENT_AMBULATORY_CARE_PROVIDER_SITE_OTHER): Payer: Medicare Other | Admitting: Family Medicine

## 2021-03-03 ENCOUNTER — Other Ambulatory Visit: Payer: Self-pay

## 2021-03-03 VITALS — BP 120/88 | HR 83 | Temp 98.7°F | Resp 16 | Ht 63.0 in | Wt 140.0 lb

## 2021-03-03 DIAGNOSIS — J329 Chronic sinusitis, unspecified: Secondary | ICD-10-CM | POA: Diagnosis not present

## 2021-03-03 DIAGNOSIS — J310 Chronic rhinitis: Secondary | ICD-10-CM | POA: Diagnosis not present

## 2021-03-03 MED ORDER — AMOXICILLIN 875 MG PO TABS: 875.0000 mg | ORAL_TABLET | Freq: Two times a day (BID) | ORAL | 0 refills | Status: AC

## 2021-03-03 MED ORDER — HYDROCODONE BIT-HOMATROP MBR 5-1.5 MG/5ML PO SOLN: 5.0000 mL | Freq: Three times a day (TID) | ORAL | 0 refills | Status: DC | PRN

## 2021-03-03 NOTE — Progress Notes (Signed)
Subjective:    Patient ID: Chelsea Griffith, female    DOB: [birth year], 65 y.o.   MRN: [id number]  HPI  Seen 12/22.  Given prednisone and tessalon. Diagnosed with URI.  Having constant cough.  Seems to be upper respiratory.  There is no chest congestion.  There is no chest pain.  There is no rhonchi or rales on exam.  Her lungs are clear to auscultation.  However she is coughing frequently.  She continues to complain of severe pain and pressure in her sinuses.  She reports postnasal drip and head congestion. Past Medical History:  Diagnosis Date   Anxiety    Arthritis    fingers and thumbs   Asthma    Depression    GERD (gastroesophageal reflux disease)    History of anemia    Hyperlipidemia    Osteoporosis    right hip   Rotator cuff tear, right    Past Surgical History:  Procedure Laterality Date   ABDOMINAL HYSTERECTOMY     CESAREAN SECTION      2 times   COLONOSCOPY     DILATION AND CURETTAGE OF UTERUS     several   ELBOW SURGERY Bilateral     2times/1 time on left elbow   KNEE SURGERY     right knee   PARTIAL HYSTERECTOMY     2 times   REVERSE SHOULDER ARTHROPLASTY Right 09/06/2018   Procedure: REVERSE SHOULDER ARTHROPLASTY;  Surgeon: Hiram Gash, MD;  Location: [facility];  Service: Orthopedics;  Laterality: Right;   ROTATOR CUFF REPAIR     right shoulderx2    Current Outpatient Medications on File Prior to Visit  Medication Sig Dispense Refill   albuterol (PROAIR HFA) 108 (90 Base) MCG/ACT inhaler Inhale 2 puffs into the lungs every 4 (four) hours as needed for wheezing or shortness of breath. 18 g 2   benzonatate (TESSALON) 200 MG capsule Take 1 capsule (200 mg total) by mouth 2 (two) times daily as needed for cough. If this medication makes you drowsy, do not take while driving or operating heavy machinery. 20 capsule 0   cholecalciferol (VITAMIN D3) 25 MCG (1000 UNIT) tablet Take 1,000 Units by mouth daily.     clonazePAM (KLONOPIN) 1 MG tablet Take 1 mg by  mouth every evening.      FLUoxetine (PROZAC) 10 MG tablet Take 20 mg by mouth daily.     fluticasone (FLONASE) 50 MCG/ACT nasal spray SPRAY 2 SPRAYS INTO EACH NOSTRIL EVERY DAY (Patient taking differently: Place 2 sprays into both nostrils daily as needed for allergies.) 48 mL 2   levocetirizine (XYZAL) 5 MG tablet TAKE 1 TABLET BY MOUTH EVERY DAY IN THE EVENING 90 tablet 3   montelukast (SINGULAIR) 10 MG tablet TAKE 1 TABLET BY MOUTH EVERYDAY AT BEDTIME 90 tablet 3   Multiple Vitamin (MULTIVITAMIN WITH MINERALS) TABS tablet Take 2 tablets by mouth daily.     Multiple Vitamins-Minerals (PRESERVISION AREDS 2) CAPS Take 1 capsule by mouth 2 (two) times a day.     pravastatin (PRAVACHOL) 20 MG tablet TAKE 1 TABLET BY MOUTH EVERY DAY 90 tablet 1   predniSONE (DELTASONE) 20 MG tablet Take 2 tablets (40 mg total) by mouth daily with breakfast for 5 days. 10 tablet 0   traZODone (DESYREL) 100 MG tablet Take 100 mg by mouth at bedtime.      No current facility-administered medications on file prior to visit.   Allergies  Allergen Reactions  Codeine Itching   Milk-Related Compounds Nausea And Vomiting    Just milk, as an infant dropped significant amount of weight    Other     Smells may cause asthma atack   Social History   Socioeconomic History   Marital status: Married    Spouse name: Not on file   Number of children: Not on file   Years of education: Not on file   Highest education level: Not on file  Occupational History   Not on file  Tobacco Use   Smoking status: Never   Smokeless tobacco: Never  Vaping Use   Vaping Use: Never used  Substance and Sexual Activity   Alcohol use: Yes    Alcohol/week: 1.0 standard drink    Types: 1 Shots of liquor per week    Comment: occasional weekly drink   Drug use: No   Sexual activity: Yes    Comment: single  Other Topics Concern   Not on file  Social History Narrative   Not on file   Social Determinants of Health   Financial  Resource Strain: Not on file  Food Insecurity: Not on file  Transportation Needs: Not on file  Physical Activity: Not on file  Stress: Not on file  Social Connections: Not on file  Intimate Partner Violence: Not on file      Review of Systems  All other systems reviewed and are negative.     Objective:   Physical Exam Vitals reviewed.  Constitutional:      General: She is not in acute distress.    Appearance: She is well-developed. She is not diaphoretic.  HENT:     Right Ear: Tympanic membrane, ear canal and external ear normal.     Left Ear: Tympanic membrane, ear canal and external ear normal.     Nose: Mucosal edema, congestion and rhinorrhea present.     Right Sinus: Maxillary sinus tenderness and frontal sinus tenderness present.     Left Sinus: Maxillary sinus tenderness and frontal sinus tenderness present.     Mouth/Throat:     Pharynx: No oropharyngeal exudate or posterior oropharyngeal erythema.  Eyes:     Conjunctiva/sclera: Conjunctivae normal.  Cardiovascular:     Rate and Rhythm: Normal rate and regular rhythm.     Heart sounds: Normal heart sounds. No murmur heard. Pulmonary:     Effort: Pulmonary effort is normal.     Breath sounds: No decreased breath sounds, wheezing, rhonchi or rales.  Chest:     Chest wall: No tenderness.  Musculoskeletal:     Cervical back: Neck supple.  Lymphadenopathy:     Cervical: No cervical adenopathy.          Assessment & Plan:  Rhinosinusitis I believe this is still a virus.  I recommend that she stay on Sudafed for head congestion.  She can use Hycodan 1 teaspoon every 6 hours as needed for cough.  I recommended that she wait another 3 to 4 days.  If symptoms worsen she can start amoxicillin 875 mg twice daily for 10 days for bacterial sinusitis but I explained to the patient I feel this is most likely viral.

## 2021-03-10 ENCOUNTER — Other Ambulatory Visit: Payer: Self-pay

## 2021-03-10 MED ORDER — PRAVASTATIN SODIUM 20 MG PO TABS: 20.0000 mg | ORAL_TABLET | Freq: Every day | ORAL | 1 refills | Status: DC

## 2021-03-27 DIAGNOSIS — R059 Cough, unspecified: Secondary | ICD-10-CM | POA: Diagnosis not present

## 2021-03-27 DIAGNOSIS — J45909 Unspecified asthma, uncomplicated: Secondary | ICD-10-CM | POA: Diagnosis not present

## 2021-03-27 DIAGNOSIS — Z20822 Contact with and (suspected) exposure to covid-19: Secondary | ICD-10-CM | POA: Diagnosis not present

## 2021-03-31 ENCOUNTER — Encounter: Payer: Self-pay | Admitting: Family Medicine

## 2021-03-31 ENCOUNTER — Ambulatory Visit (INDEPENDENT_AMBULATORY_CARE_PROVIDER_SITE_OTHER): Payer: Medicare Other | Admitting: Family Medicine

## 2021-03-31 ENCOUNTER — Other Ambulatory Visit: Payer: Self-pay

## 2021-03-31 VITALS — BP 118/78 | HR 66 | Temp 97.7°F | Resp 18 | Ht 63.0 in | Wt 140.0 lb

## 2021-03-31 DIAGNOSIS — R058 Other specified cough: Secondary | ICD-10-CM | POA: Diagnosis not present

## 2021-03-31 MED ORDER — PANTOPRAZOLE SODIUM 40 MG PO TBEC: 40.0000 mg | DELAYED_RELEASE_TABLET | Freq: Every day | ORAL | 3 refills | Status: DC

## 2021-03-31 MED ORDER — FLUTICASONE PROPIONATE 50 MCG/ACT NA SUSP: 2.0000 | Freq: Every day | NASAL | 3 refills | Status: DC

## 2021-03-31 MED ORDER — TRAMADOL HCL 50 MG PO TABS: 50.0000 mg | ORAL_TABLET | Freq: Three times a day (TID) | ORAL | 0 refills | Status: AC | PRN

## 2021-03-31 NOTE — Progress Notes (Signed)
Subjective:    Patient ID: Chelsea Griffith, female    DOB: 04-06-1955, 66 y.o.   MRN: [id number]  HPI Patient continues to report constant cough now almost 2 months in duration.  The cough is productive of clear sputum.  She does report postnasal drip.  She denies any wheezing.  She was recently at another doctor's office in [place] and was given prednisone but the cough persist.  She reports postnasal drip and drainage.  She denies any fevers or chills or hemoptysis or pleurisy or shortness of breath or chest pain.  However the cough keeps her awake at night. Past Medical History:  Diagnosis Date   Anxiety    Arthritis    fingers and thumbs   Asthma    Depression    GERD (gastroesophageal reflux disease)    History of anemia    Hyperlipidemia    Osteoporosis    right hip   Rotator cuff tear, right    Past Surgical History:  Procedure Laterality Date   ABDOMINAL HYSTERECTOMY     CESAREAN SECTION      2 times   COLONOSCOPY     DILATION AND CURETTAGE OF UTERUS     several   ELBOW SURGERY Bilateral     2times/1 time on left elbow   KNEE SURGERY     right knee   PARTIAL HYSTERECTOMY     2 times   REVERSE SHOULDER ARTHROPLASTY Right 09/06/2018   Procedure: REVERSE SHOULDER ARTHROPLASTY;  Surgeon: Hiram Gash, MD;  Location: [facility];  Service: Orthopedics;  Laterality: Right;   ROTATOR CUFF REPAIR     right shoulderx2    Current Outpatient Medications on File Prior to Visit  Medication Sig Dispense Refill   albuterol (PROAIR HFA) 108 (90 Base) MCG/ACT inhaler Inhale 2 puffs into the lungs every 4 (four) hours as needed for wheezing or shortness of breath. 18 g 2   clonazePAM (KLONOPIN) 1 MG tablet Take 1 mg by mouth every evening.      FLUoxetine (PROZAC) 10 MG tablet Take 20 mg by mouth daily.     levocetirizine (XYZAL) 5 MG tablet TAKE 1 TABLET BY MOUTH EVERY DAY IN THE EVENING 90 tablet 3   montelukast (SINGULAIR) 10 MG tablet TAKE 1 TABLET BY MOUTH EVERYDAY AT BEDTIME  90 tablet 3   Multiple Vitamin (MULTIVITAMIN WITH MINERALS) TABS tablet Take 2 tablets by mouth daily.     Multiple Vitamins-Minerals (PRESERVISION AREDS 2) CAPS Take 1 capsule by mouth 2 (two) times a day.     pravastatin (PRAVACHOL) 20 MG tablet Take 1 tablet (20 mg total) by mouth daily. 90 tablet 1   predniSONE (DELTASONE) 50 MG tablet Take 50 mg by mouth daily.     traZODone (DESYREL) 100 MG tablet Take 100 mg by mouth at bedtime.      cholecalciferol (VITAMIN D3) 25 MCG (1000 UNIT) tablet Take 1,000 Units by mouth daily. (Patient not taking: Reported on 03/31/2021)     No current facility-administered medications on file prior to visit.   Allergies  Allergen Reactions   Codeine Itching   Milk-Related Compounds Nausea And Vomiting    Just milk, as an infant dropped significant amount of weight    Other     Smells may cause asthma atack   Social History   Socioeconomic History   Marital status: Married    Spouse name: Not on file   Number of children: Not on file   Years of  education: Not on file   Highest education level: Not on file  Occupational History   Not on file  Tobacco Use   Smoking status: Never   Smokeless tobacco: Never  Vaping Use   Vaping Use: Never used  Substance and Sexual Activity   Alcohol use: Yes    Alcohol/week: 1.0 standard drink    Types: 1 Shots of liquor per week    Comment: occasional weekly drink   Drug use: No   Sexual activity: Yes    Comment: single  Other Topics Concern   Not on file  Social History Narrative   Not on file   Social Determinants of Health   Financial Resource Strain: Not on file  Food Insecurity: Not on file  Transportation Needs: Not on file  Physical Activity: Not on file  Stress: Not on file  Social Connections: Not on file  Intimate Partner Violence: Not on file      Review of Systems  All other systems reviewed and are negative.     Objective:   Physical Exam Vitals reviewed.  Constitutional:       General: She is not in acute distress.    Appearance: She is well-developed. She is not diaphoretic.  HENT:     Right Ear: Tympanic membrane, ear canal and external ear normal.     Left Ear: Tympanic membrane, ear canal and external ear normal.     Nose: Congestion and rhinorrhea present.     Right Sinus: No maxillary sinus tenderness or frontal sinus tenderness.     Left Sinus: No maxillary sinus tenderness or frontal sinus tenderness.     Mouth/Throat:     Pharynx: No oropharyngeal exudate or posterior oropharyngeal erythema.  Eyes:     Conjunctiva/sclera: Conjunctivae normal.  Cardiovascular:     Rate and Rhythm: Normal rate and regular rhythm.     Heart sounds: Normal heart sounds. No murmur heard. Pulmonary:     Effort: Pulmonary effort is normal.     Breath sounds: No decreased breath sounds, wheezing, rhonchi or rales.  Chest:     Chest wall: No tenderness.  Musculoskeletal:     Cervical back: Neck supple.  Lymphadenopathy:     Cervical: No cervical adenopathy.          Assessment & Plan:  Upper airway cough syndrome At this point I believe the patient has upper airway cough syndrome.  Maximize therapy for postnasal drip and sinusitis by using Xyzal and Flonase.  Patient is already on Singulair.  Also start back on pantoprazole 40 mg a day for possible laryngal esophageal reflux.  Use tramadol 50 mg every 8 hours as needed for cough and then reassess in 2 weeks.  She had a chest x-ray at the other doctor's office in [place] that was clear.

## 2021-05-07 ENCOUNTER — Other Ambulatory Visit: Payer: Self-pay | Admitting: Family Medicine

## 2021-05-07 DIAGNOSIS — Z1231 Encounter for screening mammogram for malignant neoplasm of breast: Secondary | ICD-10-CM

## 2021-05-11 ENCOUNTER — Other Ambulatory Visit: Payer: Self-pay | Admitting: Family Medicine

## 2021-05-11 DIAGNOSIS — J069 Acute upper respiratory infection, unspecified: Secondary | ICD-10-CM

## 2021-05-11 DIAGNOSIS — J45901 Unspecified asthma with (acute) exacerbation: Secondary | ICD-10-CM

## 2021-06-01 ENCOUNTER — Ambulatory Visit
Admission: RE | Admit: 2021-06-01 | Discharge: 2021-06-01 | Disposition: A | Discharge status: Home / Self Care | Payer: Medicare Other | Source: Ambulatory Visit | Attending: Family Medicine | Admitting: Family Medicine

## 2021-06-01 DIAGNOSIS — Z1231 Encounter for screening mammogram for malignant neoplasm of breast: Secondary | ICD-10-CM

## 2021-06-01 DIAGNOSIS — M8580 Other specified disorders of bone density and structure, unspecified site: Secondary | ICD-10-CM

## 2021-06-01 DIAGNOSIS — M8588 Other specified disorders of bone density and structure, other site: Secondary | ICD-10-CM | POA: Diagnosis not present

## 2021-06-01 DIAGNOSIS — M810 Age-related osteoporosis without current pathological fracture: Secondary | ICD-10-CM | POA: Diagnosis not present

## 2021-06-01 DIAGNOSIS — Z780 Asymptomatic menopausal state: Secondary | ICD-10-CM | POA: Diagnosis not present

## 2021-06-08 ENCOUNTER — Other Ambulatory Visit: Payer: Self-pay

## 2021-06-08 DIAGNOSIS — M816 Localized osteoporosis [Lequesne]: Secondary | ICD-10-CM

## 2021-06-08 MED ORDER — ALENDRONATE SODIUM 70 MG PO TABS: 70.0000 mg | ORAL_TABLET | ORAL | 11 refills | Status: DC

## 2021-06-17 ENCOUNTER — Other Ambulatory Visit: Payer: Self-pay | Admitting: Family Medicine

## 2021-06-25 DIAGNOSIS — L298 Other pruritus: Secondary | ICD-10-CM | POA: Diagnosis not present

## 2021-06-25 DIAGNOSIS — L820 Inflamed seborrheic keratosis: Secondary | ICD-10-CM | POA: Diagnosis not present

## 2021-06-25 DIAGNOSIS — R208 Other disturbances of skin sensation: Secondary | ICD-10-CM | POA: Diagnosis not present

## 2021-06-25 DIAGNOSIS — L538 Other specified erythematous conditions: Secondary | ICD-10-CM | POA: Diagnosis not present

## 2021-06-25 DIAGNOSIS — L570 Actinic keratosis: Secondary | ICD-10-CM | POA: Diagnosis not present

## 2021-06-26 ENCOUNTER — Other Ambulatory Visit: Payer: Self-pay | Admitting: Family Medicine

## 2021-07-14 DIAGNOSIS — L244 Irritant contact dermatitis due to drugs in contact with skin: Secondary | ICD-10-CM | POA: Diagnosis not present

## 2021-07-30 DIAGNOSIS — H353131 Nonexudative age-related macular degeneration, bilateral, early dry stage: Secondary | ICD-10-CM | POA: Diagnosis not present

## 2021-08-14 ENCOUNTER — Other Ambulatory Visit: Payer: Self-pay

## 2021-08-14 ENCOUNTER — Telehealth: Payer: Self-pay | Admitting: Family Medicine

## 2021-08-14 MED ORDER — SCOPOLAMINE 1 MG/3DAYS TD PT72
1.0000 | MEDICATED_PATCH | TRANSDERMAL | 0 refills | Status: DC
Start: 2021-08-14 — End: 2023-03-03

## 2021-08-14 NOTE — Telephone Encounter (Signed)
Patient is aware that the prescription was sent in.

## 2021-08-14 NOTE — Telephone Encounter (Signed)
LVM for patient to rtn my call.

## 2021-08-14 NOTE — Telephone Encounter (Signed)
Scopolamine patches sent in per protocol. Pt will be contacted by [place] [place].

## 2021-08-14 NOTE — Telephone Encounter (Signed)
Patient going fishing in [place] and is requesting a patch for motion sickness to be called into [facility] on [address]. She has had this patch before and it works well.  CB# [phone]

## 2021-09-01 ENCOUNTER — Ambulatory Visit (INDEPENDENT_AMBULATORY_CARE_PROVIDER_SITE_OTHER): Payer: Medicare Other | Admitting: Family Medicine

## 2021-09-01 VITALS — BP 116/68 | HR 62 | Temp 97.9°F | Ht 63.0 in | Wt 141.0 lb

## 2021-09-01 DIAGNOSIS — J019 Acute sinusitis, unspecified: Secondary | ICD-10-CM | POA: Diagnosis not present

## 2021-09-01 DIAGNOSIS — B9689 Other specified bacterial agents as the cause of diseases classified elsewhere: Secondary | ICD-10-CM | POA: Diagnosis not present

## 2021-09-01 MED ORDER — AMOXICILLIN 875 MG PO TABS
875.0000 mg | ORAL_TABLET | Freq: Two times a day (BID) | ORAL | 0 refills | Status: AC
Start: 2021-09-01 — End: 2021-09-11

## 2021-09-01 MED ORDER — FLUCONAZOLE 150 MG PO TABS: 150.0000 mg | ORAL_TABLET | Freq: Once | ORAL | 0 refills | Status: AC

## 2021-09-17 DIAGNOSIS — L821 Other seborrheic keratosis: Secondary | ICD-10-CM | POA: Diagnosis not present

## 2021-09-17 DIAGNOSIS — L814 Other melanin hyperpigmentation: Secondary | ICD-10-CM | POA: Diagnosis not present

## 2021-09-17 DIAGNOSIS — D225 Melanocytic nevi of trunk: Secondary | ICD-10-CM | POA: Diagnosis not present

## 2021-09-17 DIAGNOSIS — L708 Other acne: Secondary | ICD-10-CM | POA: Diagnosis not present

## 2021-10-14 ENCOUNTER — Other Ambulatory Visit: Payer: Self-pay | Admitting: Family Medicine

## 2021-10-14 NOTE — Telephone Encounter (Signed)
Pantoprazole and levocetirizine refilled 12/16/2020 #90 3 refills - 1 year supply. Requested Prescriptions  Pending Prescriptions Disp Refills  . pantoprazole (PROTONIX) 40 MG tablet [Pharmacy Med Name: PANTOPRAZOLE SOD DR 40 MG TAB] 90 tablet 1    Sig: TAKE 1 TABLET BY MOUTH EVERY DAY     Gastroenterology: Proton Pump Inhibitors Passed - 10/14/2021  7:36 AM      Passed - Valid encounter within last 12 months    Recent Outpatient Visits          6 months ago Upper airway cough syndrome   [place] [facility] Schaumann, Cammie Mcgee, MD   7 months ago Rhinosinusitis   [place] Susy Frizzle, MD   7 months ago Viral upper respiratory tract infection   [place] Eulogio Bear, NP   10 months ago Osteopenia, unspecified location   [facility], Cammie Mcgee, MD   1 year ago [place], Cammie Mcgee, MD      Future Appointments            In 2 months Pickard, Cammie Mcgee, MD [place]           . fluticasone (FLONASE) 50 MCG/ACT nasal spray [Pharmacy Med Name: FLUTICASONE PROP 50 MCG SPRAY] 48 mL 1    Sig: PLACE 2 SPRAYS INTO EACH NOSTRIL DAILY.     Ear, Nose, and Throat: Nasal Preparations - Corticosteroids Passed - 10/14/2021  7:36 AM      Passed - Valid encounter within last 12 months    Recent Outpatient Visits          6 months ago Upper airway cough syndrome   [place] [facility]enn[facility] Schaumann, Cammie Mcgee, MD   7 months ago [place] Dennard Schaumann, Cammie Mcgee, MD   7 months ago Viral upper respiratory tract infection   [place] Eulogio Bear, NP   10 months ago Osteopenia, unspecified location   [place] Pickard, Cammie Mcgee, MD   1 year ago [place] Susy Frizzle, MD      Future Appointments            In 2 months Pickard,  Cammie Mcgee, MD [place]           . levocetirizine (XYZAL) 5 MG tablet [Pharmacy Med Name: LEVOCETIRIZINE 5 MG TABLET] 90 tablet 3    Sig: TAKE 1 TABLET BY MOUTH EVERY DAY IN THE EVENING     Ear, Nose, and Throat:  Antihistamines - levocetirizine dihydrochloride Passed - 10/14/2021  7:36 AM      Passed - Cr in normal range and within 360 days    Creat  Date Value Ref Range Status  12/09/2020 0.90 0.50 - 1.05 mg/dL Final         Passed - eGFR is 10 or above and within 360 days    GFR, Est African American  Date Value Ref Range Status  11/20/2019 95 > OR = 60 mL/min/1.47m Final   GFR, Est Non African American  Date Value Ref Range Status  11/20/2019 82 > OR = 60 mL/min/1.775mFinal   eGFR  Date Value Ref Range Status  12/09/2020 71 > OR = 60 mL/min/1.7323minal    Comment:    The eGFR is based on the CKD-EPI 2021 equation.  To calculate  the new eGFR from a previous Creatinine or Cystatin C result, go to https://www.kidney.org/professionals/ kdoqi/gfr%[email or web address]          Passed - Valid encounter within last 12 months    Recent Outpatient Visits          6 months ago Upper airway cough syndrome   [place] [facility] Schaumann, Cammie Mcgee, MD   7 months ago Rhinosinusitis   [place] [facility]enn[facility] Schaumann Cammie Mcgee, MD   7 months ago Viral upper respiratory tract infection   [place] Eulogio Bear, NP   10 months ago Osteopenia, unspecified location   [place] Pickard, Cammie Mcgee, MD   1 year ago [place] Pickard, Cammie Mcgee, MD      Future Appointments            In 2 months Pickard, Cammie Mcgee, MD [place]

## 2021-10-29 DIAGNOSIS — H25013 Cortical age-related cataract, bilateral: Secondary | ICD-10-CM | POA: Diagnosis not present

## 2021-10-29 DIAGNOSIS — H2513 Age-related nuclear cataract, bilateral: Secondary | ICD-10-CM | POA: Diagnosis not present

## 2021-10-29 DIAGNOSIS — H353131 Nonexudative age-related macular degeneration, bilateral, early dry stage: Secondary | ICD-10-CM | POA: Diagnosis not present

## 2021-10-29 DIAGNOSIS — H2511 Age-related nuclear cataract, right eye: Secondary | ICD-10-CM | POA: Diagnosis not present

## 2021-10-29 DIAGNOSIS — H18413 Arcus senilis, bilateral: Secondary | ICD-10-CM | POA: Diagnosis not present

## 2021-10-29 DIAGNOSIS — H25043 Posterior subcapsular polar age-related cataract, bilateral: Secondary | ICD-10-CM | POA: Diagnosis not present

## 2021-11-12 ENCOUNTER — Other Ambulatory Visit: Payer: Self-pay | Admitting: Family Medicine

## 2021-12-06 ENCOUNTER — Other Ambulatory Visit: Payer: Self-pay

## 2021-12-06 ENCOUNTER — Emergency Department (HOSPITAL_COMMUNITY): Payer: Medicare Other

## 2021-12-06 ENCOUNTER — Encounter (HOSPITAL_COMMUNITY): Payer: Self-pay | Admitting: *Deleted

## 2021-12-06 ENCOUNTER — Emergency Department (HOSPITAL_COMMUNITY)
Admission: EM | Admit: 2021-12-06 | Discharge: 2021-12-06 | Disposition: A | Discharge status: Home / Self Care | Payer: Medicare Other | Attending: Emergency Medicine | Admitting: Emergency Medicine

## 2021-12-06 DIAGNOSIS — R0789 Other chest pain: Secondary | ICD-10-CM | POA: Diagnosis not present

## 2021-12-06 DIAGNOSIS — W5512XA Struck by horse, initial encounter: Secondary | ICD-10-CM | POA: Insufficient documentation

## 2021-12-06 DIAGNOSIS — S3991XA Unspecified injury of abdomen, initial encounter: Secondary | ICD-10-CM | POA: Diagnosis present

## 2021-12-06 DIAGNOSIS — J439 Emphysema, unspecified: Secondary | ICD-10-CM | POA: Diagnosis not present

## 2021-12-06 DIAGNOSIS — N200 Calculus of kidney: Secondary | ICD-10-CM | POA: Diagnosis not present

## 2021-12-06 DIAGNOSIS — R109 Unspecified abdominal pain: Secondary | ICD-10-CM | POA: Diagnosis not present

## 2021-12-06 DIAGNOSIS — S301XXA Contusion of abdominal wall, initial encounter: Secondary | ICD-10-CM | POA: Insufficient documentation

## 2021-12-06 DIAGNOSIS — R079 Chest pain, unspecified: Secondary | ICD-10-CM | POA: Diagnosis not present

## 2021-12-06 LAB — CBC WITH DIFFERENTIAL/PLATELET
Abs Immature Granulocytes: 0.02 10*3/uL (ref 0.00–0.07)
Basophils Absolute: 0 10*3/uL (ref 0.0–0.1)
Basophils Relative: 0 %
Eosinophils Absolute: 0 10*3/uL (ref 0.0–0.5)
Eosinophils Relative: 1 %
HCT: 40.2 % (ref 36.0–46.0)
Hemoglobin: 13.7 g/dL (ref 12.0–15.0)
Immature Granulocytes: 0 %
Lymphocytes Relative: 21 %
Lymphs Abs: 1.3 10*3/uL (ref 0.7–4.0)
MCH: 29.1 pg (ref 26.0–34.0)
MCHC: 34.1 g/dL (ref 30.0–36.0)
MCV: 85.5 fL (ref 80.0–100.0)
Monocytes Absolute: 0.3 10*3/uL (ref 0.1–1.0)
Monocytes Relative: 5 %
Neutro Abs: 4.8 10*3/uL (ref 1.7–7.7)
Neutrophils Relative %: 73 %
Platelets: 169 10*3/uL (ref 150–400)
RBC: 4.7 MIL/uL (ref 3.87–5.11)
RDW: 12.5 % (ref 11.5–15.5)
WBC: 6.5 10*3/uL (ref 4.0–10.5)
nRBC: 0 % (ref 0.0–0.2)

## 2021-12-06 LAB — COMPREHENSIVE METABOLIC PANEL
ALT: 24 U/L (ref 0–44)
AST: 33 U/L (ref 15–41)
Albumin: 4.4 g/dL (ref 3.5–5.0)
Alkaline Phosphatase: 52 U/L (ref 38–126)
Anion gap: 7 (ref 5–15)
BUN: 15 mg/dL (ref 8–23)
CO2: 30 mmol/L (ref 22–32)
Calcium: 9.5 mg/dL (ref 8.9–10.3)
Chloride: 103 mmol/L (ref 98–111)
Creatinine, Ser: 0.69 mg/dL (ref 0.44–1.00)
GFR, Estimated: >60 mL/min (ref >60)
Glucose, Bld: 108 mg/dL — ABNORMAL HIGH (ref 70–99)
Potassium: 4.2 mmol/L (ref 3.5–5.1)
Sodium: 140 mmol/L (ref 135–145)
Total Bilirubin: 0.8 mg/dL (ref 0.3–1.2)
Total Protein: 7.2 g/dL (ref 6.5–8.1)

## 2021-12-06 LAB — LIPASE, BLOOD: Lipase: 93 U/L — ABNORMAL HIGH (ref 11–51)

## 2021-12-06 MED ORDER — IOHEXOL 300 MG/ML  SOLN
100.0000 mL | Freq: Once | INTRAMUSCULAR | Status: AC | PRN
  Administered 2021-12-06: 100 mL via INTRAVENOUS

## 2021-12-06 MED ORDER — TRAMADOL HCL 50 MG PO TABS
50.0000 mg | ORAL_TABLET | Freq: Once | ORAL | Status: DC
  Filled 2021-12-06: qty 1

## 2021-12-06 MED ORDER — ACETAMINOPHEN 500 MG PO TABS
1000.0000 mg | ORAL_TABLET | Freq: Once | ORAL | Status: AC
  Administered 2021-12-06: 1000 mg via ORAL
  Filled 2021-12-06: qty 2

## 2021-12-06 NOTE — ED Provider Notes (Signed)
[facility] EMERGENCY DEPARTMENT Provider Note   CSN: [id number] Arrival date & time: 12/06/21  1523     History  Chief Complaint  Patient presents with   kicked by horse   Abdominal Pain    Naysa Paisleigh Maroney is a 66 y.o. female.  Patient c/o being kicked by horse shortly pta today. Was kicked to upper abd. C/o pain to upper abd and lower chest wall and mid back. Symptoms acute onset, moderate, dull, non radiating. No loc. No headache. No neck pain. No radicular pain or numbness/weakness. Ambulatory post injury. No anticoagulant use. Felt fine prior to injury, was asymptomatic.   The history is provided by the patient and medical records.  Abdominal Pain Associated symptoms: no chest pain, no fever, no shortness of breath and no vomiting        Home Medications Prior to Admission medications   Medication Sig Start Date End Date Taking? Authorizing Provider  albuterol (PROAIR HFA) 108 (90 Base) MCG/ACT inhaler Inhale 2 puffs into the lungs every 4 (four) hours as needed for wheezing or shortness of breath. 04/24/19   Susy Frizzle, MD  alendronate (FOSAMAX) 70 MG tablet Take 1 tablet (70 mg total) by mouth every 7 (seven) days. Take with a full glass of water on an empty stomach. 06/08/21   Susy Frizzle, MD  cholecalciferol (VITAMIN D3) 25 MCG (1000 UNIT) tablet Take 1,000 Units by mouth daily.    [provider]  clonazePAM (KLONOPIN) 1 MG tablet Take 1 mg by mouth every evening.  04/10/13   [provider]  FLUoxetine (PROZAC) 10 MG tablet Take 20 mg by mouth daily.    [provider]  fluticasone (FLONASE) 50 MCG/ACT nasal spray PLACE 2 SPRAYS INTO EACH NOSTRIL DAILY. 10/14/21   Susy Frizzle, MD  levocetirizine (XYZAL) 5 MG tablet TAKE 1 TABLET BY MOUTH EVERY DAY IN THE EVENING 11/12/21   Susy Frizzle, MD  montelukast (SINGULAIR) 10 MG tablet TAKE 1 TABLET BY MOUTH EVERYDAY AT BEDTIME Patient not taking: Reported on 09/01/2021 05/11/21    Susy Frizzle, MD  Multiple Vitamin (MULTIVITAMIN WITH MINERALS) TABS tablet Take 2 tablets by mouth daily.    [provider]  Multiple Vitamins-Minerals (PRESERVISION AREDS 2) CAPS Take 1 capsule by mouth 2 (two) times a day.    [provider]  pantoprazole (PROTONIX) 40 MG tablet TAKE 1 TABLET BY MOUTH EVERY DAY 06/17/21   Susy Frizzle, MD  pravastatin (PRAVACHOL) 20 MG tablet TAKE 1 TABLET BY MOUTH EVERY DAY 06/17/21   Susy Frizzle, MD  predniSONE (DELTASONE) 50 MG tablet Take 50 mg by mouth daily. Patient not taking: Reported on 09/01/2021 03/28/21   [provider]  scopolamine (TRANSDERM SCOP, 1.5 MG,) 1 MG/3DAYS Place 1 patch (1.5 mg total) onto the skin every 3 (three) days. 08/14/21   Susy Frizzle, MD  traZODone (DESYREL) 100 MG tablet Take 100 mg by mouth at bedtime.  03/02/13   [provider]      Allergies    Codeine, Milk-related compounds, and Other    Review of Systems   Review of Systems  Constitutional:  Negative for fever.  HENT:  Negative for nosebleeds.   Eyes:  Negative for visual disturbance.  Respiratory:  Negative for shortness of breath.   Cardiovascular:  Negative for chest pain.  Gastrointestinal:  Positive for abdominal pain. Negative for vomiting.  Genitourinary:  Negative for flank pain.  Musculoskeletal:  Negative for  neck pain.  Skin:  Negative for wound.  Neurological:  Negative for weakness, numbness and headaches.  Hematological:  Does not bruise/bleed easily.  Psychiatric/Behavioral:  Negative for confusion.     Physical Exam Updated Vital Signs BP 115/84 (BP Location: Right Arm)   Pulse 67   Temp 98.4 F (36.9 C) (Oral)   Resp 16   Ht 1.6 m ('5\' 3"'$ )   Wt 64.4 kg   SpO2 97%   BMI 25.15 kg/m  Physical Exam Vitals and nursing note reviewed.  Constitutional:      Appearance: Normal appearance. She is well-developed.  HENT:     Head: Atraumatic.     Nose: Nose normal.     Mouth/Throat:      Mouth: Mucous membranes are moist.  Eyes:     General: No scleral icterus.    Conjunctiva/sclera: Conjunctivae normal.     Pupils: Pupils are equal, round, and reactive to light.  Neck:     Vascular: No carotid bruit.     Trachea: No tracheal deviation.  Cardiovascular:     Rate and Rhythm: Normal rate and regular rhythm.     Pulses: Normal pulses.     Heart sounds: Normal heart sounds. No murmur heard.    No friction rub. No gallop.  Pulmonary:     Effort: Pulmonary effort is normal. No respiratory distress.     Breath sounds: Normal breath sounds.     Comments: Lower chest wall tenderness. Normal chest movement, no crepitus.  Chest:     Chest wall: Tenderness present.  Abdominal:     General: Bowel sounds are normal. There is no distension.     Palpations: Abdomen is soft.     Tenderness: There is abdominal tenderness.     Comments: Upper abd tenderness.   Genitourinary:    Comments: No cva tenderness.  Musculoskeletal:        General: No swelling.     Cervical back: Normal range of motion and neck supple. No rigidity or tenderness. No muscular tenderness.     Comments: CTLS spine, non tender, aligned, no step off. Good rom bil extremities without pain or focal bony tenderness.   Skin:    General: Skin is warm and dry.     Findings: No rash.  Neurological:     Mental Status: She is alert.     Comments: Alert, speech normal.  GCS 15. Motor/sens grossly intact bil.   Psychiatric:        Mood and Affect: Mood normal.     ED Results / Procedures / Treatments   Labs (all labs ordered are listed, but only abnormal results are displayed) Results for orders placed or performed during the hospital encounter of 12/06/21  Comprehensive metabolic panel  Result Value Ref Range   Sodium 140 135 - 145 mmol/L   Potassium 4.2 3.5 - 5.1 mmol/L   Chloride 103 98 - 111 mmol/L   CO2 30 22 - 32 mmol/L   Glucose, Bld 108 (H) 70 - 99 mg/dL   BUN 15 8 - 23 mg/dL   Creatinine, Ser  0.69 0.44 - 1.00 mg/dL   Calcium 9.5 8.9 - 10.3 mg/dL   Total Protein 7.2 6.5 - 8.1 g/dL   Albumin 4.4 3.5 - 5.0 g/dL   AST 33 15 - 41 U/L   ALT 24 0 - 44 U/L   Alkaline Phosphatase 52 38 - 126 U/L   Total Bilirubin 0.8 0.3 - 1.2 mg/dL   GFR,  Estimated >60 >60 mL/min   Anion gap 7 5 - 15  CBC with Differential  Result Value Ref Range   WBC 6.5 4.0 - 10.5 K/uL   RBC 4.70 3.87 - 5.11 MIL/uL   Hemoglobin 13.7 12.0 - 15.0 g/dL   HCT 40.2 36.0 - 46.0 %   MCV 85.5 80.0 - 100.0 fL   MCH 29.1 26.0 - 34.0 pg   MCHC 34.1 30.0 - 36.0 g/dL   RDW 12.5 11.5 - 15.5 %   Platelets 169 150 - 400 K/uL   nRBC 0.0 0.0 - 0.2 %   Neutrophils Relative % 73 %   Neutro Abs 4.8 1.7 - 7.7 K/uL   Lymphocytes Relative 21 %   Lymphs Abs 1.3 0.7 - 4.0 K/uL   Monocytes Relative 5 %   Monocytes Absolute 0.3 0.1 - 1.0 K/uL   Eosinophils Relative 1 %   Eosinophils Absolute 0.0 0.0 - 0.5 K/uL   Basophils Relative 0 %   Basophils Absolute 0.0 0.0 - 0.1 K/uL   Immature Granulocytes 0 %   Abs Immature Granulocytes 0.02 0.00 - 0.07 K/uL  Lipase, blood  Result Value Ref Range   Lipase 93 (H) 11 - 51 U/L      EKG None  Radiology CT Chest W Contrast  Result Date: 12/06/2021 CLINICAL DATA:  Trauma, kicked by horse, abdominal/back pain EXAM: CT CHEST, ABDOMEN, AND PELVIS WITH CONTRAST TECHNIQUE: Multidetector CT imaging of the chest, abdomen and pelvis was performed following the standard protocol during bolus administration of intravenous contrast. RADIATION DOSE REDUCTION: This exam was performed according to the departmental dose-optimization program which includes automated exposure control, adjustment of the mA and/or kV according to patient size and/or use of iterative reconstruction technique. CONTRAST:  140m OMNIPAQUE IOHEXOL 300 MG/ML  SOLN COMPARISON:  CT abdomen/pelvis dated 02/10/2015. FINDINGS: CT CHEST FINDINGS Cardiovascular: No evidence of traumatic aortic injury. The heart is normal in size.  No  pericardial effusion. Mediastinum/Nodes: No evidence of intra mediastinal hematoma. No suspicious mediastinal lymphadenopathy. Visualized thyroid is unremarkable. Lungs/Pleura: Lungs are essentially clear. Very mild biapical pleural-parenchymal scarring. Mild centrilobular emphysematous changes, upper lung predominant. No suspicious pulmonary nodules. No focal consolidation or aspiration. No pleural effusion or pneumothorax. Musculoskeletal: No fracture is seen. Sternum, clavicles, scapulae, and bilateral ribs are intact. Thoracic spine is within normal limits. CT ABDOMEN PELVIS FINDINGS Hepatobiliary: Liver is within normal limits. No perihepatic fluid/hemorrhage. Layering tiny gallstone (series 602/image 63), without associated inflammatory changes. No intrahepatic or extrahepatic ductal dilatation. Pancreas: Within normal limits. Spleen: Within normal limits.  No perisplenic fluid/hemorrhage. Adrenals/Urinary Tract: Adrenal glands are within normal limits. Right kidney is within normal limits. 5 mm nonobstructing interpolar left renal calculus (series 602/image 84). No hydronephrosis. Bladder is within normal limits. Stomach/Bowel: Stomach is within normal limits. No evidence of bowel obstruction. Normal appendix (series 602/image 94). No colonic wall thickening or inflammatory changes. Vascular/Lymphatic: No evidence of abdominal aortic aneurysm. No suspicious abdominopelvic lymphadenopathy. Reproductive: Status post hysterectomy. No adnexal masses. Other: No abdominopelvic ascites. No hemoperitoneum or free air. Musculoskeletal: No fracture is seen. Visualized bony pelvis and bilateral proximal femurs are intact. Lumbar spine is within normal limits. IMPRESSION: No evidence of traumatic injury to the chest, abdomen, or pelvis. 5 mm nonobstructing interpolar left renal calculus. No hydronephrosis. Cholelithiasis, without associated inflammatory changes. Electronically Signed   By: SJulian HyM.D.   On:  12/06/2021 19:13   CT Abdomen Pelvis W Contrast  Result Date: 12/06/2021 CLINICAL DATA:  Trauma,  kicked by horse, abdominal/back pain EXAM: CT CHEST, ABDOMEN, AND PELVIS WITH CONTRAST TECHNIQUE: Multidetector CT imaging of the chest, abdomen and pelvis was performed following the standard protocol during bolus administration of intravenous contrast. RADIATION DOSE REDUCTION: This exam was performed according to the departmental dose-optimization program which includes automated exposure control, adjustment of the mA and/or kV according to patient size and/or use of iterative reconstruction technique. CONTRAST:  151m OMNIPAQUE IOHEXOL 300 MG/ML  SOLN COMPARISON:  CT abdomen/pelvis dated 02/10/2015. FINDINGS: CT CHEST FINDINGS Cardiovascular: No evidence of traumatic aortic injury. The heart is normal in size.  No pericardial effusion. Mediastinum/Nodes: No evidence of intra mediastinal hematoma. No suspicious mediastinal lymphadenopathy. Visualized thyroid is unremarkable. Lungs/Pleura: Lungs are essentially clear. Very mild biapical pleural-parenchymal scarring. Mild centrilobular emphysematous changes, upper lung predominant. No suspicious pulmonary nodules. No focal consolidation or aspiration. No pleural effusion or pneumothorax. Musculoskeletal: No fracture is seen. Sternum, clavicles, scapulae, and bilateral ribs are intact. Thoracic spine is within normal limits. CT ABDOMEN PELVIS FINDINGS Hepatobiliary: Liver is within normal limits. No perihepatic fluid/hemorrhage. Layering tiny gallstone (series 602/image 63), without associated inflammatory changes. No intrahepatic or extrahepatic ductal dilatation. Pancreas: Within normal limits. Spleen: Within normal limits.  No perisplenic fluid/hemorrhage. Adrenals/Urinary Tract: Adrenal glands are within normal limits. Right kidney is within normal limits. 5 mm nonobstructing interpolar left renal calculus (series 602/image 84). No hydronephrosis. Bladder is  within normal limits. Stomach/Bowel: Stomach is within normal limits. No evidence of bowel obstruction. Normal appendix (series 602/image 94). No colonic wall thickening or inflammatory changes. Vascular/Lymphatic: No evidence of abdominal aortic aneurysm. No suspicious abdominopelvic lymphadenopathy. Reproductive: Status post hysterectomy. No adnexal masses. Other: No abdominopelvic ascites. No hemoperitoneum or free air. Musculoskeletal: No fracture is seen. Visualized bony pelvis and bilateral proximal femurs are intact. Lumbar spine is within normal limits. IMPRESSION: No evidence of traumatic injury to the chest, abdomen, or pelvis. 5 mm nonobstructing interpolar left renal calculus. No hydronephrosis. Cholelithiasis, without associated inflammatory changes. Electronically Signed   By: SJulian HyM.D.   On: 12/06/2021 19:13    Procedures Procedures    Medications Ordered in ED Medications - No data to display  ED Course/ Medical Decision Making/ A&P                           Medical Decision Making Problems Addressed: Contusion of abdominal wall, initial encounter: acute illness or injury with systemic symptoms that poses a threat to life or bodily functions Struck by horse, initial encounter: acute illness or injury with systemic symptoms that poses a threat to life or bodily functions  Amount and/or Complexity of Data Reviewed Independent Historian: spouse    Details: hx External Data Reviewed: notes. Labs: ordered. Decision-making details documented in ED Course. Radiology: ordered and independent interpretation performed. Decision-making details documented in ED Course.  Risk OTC drugs. Prescription drug management. Decision regarding hospitalization.   Iv ns. Continuous pulse ox and cardiac monitoring. Labs ordered/sent. Imaging ordered.   Diff dx includes spleen injury, liver injury, rib fxs, etc - dispo decision including potential need for admission considered -  will get imaging and reassess.   Reviewed nursing notes and prior charts for additional history. External reports reviewed. Additional history from: spouse.   Cardiac monitor: sinus rhythm, rate  Labs reviewed/interpreted by me - wbc and hgb normal.   CT reviewed/interpreted by me - no acute trauma.   Ultram po. Acetaminophen po.  Po fluids.   Pt  appears stable for d/c.   Return precautions provided.             Final Clinical Impression(s) / ED Diagnoses Final diagnoses:  None    Rx / DC Orders ED Discharge Orders     None         Lajean Saver, MD 12/06/21 [id number]

## 2021-12-06 NOTE — Discharge Instructions (Addendum)
It was our pleasure to provide your ER care today - we hope that you feel better.  Take acetaminophen or ibuprofen as need.   Your scans show no acute traumatic injury - incidental note was made of gallstones and kidney stone.   Follow up with primary care doctor in the next couple weeks if symptoms fail to improve/resolve.  You were given pain meds in the ER - no driving for the next 6 hours.  Return to ER if worse, new symptoms, severe/intractable pain, trouble breathing, severe headache, or other concern.

## 2021-12-06 NOTE — ED Triage Notes (Signed)
BIB spouse from home s/p kicked by horse in upper abd, c/o upper abd achiness, some back pain, mentions fell onto back, usure of syncope, was unable to get up, denies NVD, bleeding, wound, kicked with back feet, redness in shape of hoof noted, no obvious bruising, swelling possible, TTP. Horse is un-shoed.

## 2021-12-10 ENCOUNTER — Other Ambulatory Visit: Payer: Medicare Other

## 2021-12-10 DIAGNOSIS — Z0000 Encounter for general adult medical examination without abnormal findings: Secondary | ICD-10-CM

## 2021-12-10 DIAGNOSIS — E785 Hyperlipidemia, unspecified: Secondary | ICD-10-CM | POA: Diagnosis not present

## 2021-12-10 DIAGNOSIS — R42 Dizziness and giddiness: Secondary | ICD-10-CM

## 2021-12-10 DIAGNOSIS — M810 Age-related osteoporosis without current pathological fracture: Secondary | ICD-10-CM | POA: Diagnosis not present

## 2021-12-10 DIAGNOSIS — R531 Weakness: Secondary | ICD-10-CM | POA: Diagnosis not present

## 2021-12-10 DIAGNOSIS — K219 Gastro-esophageal reflux disease without esophagitis: Secondary | ICD-10-CM | POA: Diagnosis not present

## 2021-12-11 ENCOUNTER — Other Ambulatory Visit: Payer: Medicare Other

## 2021-12-11 LAB — COMPREHENSIVE METABOLIC PANEL
AG Ratio: 1.9 (calc) (ref 1.0–2.5)
ALT: 11 U/L (ref 6–29)
AST: 13 U/L (ref 10–35)
Albumin: 4.5 g/dL (ref 3.6–5.1)
Alkaline phosphatase (APISO): 56 U/L (ref 37–153)
BUN: 21 mg/dL (ref 7–25)
CO2: 30 mmol/L (ref 20–32)
Calcium: 10 mg/dL (ref 8.6–10.4)
Chloride: 103 mmol/L (ref 98–110)
Creat: 0.87 mg/dL (ref 0.50–1.05)
Globulin: 2.4 g/dL (calc) (ref 1.9–3.7)
Glucose, Bld: 91 mg/dL (ref 65–99)
Potassium: 4.4 mmol/L (ref 3.5–5.3)
Sodium: 141 mmol/L (ref 135–146)
Total Bilirubin: 0.5 mg/dL (ref 0.2–1.2)
Total Protein: 6.9 g/dL (ref 6.1–8.1)

## 2021-12-11 LAB — LIPID PANEL
Cholesterol: 198 mg/dL (ref <200)
HDL: 57 mg/dL (ref >=50)
LDL Cholesterol (Calc): 119 mg/dL (calc) — ABNORMAL HIGH
Non-HDL Cholesterol (Calc): 141 mg/dL (calc) — ABNORMAL HIGH (ref <130)
Total CHOL/HDL Ratio: 3.5 (calc) (ref <5.0)
Triglycerides: 110 mg/dL (ref <150)

## 2021-12-11 LAB — CBC WITH DIFFERENTIAL/PLATELET
Absolute Monocytes: 311 cells/uL (ref 200–950)
Basophils Absolute: 20 cells/uL (ref 0–200)
Basophils Relative: 0.4 %
Eosinophils Absolute: 51 cells/uL (ref 15–500)
Eosinophils Relative: 1 %
HCT: 41 % (ref 35.0–45.0)
Hemoglobin: 13.9 g/dL (ref 11.7–15.5)
Lymphs Abs: 1295 cells/uL (ref 850–3900)
MCH: 29.5 pg (ref 27.0–33.0)
MCHC: 33.9 g/dL (ref 32.0–36.0)
MCV: 87 fL (ref 80.0–100.0)
MPV: 9.9 fL (ref 7.5–12.5)
Monocytes Relative: 6.1 %
Neutro Abs: 3422 cells/uL (ref 1500–7800)
Neutrophils Relative %: 67.1 %
Platelets: 162 10*3/uL (ref 140–400)
RBC: 4.71 10*6/uL (ref 3.80–5.10)
RDW: 13.1 % (ref 11.0–15.0)
Total Lymphocyte: 25.4 %
WBC: 5.1 10*3/uL (ref 3.8–10.8)

## 2021-12-11 LAB — VITAMIN D 25 HYDROXY (VIT D DEFICIENCY, FRACTURES): Vit D, 25-Hydroxy: 37 ng/mL (ref 30–100)

## 2021-12-15 ENCOUNTER — Ambulatory Visit (INDEPENDENT_AMBULATORY_CARE_PROVIDER_SITE_OTHER): Payer: Medicare Other | Admitting: Family Medicine

## 2021-12-15 VITALS — BP 116/58 | HR 64 | Temp 98.0°F | Ht 63.0 in | Wt 146.2 lb

## 2021-12-15 DIAGNOSIS — Z0000 Encounter for general adult medical examination without abnormal findings: Secondary | ICD-10-CM

## 2021-12-15 DIAGNOSIS — M810 Age-related osteoporosis without current pathological fracture: Secondary | ICD-10-CM | POA: Diagnosis not present

## 2021-12-15 DIAGNOSIS — Z23 Encounter for immunization: Secondary | ICD-10-CM | POA: Diagnosis not present

## 2021-12-15 DIAGNOSIS — E7800 Pure hypercholesterolemia, unspecified: Secondary | ICD-10-CM | POA: Diagnosis not present

## 2021-12-15 NOTE — Addendum Note (Signed)
Addended by: Randal Buba K on: 12/15/2021 10:13 AM   Modules accepted: Orders

## 2021-12-15 NOTE — Progress Notes (Signed)
Subjective:    Patient ID: Chelsea Griffith, female    DOB: 1955/04/14, 66 y.o.   MRN: [id number]  HPI Patient is a 66 year old Caucasian female here today for complete physical exam.  Her colonoscopy was performed in 2016 and is due in 2026.  She has had a hysterectomy and therefore does not require Pap smear.  Mammogram 06/01/21 was normal.  Dexa 3/23 showed osteoporosis in right hip.  She states that she is having a difficult time remembering to take the Fosamax.  She is also not taking calcium or vitamin D regularly.  She was recently kicked in the chest by a pony.  She is still 6 somewhat tender to palpation in the left upper quadrant of her abdomen and along her ribs below her left breast but she denies any hemoptysis.  She denies any shortness of breath.  She does have emphysema like changes on the CT scan.  She does not smoke.  I believe that she likely has asthma based on her history.  At the present time she uses albuterol just as needed.  She denies any shortness of breath with activity.  She is due for a flu shot, Prevnar 20, Shingrix, COVID booster.  She is about to have a brand-new grandchild so I did also recommend RSV vaccine Lab on 12/10/2021  Component Date Value Ref Range Status   WBC 12/10/2021 5.1  3.8 - 10.8 Thousand/uL Final   RBC 12/10/2021 4.71  3.80 - 5.10 Million/uL Final   Hemoglobin 12/10/2021 13.9  11.7 - 15.5 g/dL Final   HCT 12/10/2021 41.0  35.0 - 45.0 % Final   MCV 12/10/2021 87.0  80.0 - 100.0 fL Final   MCH 12/10/2021 29.5  27.0 - 33.0 pg Final   MCHC 12/10/2021 33.9  32.0 - 36.0 g/dL Final   RDW 12/10/2021 13.1  11.0 - 15.0 % Final   Platelets 12/10/2021 162  140 - 400 Thousand/uL Final   MPV 12/10/2021 9.9  7.5 - 12.5 fL Final   Neutro Abs 12/10/2021 3,422  1,500 - 7,800 cells/uL Final   Lymphs Abs 12/10/2021 1,295  850 - 3,900 cells/uL Final   Absolute Monocytes 12/10/2021 311  200 - 950 cells/uL Final   Eosinophils Absolute 12/10/2021 51  15 - 500  cells/uL Final   Basophils Absolute 12/10/2021 20  0 - 200 cells/uL Final   Neutrophils Relative % 12/10/2021 67.1  % Final   Total Lymphocyte 12/10/2021 25.4  % Final   Monocytes Relative 12/10/2021 6.1  % Final   Eosinophils Relative 12/10/2021 1.0  % Final   Basophils Relative 12/10/2021 0.4  % Final   Glucose, Bld 12/10/2021 91  65 - 99 mg/dL Final   Comment: .            Fasting reference interval .    BUN 12/10/2021 21  7 - 25 mg/dL Final   Creat 12/10/2021 0.87  0.50 - 1.05 mg/dL Final   BUN/Creatinine Ratio 12/10/2021 SEE NOTE:  6 - 22 (calc) Final   Comment:    Not Reported: BUN and Creatinine are within    reference range. .    Sodium 12/10/2021 141  135 - 146 mmol/L Final   Potassium 12/10/2021 4.4  3.5 - 5.3 mmol/L Final   Chloride 12/10/2021 103  98 - 110 mmol/L Final   CO2 12/10/2021 30  20 - 32 mmol/L Final   Calcium 12/10/2021 10.0  8.6 - 10.4 mg/dL Final   Total Protein 12/10/2021 6.9  6.1 - 8.1 g/dL Final   Albumin 12/10/2021 4.5  3.6 - 5.1 g/dL Final   Globulin 12/10/2021 2.4  1.9 - 3.7 g/dL (calc) Final   AG Ratio 12/10/2021 1.9  1.0 - 2.5 (calc) Final   Total Bilirubin 12/10/2021 0.5  0.2 - 1.2 mg/dL Final   Alkaline phosphatase (APISO) 12/10/2021 56  37 - 153 U/L Final   AST 12/10/2021 13  10 - 35 U/L Final   ALT 12/10/2021 11  6 - 29 U/L Final   Cholesterol 12/10/2021 198  <200 mg/dL Final   HDL 12/10/2021 57  > OR = 50 mg/dL Final   Triglycerides 12/10/2021 110  <150 mg/dL Final   LDL Cholesterol (Calc) 12/10/2021 119 (H)  mg/dL (calc) Final   Comment: Reference range: <100 . Desirable range <100 mg/dL for primary prevention;   <70 mg/dL for patients with CHD or diabetic patients  with > or = 2 CHD risk factors. Marland Kitchen LDL-C is now calculated using the Martin-Hopkins  calculation, which is a validated novel method providing  better accuracy than the Friedewald equation in the  estimation of LDL-C.  Cresenciano Genre et al. Annamaria Helling. 7353;299(24): 2061-2068   (http://education.[facility].com/faq/FAQ164)    Total CHOL/HDL Ratio 12/10/2021 3.5  <5.0 (calc) Final   Non-HDL Cholesterol (Calc) 12/10/2021 141 (H)  <130 mg/dL (calc) Final   Comment: For patients with diabetes plus 1 major ASCVD risk  factor, treating to a non-HDL-C goal of <100 mg/dL  (LDL-C of <70 mg/dL) is considered a therapeutic  option.    Vit D, 25-Hydroxy 12/10/2021 37  30 - 100 ng/mL Final   Comment: Vitamin D Status         25-OH Vitamin D: . Deficiency:                    <20 ng/mL Insufficiency:             20 - 29 ng/mL Optimal:                 > or = 30 ng/mL . For 25-OH Vitamin D testing on patients on  D2-supplementation and patients for whom quantitation  of D2 and D3 fractions is required, the Quest[facility]ureD(TM) 25-OH VIT D, (D2,D3), LC/MS/MS is recommended: order  code [id number] 5133 (patients >33yr). . See Note 1 . Note 1 . For additional information, please refer to  http://education.[facility].com/faq/FAQ199  (This link is being provided for informational/ educational purposes only.)   Admission on 12/06/2021, Discharged on 12/06/2021  Component Date Value Ref Range Status   Sodium 12/06/2021 140  135 - 145 mmol/L Final   Potassium 12/06/2021 4.2  3.5 - 5.1 mmol/L Final   Chloride 12/06/2021 103  98 - 111 mmol/L Final   CO2 12/06/2021 30  22 - 32 mmol/L Final   Glucose, Bld 12/06/2021 108 (H)  70 - 99 mg/dL Final   Glucose reference range applies only to samples taken after fasting for at least 8 hours.   BUN 12/06/2021 15  8 - 23 mg/dL Final   Creatinine, Ser 12/06/2021 0.69  0.44 - 1.00 mg/dL Final   Calcium 12/06/2021 9.5  8.9 - 10.3 mg/dL Final   Total Protein 12/06/2021 7.2  6.5 - 8.1 g/dL Final   Albumin 12/06/2021 4.4  3.5 - 5.0 g/dL Final   AST 12/06/2021 33  15 - 41 U/L Final   ALT 12/06/2021 24  0 - 44 U/L Final   Alkaline Phosphatase 12/06/2021 52  38 - 126 U/L  Final   Total Bilirubin 12/06/2021 0.8  0.3 - 1.2 mg/dL Final   GFR,  Estimated 12/06/2021 >60  >60 mL/min Final   Comment: (NOTE) Calculated using the CKD-EPI Creatinine Equation (2021)    Anion gap 12/06/2021 7  5 - 15 Final   Performed at [facility], [address] [place], [place] [address]   WBC 12/06/2021 6.5  4.0 - 10.5 K/uL Final   RBC 12/06/2021 4.70  3.87 - 5.11 MIL/uL Final   Hemoglobin 12/06/2021 13.7  12.0 - 15.0 g/dL Final   HCT 12/06/2021 40.2  36.0 - 46.0 % Final   MCV 12/06/2021 85.5  80.0 - 100.0 fL Final   MCH 12/06/2021 29.1  26.0 - 34.0 pg Final   MCHC 12/06/2021 34.1  30.0 - 36.0 g/dL Final   RDW 12/06/2021 12.5  11.5 - 15.5 % Final   Platelets 12/06/2021 169  150 - 400 K/uL Final   nRBC 12/06/2021 0.0  0.0 - 0.2 % Final   Neutrophils Relative % 12/06/2021 73  % Final   Neutro Abs 12/06/2021 4.8  1.7 - 7.7 K/uL Final   Lymphocytes Relative 12/06/2021 21  % Final   Lymphs Abs 12/06/2021 1.3  0.7 - 4.0 K/uL Final   Monocytes Relative 12/06/2021 5  % Final   Monocytes Absolute 12/06/2021 0.3  0.1 - 1.0 K/uL Final   Eosinophils Relative 12/06/2021 1  % Final   Eosinophils Absolute 12/06/2021 0.0  0.0 - 0.5 K/uL Final   Basophils Relative 12/06/2021 0  % Final   Basophils Absolute 12/06/2021 0.0  0.0 - 0.1 K/uL Final   Immature Granulocytes 12/06/2021 0  % Final   Abs Immature Granulocytes 12/06/2021 0.02  0.00 - 0.07 K/uL Final   Performed at [facility], [address] [place], [place] [address]   Lipase 12/06/2021 93 (H)  11 - 51 U/L Final   Performed at [facility], [address], [place], [place] [address]     Immunization History  Administered Date(s) Administered   Fluad Quad(high Dose 65+) 11/22/2019, 12/12/2020   Influenza,inj,Quad PF,6+ Mos 04/04/2017, 04/07/2018, 11/01/2018   PFIZER(Purple Top)SARS-COV-2 Vaccination 05/07/2019, 05/28/2019, 02/06/2020   Tdap 04/13/2013   Past Medical History:  Diagnosis Date   Anxiety    Arthritis    fingers and thumbs   Asthma    Depression    GERD (gastroesophageal reflux  disease)    History of anemia    Hyperlipidemia    Osteoporosis    right hip   Rotator cuff tear, right    Past Surgical History:  Procedure Laterality Date   ABDOMINAL HYSTERECTOMY     CESAREAN SECTION      2 times   COLONOSCOPY     DILATION AND CURETTAGE OF UTERUS     several   ELBOW SURGERY Bilateral     2times/1 time on left elbow   KNEE SURGERY     right knee   PARTIAL HYSTERECTOMY     2 times   REVERSE SHOULDER ARTHROPLASTY Right 09/06/2018   Procedure: REVERSE SHOULDER ARTHROPLASTY;  Surgeon: Hiram Gash, MD;  Location: [facility];  Service: Orthopedics;  Laterality: Right;   ROTATOR CUFF REPAIR     right shoulderx2    Current Outpatient Medications on File Prior to Visit  Medication Sig Dispense Refill   albuterol (PROAIR HFA) 108 (90 Base) MCG/ACT inhaler Inhale 2 puffs into the lungs every 4 (four) hours as needed for wheezing or shortness of breath. 18 g 2  alendronate (FOSAMAX) 70 MG tablet Take 1 tablet (70 mg total) by mouth every 7 (seven) days. Take with a full glass of water on an empty stomach. 4 tablet 11   cholecalciferol (VITAMIN D3) 25 MCG (1000 UNIT) tablet Take 1,000 Units by mouth daily.     clonazePAM (KLONOPIN) 1 MG tablet Take 1 mg by mouth every evening.      FLUoxetine (PROZAC) 10 MG tablet Take 20 mg by mouth daily.     fluticasone (FLONASE) 50 MCG/ACT nasal spray PLACE 2 SPRAYS INTO EACH NOSTRIL DAILY. 48 mL 1   levocetirizine (XYZAL) 5 MG tablet TAKE 1 TABLET BY MOUTH EVERY DAY IN THE EVENING 90 tablet 3   montelukast (SINGULAIR) 10 MG tablet TAKE 1 TABLET BY MOUTH EVERYDAY AT BEDTIME (Patient not taking: Reported on 09/01/2021) 90 tablet 3   Multiple Vitamin (MULTIVITAMIN WITH MINERALS) TABS tablet Take 2 tablets by mouth daily.     Multiple Vitamins-Minerals (PRESERVISION AREDS 2) CAPS Take 1 capsule by mouth 2 (two) times a day.     pantoprazole (PROTONIX) 40 MG tablet TAKE 1 TABLET BY MOUTH EVERY DAY 90 tablet 1   pravastatin (PRAVACHOL) 20 MG  tablet TAKE 1 TABLET BY MOUTH EVERY DAY 90 tablet 1   predniSONE (DELTASONE) 50 MG tablet Take 50 mg by mouth daily. (Patient not taking: Reported on 09/01/2021)     scopolamine (TRANSDERM SCOP, 1.5 MG,) 1 MG/3DAYS Place 1 patch (1.5 mg total) onto the skin every 3 (three) days. 2 patch 0   traZODone (DESYREL) 100 MG tablet Take 100 mg by mouth at bedtime.      No current facility-administered medications on file prior to visit.   Allergies  Allergen Reactions   Codeine Itching   Milk-Related Compounds Nausea And Vomiting    Just milk, as an infant dropped significant amount of weight    Other     Smells may cause asthma atack   Social History   Socioeconomic History   Marital status: Married    Spouse name: Not on file   Number of children: Not on file   Years of education: Not on file   Highest education level: Not on file  Occupational History   Not on file  Tobacco Use   Smoking status: Never   Smokeless tobacco: Never  Vaping Use   Vaping Use: Never used  Substance and Sexual Activity   Alcohol use: Yes    Alcohol/week: 1.0 standard drink of alcohol    Types: 1 Shots of liquor per week    Comment: occasional weekly drink   Drug use: No   Sexual activity: Yes    Comment: single  Other Topics Concern   Not on file  Social History Narrative   Not on file   Social Determinants of Health   Financial Resource Strain: Not on file  Food Insecurity: Not on file  Transportation Needs: Not on file  Physical Activity: Not on file  Stress: Not on file  Social Connections: Not on file  Intimate Partner Violence: Not on file   Family History  Problem Relation Age of Onset   Hyperlipidemia Mother    ADD / ADHD Son    Heart disease Maternal Grandmother    Colon cancer Neg Hx    Esophageal cancer Neg Hx    Rectal cancer Neg Hx    Stomach cancer Neg Hx       Review of Systems  All other systems reviewed and are negative.  Objective:   Physical Exam Vitals  reviewed.  Constitutional:      General: She is not in acute distress.    Appearance: She is well-developed. She is not diaphoretic.  HENT:     Head: Normocephalic and atraumatic.     Right Ear: External ear normal.     Left Ear: External ear normal.     Nose: Nose normal.     Mouth/Throat:     Pharynx: No oropharyngeal exudate.  Eyes:     General: No scleral icterus.       Right eye: No discharge.        Left eye: No discharge.     Conjunctiva/sclera: Conjunctivae normal.     Pupils: Pupils are equal, round, and reactive to light.  Neck:     Thyroid: No thyromegaly.     Vascular: No JVD.     Trachea: No tracheal deviation.  Cardiovascular:     Rate and Rhythm: Normal rate and regular rhythm.     Heart sounds: Normal heart sounds. No murmur heard.    No friction rub. No gallop.  Pulmonary:     Effort: Pulmonary effort is normal. No respiratory distress.     Breath sounds: Normal breath sounds. No stridor. No wheezing or rales.  Chest:     Chest wall: No tenderness.  Abdominal:     General: Bowel sounds are normal. There is no distension.     Palpations: Abdomen is soft. There is no mass.     Tenderness: There is no abdominal tenderness. There is no guarding or rebound.  Musculoskeletal:        General: No tenderness or deformity. Normal range of motion.     Cervical back: Normal range of motion and neck supple.  Lymphadenopathy:     Cervical: No cervical adenopathy.  Skin:    General: Skin is warm.     Coloration: Skin is not pale.     Findings: No erythema or rash.  Neurological:     Mental Status: She is alert and oriented to person, place, and time.     Cranial Nerves: No cranial nerve deficit.     Motor: No abnormal muscle tone.     Coordination: Coordination normal.     Deep Tendon Reflexes: Reflexes are normal and symmetric.  Psychiatric:        Behavior: Behavior normal.        Thought Content: Thought content normal.        Judgment: Judgment normal.            Assessment & Plan:  Routine general medical examination at a health care facility  Osteoporosis without current pathological fracture, unspecified osteoporosis type  Pure hypercholesterolemia Physical exam today is normal.  Given that she will be around the baby during RSV season I recommended an RSV vaccination.  Also recommended a COVID booster.  She received her flu shot today as well as Prevnar 20.  Also recommended Shingrix at her convenience.  Colonoscopy is up-to-date.  Mammogram is up-to-date.  I encouraged the patient to be consistent with her Fosamax, 1200 mg a day of calcium, 1000 units a day of vitamin D.  The remainder of her preventative care is up-to-date and her lab work is acceptable.  Her psychiatrist is retiring.  I explained to the patient that I will be willing to refill her Prozac, trazodone, and Klonopin that she takes once a day whenever necessary.  I believe the doses she is taking are  reasonable and I will be glad to refill them.

## 2021-12-25 ENCOUNTER — Other Ambulatory Visit: Payer: Self-pay

## 2021-12-25 ENCOUNTER — Telehealth: Payer: Self-pay

## 2021-12-25 ENCOUNTER — Other Ambulatory Visit: Payer: Self-pay | Admitting: Family Medicine

## 2021-12-25 DIAGNOSIS — F32A Depression, unspecified: Secondary | ICD-10-CM

## 2021-12-25 MED ORDER — FLUOXETINE HCL 10 MG PO TABS: 20.0000 mg | ORAL_TABLET | Freq: Every day | ORAL | 3 refills | Status: DC

## 2021-12-25 NOTE — Telephone Encounter (Signed)
Received eFax from pharmacy to request refill of or new script for  FLUoxetine (PROZAC) 10 MG tablet [[id number]]    Order Details Dose: 20 mg Route: Oral Frequency: Daily  Dispense Quantity: -- Refills: --        Sig: Take 20 mg by mouth daily.       Start Date: -- End Date: --  Written Date: -- Expiration Date: --  Ordering Date: 08/25/18     [id number] [place]  LOV: CPE 12/15/21  Please advise pharmacist.

## 2021-12-28 NOTE — Telephone Encounter (Signed)
Requested medication (s) are due for refill today: new request  Requested medication (s) are on the active medication list: yes  Last refill:  12/25/21 #90 3 refills  Future visit scheduled: no  Notes to clinic:  Pharmacy comment: Alternative Requested:INSUANCE ONLY COVERS CAPSULES . Please order capsules     Requested Prescriptions  Pending Prescriptions Disp Refills   FLUoxetine (PROZAC) 10 MG tablet [Pharmacy Med Name: FLUOXETINE HCL 10 MG TABLET] 90 tablet 3    Sig: TAKE 2 TABLETS BY MOUTH EVERY DAY     Psychiatry:  Antidepressants - SSRI Failed - 12/25/2021  5:45 PM      Failed - Valid encounter within last 6 months    Recent Outpatient Visits           9 months ago Upper airway cough syndrome   [place] Dennard Schaumann, Cammie Mcgee, MD   10 months ago [place] Susy Frizzle, MD   10 months ago Viral upper respiratory tract infection   [place] Eulogio Bear, NP   1 year ago Osteopenia, unspecified location   [place] Pickard, Cammie Mcgee, MD   1 year ago [place] Pickard, Cammie Mcgee, MD              Passed - Completed PHQ-2 or PHQ-9 in the last 360 days

## 2021-12-29 NOTE — Telephone Encounter (Signed)
Spoke w/pt this morning, told pt that [facility] [address]. Pt voiced understanding.   Nothing further.

## 2021-12-29 NOTE — Telephone Encounter (Signed)
Patient called to follow up on refill request for Fluoxetine; stated the pharmacy notified her they haven't received the refill request and they've been sending it to Dr. Launa Flight, her psychiatrist who has already retired.  Patient stated the pharmacy needs our office to notify them we're taking over the prescription due to her previous doctor's retirement.   Patient also needs two additional meds transferred to Dr. Dennard Schaumann from same provider:  - clonazePAM (KLONOPIN) 1 MG tablet [[id number]]  - traZODone (DESYREL) 100 MG tablet [[id number]]   She currently has some on hand and doesn't need those two filled at the moment.  Ph[facility]acy confirmed [facility]   [id number] [place] [place] Phone:  [phone] Fax:  [phone] DEA #:  [id number] Please advise patient at [phone]

## 2022-01-14 ENCOUNTER — Other Ambulatory Visit: Payer: Self-pay | Admitting: *Deleted

## 2022-01-14 ENCOUNTER — Other Ambulatory Visit: Payer: Self-pay | Admitting: Family Medicine

## 2022-01-14 MED ORDER — CLONAZEPAM 1 MG PO TABS: 1.0000 mg | ORAL_TABLET | Freq: Every evening | ORAL | 2 refills | Status: DC

## 2022-03-02 ENCOUNTER — Other Ambulatory Visit: Payer: Self-pay

## 2022-03-02 DIAGNOSIS — E7800 Pure hypercholesterolemia, unspecified: Secondary | ICD-10-CM

## 2022-03-02 MED ORDER — PRAVASTATIN SODIUM 20 MG PO TABS: 20.0000 mg | ORAL_TABLET | Freq: Every day | ORAL | 3 refills | Status: DC

## 2022-04-01 ENCOUNTER — Other Ambulatory Visit: Payer: Self-pay

## 2022-04-01 DIAGNOSIS — F32A Depression, unspecified: Secondary | ICD-10-CM

## 2022-04-01 MED ORDER — FLUOXETINE HCL 10 MG PO TABS: 20.0000 mg | ORAL_TABLET | Freq: Every day | ORAL | 3 refills | Status: DC

## 2022-04-15 ENCOUNTER — Other Ambulatory Visit: Payer: Self-pay | Admitting: Family Medicine

## 2022-04-15 DIAGNOSIS — J45901 Unspecified asthma with (acute) exacerbation: Secondary | ICD-10-CM

## 2022-04-15 DIAGNOSIS — J069 Acute upper respiratory infection, unspecified: Secondary | ICD-10-CM

## 2022-04-15 NOTE — Telephone Encounter (Signed)
Requested medication (s) are due for refill today: yes  Requested medication (s) are on the active medication list: yes  Last refill:  01/14/22 #30 2 refills  Future visit scheduled: no   Notes to clinic:  not delegated per protocol. Do you want to refill Rx?     Requested Prescriptions  Pending Prescriptions Disp Refills   clonazePAM (KLONOPIN) 1 MG tablet [Pharmacy Med Name: CLONAZEPAM 1 MG TABLET] 30 tablet 2    Sig: TAKE 1 TABLET BY MOUTH EVERY EVENING.     Not Delegated - Psychiatry: Anxiolytics/Hypnotics 2 Failed - 04/15/2022 12:19 PM      Failed - This refill cannot be delegated      Failed - Urine Drug Screen completed in last 360 days      Failed - Valid encounter within last 6 months    Recent Outpatient Visits           1 year ago Upper airway cough syndrome   [place] Pickard, Cammie Mcgee, MD   1 year ago [place] Dennard Schaumann, Cammie Mcgee, MD   1 year ago Viral upper respiratory tract infection   [place] Eulogio Bear, NP   1 year ago Osteopenia, unspecified location   [facility] Pickard, Cammie Mcgee, MD   2 years ago [place] Pickard, Cammie Mcgee, MD              Passed - Patient is not pregnant      Signed Prescriptions Disp Refills   montelukast (SINGULAIR) 10 MG tablet 90 tablet 0    Sig: TAKE 1 TABLET BY MOUTH EVERYDAY AT BEDTIME     Pulmonology:  Leukotriene Inhibitors Failed - 04/15/2022 12:19 PM      Failed - Valid encounter within last 12 months    Recent Outpatient Visits           1 year ago Upper airway cough syndrome   [place] Susy Frizzle, MD   1 year ago [place] Susy Frizzle, MD   1 year ago Viral upper respiratory tract infection   [place] Eulogio Bear, NP   1 year ago Osteopenia, unspecified location   [facility] Pickard, Cammie Mcgee, MD   2 years ago Rhinosinusitis   [place] Pickard, Cammie Mcgee, MD

## 2022-04-15 NOTE — Telephone Encounter (Signed)
Last OV 12/15/21.  Requested Prescriptions  Pending Prescriptions Disp Refills   montelukast (SINGULAIR) 10 MG tablet [Pharmacy Med Name: MONTELUKAST SOD 10 MG TABLET] 90 tablet 0    Sig: TAKE 1 TABLET BY MOUTH EVERYDAY AT BEDTIME     Pulmonology:  Leukotriene Inhibitors Failed - 04/15/2022 12:19 PM      Failed - Valid encounter within last 12 months    Recent Outpatient Visits           1 year ago Upper airway cough syndrome   [place] Susy Frizzle, MD   1 year ago [place] Susy Frizzle, MD   1 year ago Viral upper respiratory tract infection   [place] Eulogio Bear, NP   1 year ago Osteopenia, unspecified location   [place] Pickard, Cammie Mcgee, MD   2 years ago Rhinosinusitis   [place] Pickard, Cammie Mcgee, MD               clonazePAM (KLONOPIN) 1 MG tablet [Pharmacy Med Name: CLONAZEPAM 1 MG TABLET] 30 tablet 2    Sig: TAKE 1 TABLET BY MOUTH EVERY EVENING.     Not Delegated - Psychiatry: Anxiolytics/Hypnotics 2 Failed - 04/15/2022 12:19 PM      Failed - This refill cannot be delegated      Failed - Urine Drug Screen completed in last 360 days      Failed - Valid encounter within last 6 months    Recent Outpatient Visits           1 year ago Upper airway cough syndrome   [place] Dennard Schaumann, Cammie Mcgee, MD   1 year ago [place] Susy Frizzle, MD   1 year ago Viral upper respiratory tract infection   [place] Eulogio Bear, NP   1 year ago Osteopenia, unspecified location   [place] Pickard, Cammie Mcgee, MD   2 years ago [place], Cammie Mcgee, MD              Passed - Patient is not pregnant

## 2022-04-19 ENCOUNTER — Other Ambulatory Visit: Payer: Self-pay | Admitting: Family Medicine

## 2022-04-19 DIAGNOSIS — H2511 Age-related nuclear cataract, right eye: Secondary | ICD-10-CM | POA: Diagnosis not present

## 2022-04-19 DIAGNOSIS — Z1231 Encounter for screening mammogram for malignant neoplasm of breast: Secondary | ICD-10-CM

## 2022-04-20 DIAGNOSIS — H2512 Age-related nuclear cataract, left eye: Secondary | ICD-10-CM | POA: Diagnosis not present

## 2022-04-22 DIAGNOSIS — H2511 Age-related nuclear cataract, right eye: Secondary | ICD-10-CM | POA: Diagnosis not present

## 2022-04-28 ENCOUNTER — Other Ambulatory Visit: Payer: Self-pay | Admitting: Family Medicine

## 2022-04-28 DIAGNOSIS — F32A Depression, unspecified: Secondary | ICD-10-CM

## 2022-05-03 DIAGNOSIS — H52202 Unspecified astigmatism, left eye: Secondary | ICD-10-CM | POA: Diagnosis not present

## 2022-05-03 DIAGNOSIS — H2512 Age-related nuclear cataract, left eye: Secondary | ICD-10-CM | POA: Diagnosis not present

## 2022-05-06 NOTE — Telephone Encounter (Signed)
Pharmacy sent efax to request a revised script.  Patient's insurance will cover 1 tab/cap or '20MG'$  per day, not 2 tabs of '10MG'$ .   Requesting new script for '20MG'$  QD.   [facility]  [id number] [place] [place]: [phone] Fax: [phone] #: [id number]   Please advise pharmacist at [phone] fax [phone]

## 2022-05-07 ENCOUNTER — Ambulatory Visit (INDEPENDENT_AMBULATORY_CARE_PROVIDER_SITE_OTHER): Payer: Medicare Other | Admitting: Family Medicine

## 2022-05-07 ENCOUNTER — Encounter: Payer: Self-pay | Admitting: Family Medicine

## 2022-05-07 VITALS — BP 120/76 | HR 69 | Temp 97.9°F | Ht 63.0 in | Wt 144.0 lb

## 2022-05-07 DIAGNOSIS — F32A Depression, unspecified: Secondary | ICD-10-CM | POA: Diagnosis not present

## 2022-05-07 MED ORDER — FLUOXETINE HCL 20 MG PO CAPS: 20.0000 mg | ORAL_CAPSULE | Freq: Every day | ORAL | 3 refills | Status: DC

## 2022-05-07 MED ORDER — BUPROPION HCL ER (XL) 150 MG PO TB24: 150.0000 mg | ORAL_TABLET | Freq: Every day | ORAL | 5 refills | Status: DC

## 2022-05-07 NOTE — Progress Notes (Signed)
Subjective:    Patient ID: Chelsea Griffith, female    DOB: [birth year], 67 y.o.   MRN: [id number]  HPI Patient is dealing with depression.  She states for the last 6 months she has been dealing with more mood swings, agitation, getting angry more easily.  She states that she is taking it out on her husband inappropriately by yelling at him.  She denies any suicidal thoughts.  She denies any hallucinations.  She denies any delusions.  She denies trouble sleeping.  But she does report anhedonia.  She reports feeling sad.  She is tearful throughout the encounter today.  She has been on Prozac 20 mg for years. Past Medical History:  Diagnosis Date   Anxiety    Arthritis    fingers and thumbs   Asthma    Depression    GERD (gastroesophageal reflux disease)    History of anemia    Hyperlipidemia    Osteoporosis    right hip   Rotator cuff tear, right    Past Surgical History:  Procedure Laterality Date   ABDOMINAL HYSTERECTOMY     CESAREAN SECTION      2 times   COLONOSCOPY     DILATION AND CURETTAGE OF UTERUS     several   ELBOW SURGERY Bilateral     2times/1 time on left elbow   KNEE SURGERY     right knee   PARTIAL HYSTERECTOMY     2 times   REVERSE SHOULDER ARTHROPLASTY Right 09/06/2018   Procedure: REVERSE SHOULDER ARTHROPLASTY;  Surgeon: Hiram Gash, MD;  Location: [facility];  Service: Orthopedics;  Laterality: Right;   ROTATOR CUFF REPAIR     right shoulderx2    Current Outpatient Medications on File Prior to Visit  Medication Sig Dispense Refill   albuterol (PROAIR HFA) 108 (90 Base) MCG/ACT inhaler Inhale 2 puffs into the lungs every 4 (four) hours as needed for wheezing or shortness of breath. 18 g 2   cholecalciferol (VITAMIN D3) 25 MCG (1000 UNIT) tablet Take 1,000 Units by mouth daily.     clonazePAM (KLONOPIN) 1 MG tablet TAKE 1 TABLET BY MOUTH EVERY EVENING. 30 tablet 2   fluticasone (FLONASE) 50 MCG/ACT nasal spray PLACE 2 SPRAYS INTO EACH NOSTRIL DAILY. 48 mL 1    levocetirizine (XYZAL) 5 MG tablet TAKE 1 TABLET BY MOUTH EVERY DAY IN THE EVENING 90 tablet 3   montelukast (SINGULAIR) 10 MG tablet TAKE 1 TABLET BY MOUTH EVERYDAY AT BEDTIME 90 tablet 0   Multiple Vitamin (MULTIVITAMIN WITH MINERALS) TABS tablet Take 2 tablets by mouth daily.     Multiple Vitamins-Minerals (PRESERVISION AREDS 2) CAPS Take 1 capsule by mouth 2 (two) times a day.     pravastatin (PRAVACHOL) 20 MG tablet Take 1 tablet (20 mg total) by mouth daily. 90 tablet 3   scopolamine (TRANSDERM SCOP, 1.5 MG,) 1 MG/3DAYS Place 1 patch (1.5 mg total) onto the skin every 3 (three) days. 2 patch 0   traZODone (DESYREL) 100 MG tablet Take 100 mg by mouth at bedtime.      No current facility-administered medications on file prior to visit.   Allergies  Allergen Reactions   Codeine Itching   Milk-Related Compounds Nausea And Vomiting    Just milk, as an infant dropped significant amount of weight    Other     Smells may cause asthma atack   Social History   Socioeconomic History   Marital status: Married  Spouse name: Not on file   Number of children: Not on file   Years of education: Not on file   Highest education level: Not on file  Occupational History   Not on file  Tobacco Use   Smoking status: Never   Smokeless tobacco: Never  Vaping Use   Vaping Use: Never used  Substance and Sexual Activity   Alcohol use: Yes    Alcohol/week: 1.0 standard drink of alcohol    Types: 1 Shots of liquor per week    Comment: occasional weekly drink   Drug use: No   Sexual activity: Yes    Comment: single  Other Topics Concern   Not on file  Social History Narrative   Not on file   Social Determinants of Health   Financial Resource Strain: Not on file  Food Insecurity: Not on file  Transportation Needs: Not on file  Physical Activity: Not on file  Stress: Not on file  Social Connections: Not on file  Intimate Partner Violence: Not on file      Review of Systems  All  other systems reviewed and are negative.      Objective:   Physical Exam Constitutional:      Appearance: Normal appearance. She is normal weight.  Cardiovascular:     Rate and Rhythm: Normal rate and regular rhythm.  Pulmonary:     Effort: Pulmonary effort is normal.     Breath sounds: Normal breath sounds.  Neurological:     General: No focal deficit present.     Mental Status: She is alert and oriented to person, place, and time.  Psychiatric:        Attention and Perception: Attention normal.        Mood and Affect: Affect is tearful.        Speech: Speech normal.        Behavior: Behavior normal.        Thought Content: Thought content normal.        Cognition and Memory: Cognition and memory normal.        Judgment: Judgment normal.           Assessment & Plan:   Depression, unspecified depression type Patient is dealing with worsening depression.  Continue Prozac 20 mg a day and add Wellbutrin XL 150 mg p.o. daily.  Reassess in 4 to 6 weeks or sooner if worse.

## 2022-05-10 ENCOUNTER — Telehealth: Payer: Self-pay

## 2022-05-10 NOTE — Telephone Encounter (Signed)
Pt came into office to ask if a refill of this med clonazePAM (KLONOPIN) 1 MG tablet [id number] could be called into pharmacy. Pt stated that the pharmacy would not refill because it was too soon to be refilled. Pt stated that she will going out of town and that she will be out of this med by the weekend. Pt would like to see if this could be called in as she does not want to run out of this med please. Please advise.  Cb#: [phone]  [facility] [address]  LOV: 05/07/22

## 2022-05-11 ENCOUNTER — Other Ambulatory Visit: Payer: Self-pay | Admitting: Family Medicine

## 2022-05-11 MED ORDER — CLONAZEPAM 1 MG PO TABS: 1.0000 mg | ORAL_TABLET | Freq: Every evening | ORAL | 2 refills | Status: DC

## 2022-05-21 ENCOUNTER — Other Ambulatory Visit: Payer: Self-pay | Admitting: Family Medicine

## 2022-05-21 ENCOUNTER — Telehealth: Payer: Self-pay | Admitting: Family Medicine

## 2022-05-21 MED ORDER — TRAZODONE HCL 100 MG PO TABS: 100.0000 mg | ORAL_TABLET | Freq: Every evening | ORAL | 1 refills | Status: DC | PRN

## 2022-05-21 NOTE — Telephone Encounter (Signed)
Prescription Request  05/21/2022  LOV: 05/07/2022  What is the name of the medication or equipment?   traZODone (DESYREL) 100 MG tablet [id number]  *PATIENT STATED THIS IS THE LAST OF THE MEDICATIONS ORIGINALLY PRESCRIBED BY DR. Toy Cookey WHO HAS RETIRED** **PATIENT STATED SHE TOOK HER LAST PILL TWO DAYS AGO**  Have you contacted your pharmacy to request a refill? Yes   Which pharmacy would you like this sent to?  [id number] [address], [address] [place] [address] Phone: [phone] Fax: [phone]    Patient notified that their request is being sent to the clinical staff for review and that they should receive a response within 2 business days.   Please advise patient at [phone].

## 2022-05-23 ENCOUNTER — Other Ambulatory Visit: Payer: Self-pay | Admitting: Family Medicine

## 2022-05-23 DIAGNOSIS — M816 Localized osteoporosis [Lequesne]: Secondary | ICD-10-CM

## 2022-05-29 ENCOUNTER — Other Ambulatory Visit: Payer: Self-pay | Admitting: Family Medicine

## 2022-05-31 ENCOUNTER — Telehealth: Payer: Self-pay

## 2022-05-31 ENCOUNTER — Other Ambulatory Visit: Payer: Self-pay

## 2022-05-31 DIAGNOSIS — J329 Chronic sinusitis, unspecified: Secondary | ICD-10-CM

## 2022-05-31 MED ORDER — FLUTICASONE PROPIONATE 50 MCG/ACT NA SUSP: NASAL | 2 refills | Status: DC

## 2022-05-31 NOTE — Telephone Encounter (Signed)
Prescription Request  05/31/2022  LOV: 05/07/22  What is the name of the medication or equipment? fluticasone (FLONASE) 50 MCG/ACT nasal spray [id number]  Have you contacted your pharmacy to request a refill? Yes   Which pharmacy would you like this sent to?  [id number] [address], [address] [place] [address] Phone: [phone] Fax: [phone]    Patient notified that their request is being sent to the clinical staff for review and that they should receive a response within 2 business days.   Please advise at [facility] [phone]

## 2022-05-31 NOTE — Telephone Encounter (Signed)
Requested medications are due for refill today.  no  Requested medications are on the active medications list.  yes  Last refill. 05/07/2022 #30 6 rf  Future visit scheduled.   no  Notes to clinic.  Pt requesting 90 day supply. Please advise.    Requested Prescriptions  Pending Prescriptions Disp Refills   buPROPion (WELLBUTRIN XL) 150 MG 24 hr tablet [Pharmacy Med Name: BUPROPION HCL XL 150 MG TABLET] 90 tablet 2    Sig: TAKE 1 TABLET BY MOUTH EVERY DAY     Psychiatry: Antidepressants - bupropion Failed - 05/29/2022  1:32 PM      Failed - Valid encounter within last 6 months    Recent Outpatient Visits           1 year ago Upper airway cough syndrome   [place] Susy Frizzle, MD   1 year ago [place] Susy Frizzle, MD   1 year ago Viral upper respiratory tract infection   [place] Eulogio Bear, NP   1 year ago Osteopenia, unspecified location   [place] Pickard, Cammie Mcgee, MD   2 years ago [place] Pickard, Cammie Mcgee, MD              Passed - Cr in normal range and within 360 days    Creat  Date Value Ref Range Status  12/10/2021 0.87 0.50 - 1.05 mg/dL Final         Passed - AST in normal range and within 360 days    AST  Date Value Ref Range Status  12/10/2021 13 10 - 35 U/L Final         Passed - ALT in normal range and within 360 days    ALT  Date Value Ref Range Status  12/10/2021 11 6 - 29 U/L Final         Passed - Completed PHQ-2 or PHQ-9 in the last 360 days      Passed - Last BP in normal range    BP Readings from Last 1 Encounters:  05/07/22 120/76

## 2022-06-04 ENCOUNTER — Ambulatory Visit
Admission: RE | Admit: 2022-06-04 | Discharge: 2022-06-04 | Disposition: A | Discharge status: Home / Self Care | Payer: Medicare Other | Source: Ambulatory Visit | Attending: Family Medicine | Admitting: Family Medicine

## 2022-06-04 DIAGNOSIS — Z1231 Encounter for screening mammogram for malignant neoplasm of breast: Secondary | ICD-10-CM

## 2022-06-15 ENCOUNTER — Other Ambulatory Visit: Payer: Self-pay | Admitting: Family Medicine

## 2022-06-22 ENCOUNTER — Ambulatory Visit (INDEPENDENT_AMBULATORY_CARE_PROVIDER_SITE_OTHER): Payer: Medicare Other | Admitting: Family Medicine

## 2022-06-22 VITALS — BP 118/64 | HR 69 | Temp 98.5°F | Ht 63.0 in | Wt 146.8 lb

## 2022-06-22 DIAGNOSIS — J329 Chronic sinusitis, unspecified: Secondary | ICD-10-CM | POA: Diagnosis not present

## 2022-06-22 MED ORDER — ALENDRONATE SODIUM 10 MG PO TABS: 10.0000 mg | ORAL_TABLET | Freq: Every day | ORAL | 11 refills | Status: DC

## 2022-06-22 MED ORDER — PREDNISONE 20 MG PO TABS: ORAL_TABLET | ORAL | 0 refills | Status: DC

## 2022-06-22 NOTE — Progress Notes (Signed)
Subjective:    Patient ID: Chelsea Griffith, female    DOB: 1955-03-10, 67 y.o.   MRN: [id number]  Patient has a history of allergies.  She is on Xyzal and Singulair daily.  3 days ago she started Flonase.  She reports yellow mucus and bloody mucus coming from both nostrils.  She reports postnasal drip and drainage and head congestion.  Symptoms were so bad earlier that they had to miss a trip to [place] because she could not stop coughing due to postnasal drip and drainage.  She denies any fever.  She denies any sinus pain.  She denies any headache Past Medical History:  Diagnosis Date   Anxiety    Arthritis    fingers and thumbs   Asthma    Depression    GERD (gastroesophageal reflux disease)    History of anemia    Hyperlipidemia    Osteoporosis    right hip   Rotator cuff tear, right    Past Surgical History:  Procedure Laterality Date   ABDOMINAL HYSTERECTOMY     CESAREAN SECTION      2 times   COLONOSCOPY     DILATION AND CURETTAGE OF UTERUS     several   ELBOW SURGERY Bilateral     2times/1 time on left elbow   KNEE SURGERY     right knee   PARTIAL HYSTERECTOMY     2 times   REVERSE SHOULDER ARTHROPLASTY Right 09/06/2018   Procedure: REVERSE SHOULDER ARTHROPLASTY;  Surgeon: Bjorn Pippin, MD;  Location: [facility];  Service: Orthopedics;  Laterality: Right;   ROTATOR CUFF REPAIR     right shoulderx2    Current Outpatient Medications on File Prior to Visit  Medication Sig Dispense Refill   albuterol (PROAIR HFA) 108 (90 Base) MCG/ACT inhaler Inhale 2 puffs into the lungs every 4 (four) hours as needed for wheezing or shortness of breath. 18 g 2   buPROPion (WELLBUTRIN XL) 150 MG 24 hr tablet TAKE 1 TABLET BY MOUTH EVERY DAY 90 tablet 2   cholecalciferol (VITAMIN D3) 25 MCG (1000 UNIT) tablet Take 1,000 Units by mouth daily.     clonazePAM (KLONOPIN) 1 MG tablet Take 1 tablet (1 mg total) by mouth every evening. 30 tablet 2   FLUoxetine (PROZAC) 20 MG capsule Take 1  capsule (20 mg total) by mouth daily. 90 capsule 3   fluticasone (FLONASE) 50 MCG/ACT nasal spray PLACE 2 SPRAYS INTO EACH NOSTRIL DAILY. 48 mL 2   levocetirizine (XYZAL) 5 MG tablet TAKE 1 TABLET BY MOUTH EVERY DAY IN THE EVENING 90 tablet 3   montelukast (SINGULAIR) 10 MG tablet TAKE 1 TABLET BY MOUTH EVERYDAY AT BEDTIME 90 tablet 0   Multiple Vitamin (MULTIVITAMIN WITH MINERALS) TABS tablet Take 2 tablets by mouth daily.     Multiple Vitamins-Minerals (PRESERVISION AREDS 2) CAPS Take 1 capsule by mouth 2 (two) times a day.     pravastatin (PRAVACHOL) 20 MG tablet Take 1 tablet (20 mg total) by mouth daily. 90 tablet 3   scopolamine (TRANSDERM SCOP, 1.5 MG,) 1 MG/3DAYS Place 1 patch (1.5 mg total) onto the skin every 3 (three) days. 2 patch 0   traZODone (DESYREL) 100 MG tablet Take 1 tablet (100 mg total) by mouth at bedtime as needed for sleep. 30 tablet 1   No current facility-administered medications on file prior to visit.   Allergies  Allergen Reactions   Codeine Itching   Milk-Related Compounds Nausea And Vomiting  Just milk, as an infant dropped significant amount of weight    Other     Smells may cause asthma atack   Social History   Socioeconomic History   Marital status: Married    Spouse name: Not on file   Number of children: Not on file   Years of education: Not on file   Highest education level: Bachelor's degree (e.g., BA, AB, BS)  Occupational History   Not on file  Tobacco Use   Smoking status: Never   Smokeless tobacco: Never  Vaping Use   Vaping Use: Never used  Substance and Sexual Activity   Alcohol use: Yes    Alcohol/week: 1.0 standard drink of alcohol    Types: 1 Shots of liquor per week    Comment: occasional weekly drink   Drug use: No   Sexual activity: Yes    Comment: single  Other Topics Concern   Not on file  Social History Narrative   Not on file   Social Determinants of Health   Financial Resource Strain: Low Risk  (06/22/2022)    Overall Financial Resource Strain (CARDIA)    Difficulty of Paying Living Expenses: Not hard at all  Food Insecurity: No Food Insecurity (06/22/2022)   Hunger Vital Sign    Worried About Running Out of Food in the Last Year: Never true    Ran Out of Food in the Last Year: Never true  Transportation Needs: No Transportation Needs (06/22/2022)   PRAPARE - Administrator, Civil Service (Medical): No    Lack of Transportation (Non-Medical): No  Physical Activity: Insufficiently Active (06/22/2022)   Exercise Vital Sign    Days of Exercise per Week: 1 day    Minutes of Exercise per Session: 10 min  Stress: No Stress Concern Present (06/22/2022)   [employer] of Occupational Health - Occupational Stress Questionnaire    Feeling of Stress : Not at all  Social Connections: Moderately Integrated (06/22/2022)   Social Connection and Isolation Panel [NHANES]    Frequency of Communication with Friends and Family: Three times a week    Frequency of Social Gatherings with Friends and Family: Twice a week    Attends Religious Services: More than 4 times per year    Active Member of [employer] or Organizations: No    Attends Engineer, structural: Not on file    Marital Status: Married  Catering manager Violence: Not on file      Review of Systems  All other systems reviewed and are negative.      Objective:   Physical Exam Vitals reviewed.  Constitutional:      General: She is not in acute distress.    Appearance: She is well-developed. She is not diaphoretic.  HENT:     Right Ear: Tympanic membrane, ear canal and external ear normal.     Left Ear: Tympanic membrane, ear canal and external ear normal.     Nose: Congestion and rhinorrhea present.     Right Sinus: No maxillary sinus tenderness or frontal sinus tenderness.     Left Sinus: No maxillary sinus tenderness or frontal sinus tenderness.     Mouth/Throat:     Pharynx: No oropharyngeal exudate or posterior  oropharyngeal erythema.  Eyes:     Conjunctiva/sclera: Conjunctivae normal.  Cardiovascular:     Rate and Rhythm: Normal rate and regular rhythm.     Heart sounds: Normal heart sounds. No murmur heard. Pulmonary:     Effort: Pulmonary  effort is normal.     Breath sounds: No decreased breath sounds, wheezing, rhonchi or rales.  Chest:     Chest wall: No tenderness.  Musculoskeletal:     Cervical back: Neck supple.  Lymphadenopathy:     Cervical: No cervical adenopathy.           Assessment & Plan:  Rhinosinusitis Patient is dealing with sinusitis but I believe is due to allergies.  Use a prednisone taper pack and begin to consistently take the Flonase daily.  Hopefully the prednisone will calm allergies down sufficiently so that the Flonase can help keep them and check.  Switch the patient to daily Fosamax as she has a difficult time remembering to take it once a week

## 2022-06-23 ENCOUNTER — Other Ambulatory Visit: Payer: Self-pay | Admitting: Family Medicine

## 2022-07-10 ENCOUNTER — Other Ambulatory Visit: Payer: Self-pay | Admitting: Family Medicine

## 2022-07-10 DIAGNOSIS — J45901 Unspecified asthma with (acute) exacerbation: Secondary | ICD-10-CM

## 2022-07-10 DIAGNOSIS — J069 Acute upper respiratory infection, unspecified: Secondary | ICD-10-CM

## 2022-07-15 ENCOUNTER — Other Ambulatory Visit: Payer: Self-pay

## 2022-07-15 MED ORDER — CLONAZEPAM 1 MG PO TABS: 1.0000 mg | ORAL_TABLET | Freq: Every evening | ORAL | 2 refills | Status: DC

## 2022-07-15 NOTE — Telephone Encounter (Signed)
Requested medication (s) are due for refill today - no  Requested medication (s) are on the active medication list -yes  Future visit scheduled -no  Last refill: 05/11/22 #30 2RF  Notes to clinic: non delegated Rx  Requested Prescriptions  Pending Prescriptions Disp Refills   clonazePAM (KLONOPIN) 1 MG tablet 30 tablet 2    Sig: Take 1 tablet (1 mg total) by mouth every evening.     Not Delegated - Psychiatry: Anxiolytics/Hypnotics 2 Failed - 07/15/2022 10:45 AM      Failed - This refill cannot be delegated      Failed - Urine Drug Screen completed in last 360 days      Failed - Valid encounter within last 6 months    Recent Outpatient Visits           1 year ago Upper airway cough syndrome   [facility] Pickard, Priscille Heidelberg, MD   1 year ago Rhinosinusitis   [facility] Tanya Nones, Priscille Heidelberg, MD   1 year ago Viral upper respiratory tract infection   [facility] Valentino Nose, NP   1 year ago Osteopenia, unspecified location   [facility] Pickard, Priscille Heidelberg, MD   2 years ago Rhinosinusitis   [facility] Pickard, Priscille Heidelberg, MD              Passed - Patient is not pregnant         Requested Prescriptions  Pending Prescriptions Disp Refills   clonazePAM (KLONOPIN) 1 MG tablet 30 tablet 2    Sig: Take 1 tablet (1 mg total) by mouth every evening.     Not Delegated - Psychiatry: Anxiolytics/Hypnotics 2 Failed - 07/15/2022 10:45 AM      Failed - This refill cannot be delegated      Failed - Urine Drug Screen completed in last 360 days      Failed - Valid encounter within last 6 months    Recent Outpatient Visits           1 year ago Upper airway cough syndrome   [facility] Tanya Nones, Priscille Heidelberg, MD   1 year ago Rhinosinusitis   [facility] Donita Brooks, MD   1 year ago Viral upper respiratory tract infection   [facility]  Valentino Nose, NP   1 year ago Osteopenia, unspecified location   [facility] Pickard, Priscille Heidelberg, MD   2 years ago Rhinosinusitis   [facility] Pickard, Priscille Heidelberg, MD              Passed - Patient is not pregnant

## 2022-07-15 NOTE — Telephone Encounter (Signed)
Prescription Request  07/15/2022  LOV: 05/16/22  What is the name of the medication or equipment? clonazePAM (KLONOPIN) 1 MG tablet [[id number]]  Have you contacted your pharmacy to request a refill? Yes   Which pharmacy would you like this sent to?  [id number] [place], [place] - [address] [place] [address] Phone: [phone] Fax: [phone]    Patient notified that their request is being sent to the clinical staff for review and that they should receive a response within 2 business days.   Please advise at Mobile [phone] (mobile)

## 2022-07-30 ENCOUNTER — Encounter: Payer: Self-pay | Admitting: Family Medicine

## 2022-07-30 ENCOUNTER — Ambulatory Visit (INDEPENDENT_AMBULATORY_CARE_PROVIDER_SITE_OTHER): Payer: Medicare Other | Admitting: Family Medicine

## 2022-07-30 VITALS — BP 120/78 | HR 67 | Temp 98.5°F | Ht 63.0 in | Wt 145.0 lb

## 2022-07-30 DIAGNOSIS — B9689 Other specified bacterial agents as the cause of diseases classified elsewhere: Secondary | ICD-10-CM | POA: Diagnosis not present

## 2022-07-30 DIAGNOSIS — J019 Acute sinusitis, unspecified: Secondary | ICD-10-CM | POA: Diagnosis not present

## 2022-07-30 MED ORDER — HYDROCOD POLI-CHLORPHE POLI ER 10-8 MG/5ML PO SUER: 5.0000 mL | Freq: Two times a day (BID) | ORAL | 0 refills | Status: DC | PRN

## 2022-07-30 MED ORDER — AMOXICILLIN-POT CLAVULANATE 875-125 MG PO TABS: 1.0000 | ORAL_TABLET | Freq: Two times a day (BID) | ORAL | 0 refills | Status: DC

## 2022-07-30 NOTE — Progress Notes (Signed)
Subjective:    Patient ID: Chelsea Griffith, female    DOB: [birth year], 67 y.o.   MRN: [id number] I saw the patient 1 month ago.  At that time I felt she had sinusitis due to allergies and we treated the patient with prednisone in addition to allergy medication.  She states that she continues to have postnasal drip, sinus pressure.  She reports pain in both maxillary sinuses.  She reports nonstop coughing due to sinus drip and nasal drip.  Been use albuterol inhaler along with an inhaler reports her weight and the cough is not improving.  She denies any chest pain or pleurisy or hemoptysis.  She denies any purulent sputum.  She does have bloody mucus that she is able to blow from her nose.  She also reports purulent nasal drainage Past Medical History:  Diagnosis Date   Anxiety    Arthritis    fingers and thumbs   Asthma    Depression    GERD (gastroesophageal reflux disease)    History of anemia    Hyperlipidemia    Osteoporosis    right hip   Rotator cuff tear, right    Past Surgical History:  Procedure Laterality Date   ABDOMINAL HYSTERECTOMY     CESAREAN SECTION      2 times   COLONOSCOPY     DILATION AND CURETTAGE OF UTERUS     several   ELBOW SURGERY Bilateral     2times/1 time on left elbow   KNEE SURGERY     right knee   PARTIAL HYSTERECTOMY     2 times   REVERSE SHOULDER ARTHROPLASTY Right 09/06/2018   Procedure: REVERSE SHOULDER ARTHROPLASTY;  Surgeon: Bjorn Pippin, MD;  Location: [facility];  Service: Orthopedics;  Laterality: Right;   ROTATOR CUFF REPAIR     right shoulderx2    Current Outpatient Medications on File Prior to Visit  Medication Sig Dispense Refill   albuterol (PROAIR HFA) 108 (90 Base) MCG/ACT inhaler Inhale 2 puffs into the lungs every 4 (four) hours as needed for wheezing or shortness of breath. 18 g 2   alendronate (FOSAMAX) 10 MG tablet Take 1 tablet (10 mg total) by mouth daily before breakfast. Take with a full glass of water on an empty  stomach. 30 tablet 11   buPROPion (WELLBUTRIN XL) 150 MG 24 hr tablet TAKE 1 TABLET BY MOUTH EVERY DAY 90 tablet 2   cholecalciferol (VITAMIN D3) 25 MCG (1000 UNIT) tablet Take 1,000 Units by mouth daily.     clonazePAM (KLONOPIN) 1 MG tablet Take 1 tablet (1 mg total) by mouth every evening. 30 tablet 2   FLUoxetine (PROZAC) 20 MG capsule Take 1 capsule (20 mg total) by mouth daily. 90 capsule 3   fluticasone (FLONASE) 50 MCG/ACT nasal spray PLACE 2 SPRAYS INTO EACH NOSTRIL DAILY. 48 mL 2   levocetirizine (XYZAL) 5 MG tablet TAKE 1 TABLET BY MOUTH EVERY DAY IN THE EVENING 90 tablet 3   montelukast (SINGULAIR) 10 MG tablet TAKE 1 TABLET BY MOUTH EVERYDAY AT BEDTIME 90 tablet 0   Multiple Vitamin (MULTIVITAMIN WITH MINERALS) TABS tablet Take 2 tablets by mouth daily.     Multiple Vitamins-Minerals (PRESERVISION AREDS 2) CAPS Take 1 capsule by mouth 2 (two) times a day.     pravastatin (PRAVACHOL) 20 MG tablet Take 1 tablet (20 mg total) by mouth daily. 90 tablet 3   predniSONE (DELTASONE) 20 MG tablet 3 tabs poqday 1-2, 2 tabs poqday  3-4, 1 tab poqday 5-6 12 tablet 0   scopolamine (TRANSDERM SCOP, 1.5 MG,) 1 MG/3DAYS Place 1 patch (1.5 mg total) onto the skin every 3 (three) days. 2 patch 0   traZODone (DESYREL) 100 MG tablet TAKE 1 TABLET BY MOUTH AT BEDTIME AS NEEDED FOR SLEEP. 90 tablet 1   No current facility-administered medications on file prior to visit.   Allergies  Allergen Reactions   Codeine Itching   Milk-Related Compounds Nausea And Vomiting    Just milk, as an infant dropped significant amount of weight    Other     Smells may cause asthma atack   Social History   Socioeconomic History   Marital status: Married    Spouse name: Not on file   Number of children: Not on file   Years of education: Not on file   Highest education level: Bachelor's degree (e.g., BA, AB, BS)  Occupational History   Not on file  Tobacco Use   Smoking status: Never   Smokeless tobacco: Never   Vaping Use   Vaping Use: Never used  Substance and Sexual Activity   Alcohol use: Yes    Alcohol/week: 1.0 standard drink of alcohol    Types: 1 Shots of liquor per week    Comment: occasional weekly drink   Drug use: No   Sexual activity: Yes    Comment: single  Other Topics Concern   Not on file  Social History Narrative   Not on file   Social Determinants of Health   Financial Resource Strain: Low Risk  (06/22/2022)   Overall Financial Resource Strain (CARDIA)    Difficulty of Paying Living Expenses: Not hard at all  Food Insecurity: No Food Insecurity (06/22/2022)   Hunger Vital Sign    Worried About Running Out of Food in the Last Year: Never true    Ran Out of Food in the Last Year: Never true  Transportation Needs: No Transportation Needs (06/22/2022)   PRAPARE - Administrator, Civil Service (Medical): No    Lack of Transportation (Non-Medical): No  Physical Activity: Insufficiently Active (06/22/2022)   Exercise Vital Sign    Days of Exercise per Week: 1 day    Minutes of Exercise per Session: 10 min  Stress: No Stress Concern Present (06/22/2022)   [employer] of Occupational Health - Occupational Stress Questionnaire    Feeling of Stress : Not at all  Social Connections: Moderately Integrated (06/22/2022)   Social Connection and Isolation Panel [NHANES]    Frequency of Communication with Friends and Family: Three times a week    Frequency of Social Gatherings with Friends and Family: Twice a week    Attends Religious Services: More than 4 times per year    Active Member of [employer] or Organizations: No    Attends Engineer, structural: Not on file    Marital Status: Married  Catering manager Violence: Not on file      Review of Systems  All other systems reviewed and are negative.      Objective:   Physical Exam Vitals reviewed.  Constitutional:      General: She is not in acute distress.    Appearance: She is well-developed. She  is not diaphoretic.  HENT:     Right Ear: Tympanic membrane, ear canal and external ear normal.     Left Ear: Tympanic membrane, ear canal and external ear normal.     Nose: Congestion and rhinorrhea present.  Right Sinus: Maxillary sinus tenderness present. No frontal sinus tenderness.     Left Sinus: Maxillary sinus tenderness present. No frontal sinus tenderness.     Mouth/Throat:     Pharynx: No oropharyngeal exudate or posterior oropharyngeal erythema.  Eyes:     Conjunctiva/sclera: Conjunctivae normal.  Cardiovascular:     Rate and Rhythm: Normal rate and regular rhythm.     Heart sounds: Normal heart sounds. No murmur heard. Pulmonary:     Effort: Pulmonary effort is normal.     Breath sounds: No decreased breath sounds, wheezing, rhonchi or rales.  Chest:     Chest wall: No tenderness.  Musculoskeletal:     Cervical back: Neck supple.  Lymphadenopathy:     Cervical: No cervical adenopathy.           Assessment & Plan:  Acute bacterial rhinosinusitis Try the patient on Augmentin 875 mg twice daily for 10 days for sinus infection.  Continue her allergy medication.  Use Tussionex 1 teaspoon every 12 hours as needed coughing

## 2022-08-10 ENCOUNTER — Other Ambulatory Visit: Payer: Self-pay | Admitting: Family Medicine

## 2022-08-10 ENCOUNTER — Telehealth: Payer: Self-pay | Admitting: Family Medicine

## 2022-08-10 MED ORDER — HYDROCODONE BIT-HOMATROP MBR 5-1.5 MG/5ML PO SOLN: 5.0000 mL | Freq: Three times a day (TID) | ORAL | 0 refills | Status: DC | PRN

## 2022-08-10 NOTE — Telephone Encounter (Signed)
Patient came to the office to follow up on a refill sent to the pharmacy for  HYDROCODONE [facility] SUSP  Pharmacy confirmed as   [facility] [address].  As per patient, the pharmacy contacted her to inform her they're out of stock and are requesting an alternative.  Patient stated she hasn't heard anything from this office and is requesting a call with an update.  Patient also requesting call during business hours; due to screening calls, settings on her phone have calls going straight to voicemail but she will call right back.   Please advise at [phone].

## 2022-09-21 DIAGNOSIS — L821 Other seborrheic keratosis: Secondary | ICD-10-CM | POA: Diagnosis not present

## 2022-09-21 DIAGNOSIS — D485 Neoplasm of uncertain behavior of skin: Secondary | ICD-10-CM | POA: Diagnosis not present

## 2022-09-21 DIAGNOSIS — D225 Melanocytic nevi of trunk: Secondary | ICD-10-CM | POA: Diagnosis not present

## 2022-09-21 DIAGNOSIS — L814 Other melanin hyperpigmentation: Secondary | ICD-10-CM | POA: Diagnosis not present

## 2022-10-09 ENCOUNTER — Other Ambulatory Visit: Payer: Self-pay | Admitting: Family Medicine

## 2022-10-09 DIAGNOSIS — J45901 Unspecified asthma with (acute) exacerbation: Secondary | ICD-10-CM

## 2022-10-09 DIAGNOSIS — J069 Acute upper respiratory infection, unspecified: Secondary | ICD-10-CM

## 2022-10-15 ENCOUNTER — Telehealth: Payer: Self-pay | Admitting: Family Medicine

## 2022-10-15 ENCOUNTER — Other Ambulatory Visit: Payer: Self-pay | Admitting: Family Medicine

## 2022-10-15 MED ORDER — CLONAZEPAM 1 MG PO TABS: 1.0000 mg | ORAL_TABLET | Freq: Every evening | ORAL | 2 refills | Status: DC

## 2022-10-15 NOTE — Telephone Encounter (Signed)
Prescription Request  10/15/2022  LOV: 07/30/2022  What is the name of the medication or equipment?   clonazePAM (KLONOPIN) 1 MG tablet  **will run out of pills tomorrow**  Have you contacted your pharmacy to request a refill? Yes   Which pharmacy would you like this sent to?  [id number] [place], [place] - [address] [place] [address] Phone: [phone] Fax: [phone]    Patient notified that their request is being sent to the clinical staff for review and that they should receive a response within 2 business days.   Please advise patient at [phone].

## 2022-10-28 DIAGNOSIS — L648 Other androgenic alopecia: Secondary | ICD-10-CM | POA: Diagnosis not present

## 2022-10-28 DIAGNOSIS — L650 Telogen effluvium: Secondary | ICD-10-CM | POA: Diagnosis not present

## 2022-12-14 ENCOUNTER — Other Ambulatory Visit: Payer: Self-pay

## 2022-12-14 ENCOUNTER — Telehealth: Payer: Self-pay

## 2022-12-14 DIAGNOSIS — J4521 Mild intermittent asthma with (acute) exacerbation: Secondary | ICD-10-CM

## 2022-12-14 DIAGNOSIS — R058 Other specified cough: Secondary | ICD-10-CM

## 2022-12-14 DIAGNOSIS — J329 Chronic sinusitis, unspecified: Secondary | ICD-10-CM

## 2022-12-14 MED ORDER — LEVOCETIRIZINE DIHYDROCHLORIDE 5 MG PO TABS
5.0000 mg | ORAL_TABLET | Freq: Every evening | ORAL | 1 refills | Status: DC
Start: 2022-12-14 — End: 2023-06-13

## 2022-12-14 NOTE — Telephone Encounter (Signed)
Prescription Request  12/14/2022  LOV: 07/30/22  What is the name of the medication or equipment? levocetirizine (XYZAL) 5 MG tablet [[id number]]   Have you contacted your pharmacy to request a refill? Yes   Which pharmacy would you like this sent to?  [id number] [place], [place] - [address] [place] [address] Phone: [phone] Fax: [phone]    Patient notified that their request is being sent to the clinical staff for review and that they should receive a response within 2 business days.   Please advise at [facility] [phone]

## 2022-12-16 ENCOUNTER — Other Ambulatory Visit: Payer: Self-pay | Admitting: Family Medicine

## 2022-12-16 NOTE — Telephone Encounter (Signed)
Requested Prescriptions  Pending Prescriptions Disp Refills   traZODone (DESYREL) 100 MG tablet [Pharmacy Med Name: TRAZODONE 100 MG TABLET] 90 tablet 0    Sig: TAKE 1 TABLET BY MOUTH EVERY DAY AT BEDTIME AS NEEDED FOR SLEEP     Psychiatry: Antidepressants - Serotonin Modulator Failed - 12/16/2022  1:35 AM      Failed - Valid encounter within last 6 months    Recent Outpatient Visits           1 year ago Upper airway cough syndrome   [facility] Donita Brooks, MD   1 year ago Rhinosinusitis   [facility] Donita Brooks, MD   1 year ago Viral upper respiratory tract infection   [facility] Valentino Nose, NP   2 years ago Osteopenia, unspecified location   [facility] Pickard, Priscille Heidelberg, MD   2 years ago Rhinosinusitis   [facility] Pickard, Priscille Heidelberg, MD              Passed - Completed PHQ-2 or PHQ-9 in the last 360 days

## 2022-12-18 ENCOUNTER — Other Ambulatory Visit: Payer: Self-pay | Admitting: Family Medicine

## 2022-12-18 DIAGNOSIS — J069 Acute upper respiratory infection, unspecified: Secondary | ICD-10-CM

## 2022-12-18 DIAGNOSIS — J45901 Unspecified asthma with (acute) exacerbation: Secondary | ICD-10-CM

## 2022-12-20 NOTE — Telephone Encounter (Signed)
OV needed for additional refills.  Requested Prescriptions  Pending Prescriptions Disp Refills   montelukast (SINGULAIR) 10 MG tablet [Pharmacy Med Name: MONTELUKAST SOD 10 MG TABLET] 90 tablet 0    Sig: TAKE 1 TABLET BY MOUTH EVERYDAY AT BEDTIME     Pulmonology:  Leukotriene Inhibitors Failed - 12/18/2022 11:01 AM      Failed - Valid encounter within last 12 months    Recent Outpatient Visits           1 year ago Upper airway cough syndrome   [facility] Donita Brooks, MD   1 year ago Rhinosinusitis   [facility] Donita Brooks, MD   1 year ago Viral upper respiratory tract infection   [facility] Valentino Nose, NP   2 years ago Osteopenia, unspecified location   [facility] Pickard, Priscille Heidelberg, MD   2 years ago Rhinosinusitis   [facility] Pickard, Priscille Heidelberg, MD

## 2022-12-21 DIAGNOSIS — K080 Exfoliation of teeth due to systemic causes: Secondary | ICD-10-CM | POA: Diagnosis not present

## 2022-12-30 ENCOUNTER — Ambulatory Visit: Payer: Medicare Other

## 2022-12-30 VITALS — Ht 63.0 in | Wt 145.0 lb

## 2022-12-30 DIAGNOSIS — Z0000 Encounter for general adult medical examination without abnormal findings: Secondary | ICD-10-CM | POA: Diagnosis not present

## 2022-12-30 NOTE — Progress Notes (Signed)
Subjective:   Chelsea Griffith is a 67 y.o. female who presents for Medicare Annual (Subsequent) preventive examination.  Visit Complete: Virtual I connected with  Nadara Mode on 12/30/22 by a audio enabled telemedicine application and verified that I am speaking with the correct person using two identifiers.  Patient Location: Home  Provider Location: Home Office  I discussed the limitations of evaluation and management by telemedicine. The patient expressed understanding and agreed to proceed.  Vital Signs: Because this visit was a virtual/telehealth visit, some criteria may be missing or patient reported. Any vitals not documented were not able to be obtained and vitals that have been documented are patient reported.  Cardiac Risk Factors include: advanced age (>50men, >30 women);dyslipidemia     Objective:    Today's Vitals   12/30/22 1005  Weight: 145 lb (65.8 kg)  Height: 5\' 3"  (1.6 m)   Body mass index is 25.69 kg/m.     12/30/2022   10:18 AM 12/06/2021    4:00 PM 12/12/2020    9:31 AM 12/16/2018   12:15 AM 09/06/2018    3:49 PM 09/01/2018   10:21 AM 02/10/2015    4:27 AM  Advanced Directives  Does Patient Have a Medical Advance Directive? No No Yes No Yes Yes No  Type of Advance Directive     Living will;Healthcare Power of [employer] Power of [place];Living will   Does patient want to make changes to medical advance directive?   No - Patient declined  No - Patient declined    Copy of Healthcare Power of Attorney in Chart?     No - copy requested No - copy requested   Would patient like information on creating a medical advance directive? Yes (MAU/Ambulatory/Procedural Areas - Information given)   No - Guardian declined   No - patient declined information    Current Medications (verified) Outpatient Encounter Medications as of 12/30/2022  Medication Sig   albuterol (PROAIR HFA) 108 (90 Base) MCG/ACT inhaler Inhale 2 puffs into the lungs every 4  (four) hours as needed for wheezing or shortness of breath.   alendronate (FOSAMAX) 10 MG tablet Take 1 tablet (10 mg total) by mouth daily before breakfast. Take with a full glass of water on an empty stomach.   buPROPion (WELLBUTRIN XL) 150 MG 24 hr tablet TAKE 1 TABLET BY MOUTH EVERY DAY   cholecalciferol (VITAMIN D3) 25 MCG (1000 UNIT) tablet Take 1,000 Units by mouth daily.   clonazePAM (KLONOPIN) 1 MG tablet Take 1 tablet (1 mg total) by mouth every evening.   FLUoxetine (PROZAC) 20 MG capsule Take 1 capsule (20 mg total) by mouth daily.   fluticasone (FLONASE) 50 MCG/ACT nasal spray PLACE 2 SPRAYS INTO EACH NOSTRIL DAILY.   HYDROcodone bit-homatropine (HYCODAN) 5-1.5 MG/5ML syrup Take 5 mLs by mouth every 8 (eight) hours as needed for cough.   levocetirizine (XYZAL) 5 MG tablet Take 1 tablet (5 mg total) by mouth every evening.   montelukast (SINGULAIR) 10 MG tablet TAKE 1 TABLET BY MOUTH EVERYDAY AT BEDTIME   Multiple Vitamin (MULTIVITAMIN WITH MINERALS) TABS tablet Take 2 tablets by mouth daily.   Multiple Vitamins-Minerals (PRESERVISION AREDS 2) CAPS Take 1 capsule by mouth 2 (two) times a day.   pravastatin (PRAVACHOL) 20 MG tablet Take 1 tablet (20 mg total) by mouth daily.   scopolamine (TRANSDERM SCOP, 1.5 MG,) 1 MG/3DAYS Place 1 patch (1.5 mg total) onto the skin every 3 (three) days.   traZODone (DESYREL)  100 MG tablet TAKE 1 TABLET BY MOUTH EVERY DAY AT BEDTIME AS NEEDED FOR SLEEP   No facility-administered encounter medications on file as of 12/30/2022.    Allergies (verified) Codeine, Milk-related compounds, and Other   History: Past Medical History:  Diagnosis Date   Anxiety    Arthritis    fingers and thumbs   Asthma    Depression    GERD (gastroesophageal reflux disease)    History of anemia    Hyperlipidemia    Osteoporosis    right hip   Rotator cuff tear, right    Past Surgical History:  Procedure Laterality Date   ABDOMINAL HYSTERECTOMY     CESAREAN  SECTION      2 times   COLONOSCOPY     DILATION AND CURETTAGE OF UTERUS     several   ELBOW SURGERY Bilateral     2times/1 time on left elbow   KNEE SURGERY     right knee   PARTIAL HYSTERECTOMY     2 times   REVERSE SHOULDER ARTHROPLASTY Right 09/06/2018   Procedure: REVERSE SHOULDER ARTHROPLASTY;  Surgeon: Bjorn Pippin, MD;  Location: [facility];  Service: Orthopedics;  Laterality: Right;   ROTATOR CUFF REPAIR     right shoulderx2    Family History  Problem Relation Age of Onset   Hyperlipidemia Mother    ADD / ADHD Son    Heart disease Maternal Grandmother    Colon cancer Neg Hx    Esophageal cancer Neg Hx    Rectal cancer Neg Hx    Stomach cancer Neg Hx    Social History   Socioeconomic History   Marital status: Married    Spouse name: Not on file   Number of children: Not on file   Years of education: Not on file   Highest education level: Bachelor's degree (e.g., BA, AB, BS)  Occupational History   Not on file  Tobacco Use   Smoking status: Never   Smokeless tobacco: Never  Vaping Use   Vaping status: Never Used  Substance and Sexual Activity   Alcohol use: Yes    Alcohol/week: 1.0 standard drink of alcohol    Types: 1 Shots of liquor per week    Comment: occasional weekly drink   Drug use: No   Sexual activity: Yes    Comment: single  Other Topics Concern   Not on file  Social History Narrative   Not on file   Social Determinants of Health   Financial Resource Strain: Low Risk  (12/30/2022)   Overall Financial Resource Strain (CARDIA)    Difficulty of Paying Living Expenses: Not hard at all  Food Insecurity: No Food Insecurity (12/30/2022)   Hunger Vital Sign    Worried About Running Out of Food in the Last Year: Never true    Ran Out of Food in the Last Year: Never true  Transportation Needs: No Transportation Needs (12/30/2022)   PRAPARE - Administrator, Civil Service (Medical): No    Lack of Transportation (Non-Medical): No  Physical  Activity: Insufficiently Active (12/30/2022)   Exercise Vital Sign    Days of Exercise per Week: 2 days    Minutes of Exercise per Session: 30 min  Stress: No Stress Concern Present (12/30/2022)   [employer] of Occupational Health - Occupational Stress Questionnaire    Feeling of Stress : Not at all  Social Connections: Moderately Integrated (12/30/2022)   Social Connection and Isolation Panel [NHANES]  Frequency of Communication with Friends and Family: More than three times a week    Frequency of Social Gatherings with Friends and Family: Twice a week    Attends Religious Services: More than 4 times per year    Active Member of [employer] or Organizations: No    Attends Engineer, structural: Never    Marital Status: Married    Tobacco Counseling Counseling given: Not Answered   Clinical Intake:  Pre-visit preparation completed: Yes  Pain : No/denies pain     Diabetes: No  How often do you need to have someone help you when you read instructions, pamphlets, or other written materials from your doctor or pharmacy?: 1 - Never  Interpreter Needed?: No  Information entered by :: Kandis Fantasia LPN   Activities of Daily Living    12/30/2022   10:18 AM  In your present state of health, do you have any difficulty performing the following activities:  Hearing? 0  Vision? 0  Difficulty concentrating or making decisions? 0  Walking or climbing stairs? 0  Dressing or bathing? 0  Doing errands, shopping? 0  Preparing Food and eating ? N  Using the Toilet? N  In the past six months, have you accidently leaked urine? N  Do you have problems with loss of bowel control? N  Managing your Medications? N  Managing your Finances? N  Housekeeping or managing your Housekeeping? N    Patient Care Team: Donita Brooks, MD as PCP - General (Family Medicine)  Indicate any recent Medical Services you may have received from other than [facility] providers in the past year  (date may be approximate).     Assessment:   This is a routine wellness examination for Adara.  Hearing/Vision screen Hearing Screening - Comments:: Denies hearing difficulties   Vision Screening - Comments:: No vision problems; will schedule routine eye exam soon Dr. Vonna Kotyk     Goals Addressed             This Visit's Progress    Remain active and independent        Depression Screen    12/30/2022   10:19 AM 05/07/2022   11:02 AM 12/15/2021    9:38 AM 12/12/2020    9:27 AM 11/22/2019   10:31 AM 06/12/2015    9:22 AM  PHQ 2/9 Scores  PHQ - 2 Score 4 4 1  0 0 0  PHQ- 9 Score 8 10    2     Fall Risk    12/30/2022   10:18 AM 05/07/2022   11:02 AM 12/15/2021    9:37 AM 12/12/2020    9:26 AM  Fall Risk   Falls in the past year? 0 0 0 1  Number falls in past yr: 0 0 0 0  Injury with Fall? 0 0 0 0  Risk for fall due to : No Fall Risks No Fall Risks History of fall(s) No Fall Risks  Follow up Falls prevention discussed;Education provided;Falls evaluation completed Falls prevention discussed Falls prevention discussed Falls evaluation completed    MEDICARE RISK AT HOME: Medicare Risk at Home Any stairs in or around the home?: No If so, are there any without handrails?: No Home free of loose throw rugs in walkways, pet beds, electrical cords, etc?: Yes Adequate lighting in your home to reduce risk of falls?: Yes Life alert?: No Use of a cane, walker or w/c?: No Grab bars in the bathroom?: Yes Shower chair or bench in shower?: No Elevated  toilet seat or a handicapped toilet?: Yes  TIMED UP AND GO:  Was the test performed?  No    Cognitive Function:        12/30/2022   10:18 AM  6CIT Screen  What Year? 0 points  What month? 0 points  What time? 0 points  Count back from 20 0 points  Months in reverse 0 points  Repeat phrase 0 points  Total Score 0 points    Immunizations Immunization History  Administered Date(s) Administered   Fluad Quad(high Dose 65+)  11/22/2019, 12/12/2020, 12/15/2021   Influenza,inj,Quad PF,6+ Mos 04/04/2017, 04/07/2018, 11/01/2018   PFIZER(Purple Top)SARS-COV-2 Vaccination 05/07/2019, 05/28/2019, 02/06/2020   PNEUMOCOCCAL CONJUGATE-20 12/15/2021   Pfizer(Comirnaty)Fall Seasonal Vaccine 12 years and older 12/29/2021   Respiratory Syncytial Virus Vaccine,Recomb Aduvanted(Arexvy) 12/29/2021   Tdap 04/13/2013    TDAP status: Up to date  Flu Vaccine status: Due, Education has been provided regarding the importance of this vaccine. Advised may receive this vaccine at local pharmacy or Health Dept. Aware to provide a copy of the vaccination record if obtained from local pharmacy or Health Dept. Verbalized acceptance and understanding.  Pneumococcal vaccine status: Up to date  Covid-19 vaccine status: Information provided on how to obtain vaccines.   Qualifies for Shingles Vaccine? Yes   Zostavax completed No   Shingrix Completed?: No.    Education has been provided regarding the importance of this vaccine. Patient has been advised to call insurance company to determine out of pocket expense if they have not yet received this vaccine. Advised may also receive vaccine at local pharmacy or Health Dept. Verbalized acceptance and understanding.  Screening Tests Health Maintenance  Topic Date Due   Zoster Vaccines- Shingrix (1 of 2) Never done   INFLUENZA VACCINE  10/07/2022   COVID-19 Vaccine (5 - 2023-24 season) 11/07/2022   DTaP/Tdap/Td (2 - Td or Tdap) 04/14/2023   Medicare Annual Wellness (AWV)  12/30/2023   MAMMOGRAM  06/03/2024   Colonoscopy  07/28/2024   Pneumonia Vaccine 60+ Years old  Completed   DEXA SCAN  Completed   Hepatitis C Screening  Completed   HPV VACCINES  Aged Out    Health Maintenance  Health Maintenance Due  Topic Date Due   Zoster Vaccines- Shingrix (1 of 2) Never done   INFLUENZA VACCINE  10/07/2022   COVID-19 Vaccine (5 - 2023-24 season) 11/07/2022    Colorectal cancer screening: Type  of screening: Colonoscopy. Completed 07/29/14. Repeat every 10 years  Mammogram status: Completed 06/04/22. Repeat every year  Bone Density status: Completed 06/01/21. Results reflect: Bone density results: OSTEOPOROSIS. Repeat every 2 years.  Lung Cancer Screening: (Low Dose CT Chest recommended if Age 81-80 years, 20 pack-year currently smoking OR have quit w/in 15years.) does not qualify.   Lung Cancer Screening Referral: n/a  Additional Screening:  Hepatitis C Screening: does qualify; Completed 04/04/17  Vision Screening: Recommended annual ophthalmology exams for early detection of glaucoma and other disorders of the eye. Is the patient up to date with their annual eye exam?  Yes  Who is the provider or what is the name of the office in which the patient attends annual eye exams? Dr. Vonna Kotyk  If pt is not established with a provider, would they like to be referred to a provider to establish care? No .   Dental Screening: Recommended annual dental exams for proper oral hygiene  Community Resource Referral / Chronic Care Management: CRR required this visit?  No   CCM required this  visit?  No     Plan:     I have personally reviewed and noted the following in the patient's chart:   Medical and social history Use of alcohol, tobacco or illicit drugs  Current medications and supplements including opioid prescriptions. Patient is not currently taking opioid prescriptions. Functional ability and status Nutritional status Physical activity Advanced directives List of other physicians Hospitalizations, surgeries, and ER visits in previous 12 months Vitals Screenings to include cognitive, depression, and falls Referrals and appointments  In addition, I have reviewed and discussed with patient certain preventive protocols, quality metrics, and best practice recommendations. A written personalized care plan for preventive services as well as general preventive health recommendations  were provided to patient.     Kandis Fantasia [place], [place]   [id number]/[id number]   After Visit Summary: (MyChart) Due to this being a telephonic visit, the after visit summary with patients personalized plan was offered to patient via MyChart   Nurse Notes: No concerns at this time

## 2022-12-30 NOTE — Patient Instructions (Signed)
Ms. Arrambide , Thank you for taking time to come for your Medicare Wellness Visit. I appreciate your ongoing commitment to your health goals. Please review the following plan we discussed and let me know if I can assist you in the future.   Referrals/Orders/Follow-Ups/Clinician Recommendations: Aim for 30 minutes of exercise or brisk walking, 6-8 glasses of water, and 5 servings of fruits and vegetables each day.  This is a list of the screening recommended for you and due dates:  Health Maintenance  Topic Date Due   Zoster (Shingles) Vaccine (1 of 2) Never done   Flu Shot  10/07/2022   COVID-19 Vaccine (5 - 2023-24 season) 11/07/2022   DTaP/Tdap/Td vaccine (2 - Td or Tdap) 04/14/2023   Medicare Annual Wellness Visit  12/30/2023   Mammogram  06/03/2024   Colon Cancer Screening  07/28/2024   Pneumonia Vaccine  Completed   DEXA scan (bone density measurement)  Completed   Hepatitis C Screening  Completed   HPV Vaccine  Aged Out    Advanced directives: (ACP Link)Information on Advanced Care Planning can be found at [facility] [place] Advance Health Care Directives Advance Health Care Directives ([email or web address]   Next Medicare Annual Wellness Visit scheduled for next year: Yes

## 2023-01-24 ENCOUNTER — Other Ambulatory Visit: Payer: Self-pay | Admitting: Family Medicine

## 2023-01-24 NOTE — Telephone Encounter (Signed)
Copied from CRM [id number]. Topic: Clinical - Medication Refill >> Jan 24, 2023 11:57 AM Maxwell Marion wrote: Most Recent Primary Care Visit:  Provider: Anthoney Harada  Department: [facility]  Visit Type: MEDICARE AWV, SEQUENTIAL  Date: 12/30/2022  Medication: ***  Has the patient contacted their pharmacy?  (Agent: If no, request that the patient contact the pharmacy for the refill. If patient does not wish to contact the pharmacy document the reason why and proceed with request.) (Agent: If yes, when and what did the pharmacy advise?)  Is this the correct pharmacy for this prescription?  If no, delete pharmacy and type the correct one.  This is the patient's preferred pharmacy:  [id number] [place], [place] - [address] [place] [address] Phone: [phone] Fax: [phone]   Has the prescription been filled recently?   Is the patient out of the medication?   Has the patient been seen for an appointment in the last year OR does the patient have an upcoming appointment?   Can we respond through MyChart?   Agent: Please be advised that Rx refills may take up to 3 business days. We ask that you follow-up with your pharmacy.

## 2023-01-26 NOTE — Telephone Encounter (Signed)
Patient came to the office to follow up on refill requested for clonazePAM (KLONOPIN) 1 MG tablet   Last dose taken 01/20/2023.  Pharmacy confirmed as:   [id number] [place], [place] - [address] [place] [address] Phone: [phone]  Fax: [phone] DEA #: [id number]   Please advise at [phone].

## 2023-02-14 MED ORDER — CLONAZEPAM 1 MG PO TABS: 1.0000 mg | ORAL_TABLET | Freq: Every evening | ORAL | 2 refills | Status: DC

## 2023-02-24 ENCOUNTER — Other Ambulatory Visit: Payer: Self-pay | Admitting: Family Medicine

## 2023-02-24 DIAGNOSIS — E7800 Pure hypercholesterolemia, unspecified: Secondary | ICD-10-CM

## 2023-02-24 NOTE — Telephone Encounter (Signed)
Requested medications are due for refill today.  yes  Requested medications are on the active medications list.  yes  Last refill. 03/02/2022 #90 3 rf  Future visit scheduled.   yes  Notes to clinic.  Labs are expired.    Requested Prescriptions  Pending Prescriptions Disp Refills   pravastatin (PRAVACHOL) 20 MG tablet [Pharmacy Med Name: PRAVASTATIN SODIUM 20 MG TAB] 90 tablet 3    Sig: TAKE 1 TABLET BY MOUTH EVERY DAY     Cardiovascular:  Antilipid - Statins Failed - 02/24/2023  9:27 AM      Failed - Valid encounter within last 12 months    Recent Outpatient Visits           1 year ago Upper airway cough syndrome   [facility] Pickard, Priscille Heidelberg, MD   1 year ago Rhinosinusitis   [facility] Tanya Nones, Priscille Heidelberg, MD   1 year ago Viral upper respiratory tract infection   [facility] Valentino Nose, NP   2 years ago Osteopenia, unspecified location   [facility] Pickard, Priscille Heidelberg, MD   3 years ago Rhinosinusitis   [facility] Pickard, Priscille Heidelberg, MD       Future Appointments             In 1 month Pickard, Priscille Heidelberg, MD [facility], [facility]            Failed - Lipid Panel in normal range within the last 12 months    Cholesterol  Date Value Ref Range Status  12/10/2021 198 <200 mg/dL Final   LDL Cholesterol (Calc)  Date Value Ref Range Status  12/10/2021 119 (H) mg/dL (calc) Final    Comment:    Reference range: <100 . Desirable range <100 mg/dL for primary prevention;   <70 mg/dL for patients with CHD or diabetic patients  with > or = 2 CHD risk factors. Marland Kitchen LDL-C is now calculated using the Martin-Hopkins  calculation, which is a validated novel method providing  better accuracy than the Friedewald equation in the  estimation of LDL-C.  Horald Pollen et al. Lenox Ahr. 1610;960(45): 2061-2068  (http://education.[employer].com/faq/FAQ164)    HDL   Date Value Ref Range Status  12/10/2021 57 > OR = 50 mg/dL Final   Triglycerides  Date Value Ref Range Status  12/10/2021 110 <150 mg/dL Final         Passed - Patient is not pregnant

## 2023-03-03 ENCOUNTER — Telehealth: Payer: Self-pay

## 2023-03-03 ENCOUNTER — Other Ambulatory Visit: Payer: Self-pay

## 2023-03-03 DIAGNOSIS — R42 Dizziness and giddiness: Secondary | ICD-10-CM

## 2023-03-03 DIAGNOSIS — E7800 Pure hypercholesterolemia, unspecified: Secondary | ICD-10-CM

## 2023-03-03 MED ORDER — PRAVASTATIN SODIUM 20 MG PO TABS: 20.0000 mg | ORAL_TABLET | Freq: Every day | ORAL | 3 refills | Status: DC

## 2023-03-03 MED ORDER — SCOPOLAMINE 1 MG/3DAYS TD PT72: 1.0000 | MEDICATED_PATCH | TRANSDERMAL | 0 refills | Status: DC

## 2023-03-03 NOTE — Telephone Encounter (Signed)
Copied from CRM [id number]. Topic: Clinical - Prescription Issue >> Mar 03, 2023  9:21 AM Chelsea Griffith wrote: Reason for CRM: Pt states her pharmacy needs an authorization for pravastatin (PRAVACHOL) 20 MG & Scopolamine (TRANSDERM SCOP, 1.5 MG,) so that she can get them refilled

## 2023-03-10 ENCOUNTER — Telehealth: Payer: Self-pay | Admitting: Family Medicine

## 2023-03-10 DIAGNOSIS — N809 Endometriosis, unspecified: Secondary | ICD-10-CM | POA: Insufficient documentation

## 2023-03-10 DIAGNOSIS — R87619 Unspecified abnormal cytological findings in specimens from cervix uteri: Secondary | ICD-10-CM | POA: Insufficient documentation

## 2023-03-10 DIAGNOSIS — R238 Other skin changes: Secondary | ICD-10-CM | POA: Insufficient documentation

## 2023-03-10 NOTE — Telephone Encounter (Signed)
 Received fax stating a prior auth was required for scopolamine 1 mg/3 days 72 hr patches. Key [id number]

## 2023-03-10 NOTE — Telephone Encounter (Signed)
 Lawrnce Notice (KeyBETHA LOH) [place] Case ID #: [id number] Rx #: [id number]  Status Sent to Plan today Drug Scopolamine  1MG /3DAYS 72 hr patches  Form [employer] [facility] Electronic Request Form Original Claim Info [phone] MD CALL [phone] RISK MEDICATION IN THE ELDERLY;CONSIDER NON-HRM ALTERNATIVE

## 2023-03-14 ENCOUNTER — Other Ambulatory Visit: Payer: Self-pay

## 2023-03-14 ENCOUNTER — Telehealth: Payer: Self-pay | Admitting: Family Medicine

## 2023-03-14 NOTE — Telephone Encounter (Signed)
 Copied from CRM [id number]. Topic: Clinical - Prescription Issue >> Mar 11, 2023 10:56 AM Montie POUR wrote: Reason for CRM: Ricka with [facility] called about medication scopolamine  (TRANSDERM SCOP , 1.5 MG,) 1 MG/3DAYS. Insurance needs to know if benefits out way the risk for the patient? Also, has prescriber documented the side effects and discussed with patient. Ricka will fax a document with questions for doctor to sign. Insurance phone # [phone] Opt. 5; Fax # [phone]

## 2023-03-14 NOTE — Telephone Encounter (Signed)
 Prescription Request  03/14/2023  LOV: 07/30/22  What is the name of the medication or equipment? traZODone  (DESYREL ) 100 MG tablet [[id number]]   Have you contacted your pharmacy to request a refill? Yes   Which pharmacy would you like this sent to?  [id number] [place], [place] - [address] [place] [address] Phone: [phone] Fax: [phone]    Patient notified that their request is being sent to the clinical staff for review and that they should receive a response within 2 business days.   Please advise at [facility] [phone]

## 2023-03-15 ENCOUNTER — Other Ambulatory Visit: Payer: Self-pay | Admitting: Family Medicine

## 2023-03-17 MED ORDER — TRAZODONE HCL 100 MG PO TABS: 100.0000 mg | ORAL_TABLET | Freq: Every evening | ORAL | 0 refills | Status: DC | PRN

## 2023-03-17 NOTE — Telephone Encounter (Signed)
 Requested Prescriptions  Refused Prescriptions Disp Refills   traZODone  (DESYREL ) 100 MG tablet 90 tablet 0     Psychiatry: Antidepressants - Serotonin Modulator Failed - 03/17/2023  7:47 AM      Failed - Valid encounter within last 6 months    Recent Outpatient Visits           1 year ago Upper airway cough syndrome   [facility] Duanne, Butler DASEN, MD   2 years ago Rhinosinusitis   [facility] Duanne Butler DASEN, MD   2 years ago Viral upper respiratory tract infection   [facility] Chandra Harlene LABOR, NP   2 years ago Osteopenia, unspecified location   [facility] Pickard, Butler DASEN, MD   3 years ago Rhinosinusitis   [facility] Pickard, Butler DASEN, MD       Future Appointments             In 3 weeks Pickard, Butler DASEN, MD [facility]            Passed - Completed PHQ-2 or PHQ-9 in the last 360 days

## 2023-03-24 ENCOUNTER — Other Ambulatory Visit: Payer: Self-pay | Admitting: Family Medicine

## 2023-03-24 NOTE — Telephone Encounter (Signed)
Prescription Request  03/24/2023  LOV: 07/30/2022  What is the name of the medication or equipment?   buPROPion (WELLBUTRIN XL) 150 MG 24 hr tablet   montelukast (SINGULAIR) 10 MG tablet [[id number]]  **90 day scripts requested**  Have you contacted your pharmacy to request a refill? Yes   Which pharmacy would you like this sent to?  [id number] [place], [place] - [address] [place] [address] Phone: [phone] Fax: [phone]    Patient notified that their request is being sent to the clinical staff for review and that they should receive a response within 2 business days.   Please advise pharmacist.

## 2023-03-25 MED ORDER — BUPROPION HCL ER (XL) 150 MG PO TB24: 150.0000 mg | ORAL_TABLET | Freq: Every day | ORAL | 0 refills | Status: DC

## 2023-03-25 NOTE — Telephone Encounter (Signed)
Requested Prescriptions  Pending Prescriptions Disp Refills   buPROPion (WELLBUTRIN XL) 150 MG 24 hr tablet 90 tablet 0    Sig: Take 1 tablet (150 mg total) by mouth daily.     Psychiatry: Antidepressants - bupropion Failed - 03/25/2023  9:19 AM      Failed - Cr in normal range and within 360 days    Creat  Date Value Ref Range Status  12/10/2021 0.87 0.50 - 1.05 mg/dL Final         Failed - AST in normal range and within 360 days    AST  Date Value Ref Range Status  12/10/2021 13 10 - 35 U/L Final         Failed - ALT in normal range and within 360 days    ALT  Date Value Ref Range Status  12/10/2021 11 6 - 29 U/L Final         Failed - Valid encounter within last 6 months    Recent Outpatient Visits           1 year ago Upper airway cough syndrome   [facility] Donita Brooks, MD   2 years ago Rhinosinusitis   [facility] Donita Brooks, MD   2 years ago Viral upper respiratory tract infection   [facility] Valentino Nose, NP   2 years ago Osteopenia, unspecified location   [facility] Pickard, Priscille Heidelberg, MD   3 years ago Rhinosinusitis   [facility] Pickard, Priscille Heidelberg, MD       Future Appointments             In 2 weeks Tanya Nones, Priscille Heidelberg, MD [facility]            Passed - Completed PHQ-2 or PHQ-9 in the last 360 days      Passed - Last BP in normal range    BP Readings from Last 1 Encounters:  07/30/22 120/78

## 2023-03-29 ENCOUNTER — Other Ambulatory Visit: Payer: Self-pay | Admitting: Family Medicine

## 2023-03-29 ENCOUNTER — Other Ambulatory Visit: Payer: Self-pay

## 2023-03-29 ENCOUNTER — Telehealth: Payer: Self-pay

## 2023-03-29 DIAGNOSIS — J45901 Unspecified asthma with (acute) exacerbation: Secondary | ICD-10-CM

## 2023-03-29 DIAGNOSIS — J069 Acute upper respiratory infection, unspecified: Secondary | ICD-10-CM

## 2023-03-29 DIAGNOSIS — H353131 Nonexudative age-related macular degeneration, bilateral, early dry stage: Secondary | ICD-10-CM | POA: Diagnosis not present

## 2023-03-29 MED ORDER — MONTELUKAST SODIUM 10 MG PO TABS: 10.0000 mg | ORAL_TABLET | Freq: Every day | ORAL | 1 refills | Status: DC

## 2023-03-29 MED ORDER — TRAZODONE HCL 100 MG PO TABS: 100.0000 mg | ORAL_TABLET | Freq: Every evening | ORAL | 3 refills | Status: DC | PRN

## 2023-03-29 NOTE — Telephone Encounter (Signed)
Prescription Request  03/29/2023  LOV: 12/30/22 UPCOMING CPE 04/12/23  What is the name of the medication or equipment? montelukast (SINGULAIR) 10 MG tablet [[id number]]   Have you contacted your pharmacy to request a refill? Yes   Which pharmacy would you like this sent to?  [id number] [place], [place] - [address] [facility] [address] [place] [address] Phone: [phone] Fax: [phone]    Patient notified that their request is being sent to the clinical staff for review and that they should receive a response within 2 business days.   Please advise at [facility] [phone]

## 2023-04-07 DIAGNOSIS — K080 Exfoliation of teeth due to systemic causes: Secondary | ICD-10-CM | POA: Diagnosis not present

## 2023-04-08 ENCOUNTER — Other Ambulatory Visit: Payer: Medicare Other

## 2023-04-08 DIAGNOSIS — E7800 Pure hypercholesterolemia, unspecified: Secondary | ICD-10-CM | POA: Diagnosis not present

## 2023-04-08 DIAGNOSIS — R42 Dizziness and giddiness: Secondary | ICD-10-CM | POA: Diagnosis not present

## 2023-04-08 DIAGNOSIS — R531 Weakness: Secondary | ICD-10-CM | POA: Diagnosis not present

## 2023-04-08 DIAGNOSIS — M810 Age-related osteoporosis without current pathological fracture: Secondary | ICD-10-CM | POA: Diagnosis not present

## 2023-04-09 LAB — LIPID PANEL
Cholesterol: 196 mg/dL (ref <200)
HDL: 54 mg/dL (ref >=50)
LDL Cholesterol (Calc): 120 mg/dL — ABNORMAL HIGH
Non-HDL Cholesterol (Calc): 142 mg/dL — ABNORMAL HIGH (ref <130)
Total CHOL/HDL Ratio: 3.6 (calc) (ref <5.0)
Triglycerides: 111 mg/dL (ref <150)

## 2023-04-09 LAB — CBC WITH DIFFERENTIAL/PLATELET
Absolute Lymphocytes: 1507 {cells}/uL (ref 850–3900)
Absolute Monocytes: 352 {cells}/uL (ref 200–950)
Basophils Absolute: 22 {cells}/uL (ref 0–200)
Basophils Relative: 0.4 %
Eosinophils Absolute: 83 {cells}/uL (ref 15–500)
Eosinophils Relative: 1.5 %
HCT: 42.4 % (ref 35.0–45.0)
Hemoglobin: 14.3 g/dL (ref 11.7–15.5)
MCH: 29.8 pg (ref 27.0–33.0)
MCHC: 33.7 g/dL (ref 32.0–36.0)
MCV: 88.3 fL (ref 80.0–100.0)
MPV: 10 fL (ref 7.5–12.5)
Monocytes Relative: 6.4 %
Neutro Abs: 3537 {cells}/uL (ref 1500–7800)
Neutrophils Relative %: 64.3 %
Platelets: 172 10*3/uL (ref 140–400)
RBC: 4.8 10*6/uL (ref 3.80–5.10)
RDW: 12.6 % (ref 11.0–15.0)
Total Lymphocyte: 27.4 %
WBC: 5.5 10*3/uL (ref 3.8–10.8)

## 2023-04-09 LAB — VITAMIN D 25 HYDROXY (VIT D DEFICIENCY, FRACTURES): Vit D, 25-Hydroxy: 40 ng/mL (ref 30–100)

## 2023-04-09 LAB — COMPLETE METABOLIC PANEL WITH GFR
AG Ratio: 2.1 (calc) (ref 1.0–2.5)
ALT: 13 U/L (ref 6–29)
AST: 13 U/L (ref 10–35)
Albumin: 4.6 g/dL (ref 3.6–5.1)
Alkaline phosphatase (APISO): 65 U/L (ref 37–153)
BUN: 17 mg/dL (ref 7–25)
CO2: 30 mmol/L (ref 20–32)
Calcium: 9.8 mg/dL (ref 8.6–10.4)
Chloride: 103 mmol/L (ref 98–110)
Creat: 0.79 mg/dL (ref 0.50–1.05)
Globulin: 2.2 g/dL (ref 1.9–3.7)
Glucose, Bld: 88 mg/dL (ref 65–99)
Potassium: 4.1 mmol/L (ref 3.5–5.3)
Sodium: 142 mmol/L (ref 135–146)
Total Bilirubin: 0.5 mg/dL (ref 0.2–1.2)
Total Protein: 6.8 g/dL (ref 6.1–8.1)
eGFR: 81 mL/min/{1.73_m2} (ref >=60)

## 2023-04-12 ENCOUNTER — Encounter: Payer: Self-pay | Admitting: Family Medicine

## 2023-04-12 ENCOUNTER — Ambulatory Visit (INDEPENDENT_AMBULATORY_CARE_PROVIDER_SITE_OTHER): Payer: Medicare Other | Admitting: Family Medicine

## 2023-04-12 VITALS — BP 118/72 | HR 70 | Temp 98.1°F | Ht 63.0 in | Wt 141.4 lb

## 2023-04-12 DIAGNOSIS — Z23 Encounter for immunization: Secondary | ICD-10-CM | POA: Diagnosis not present

## 2023-04-12 DIAGNOSIS — M810 Age-related osteoporosis without current pathological fracture: Secondary | ICD-10-CM

## 2023-04-12 DIAGNOSIS — E7800 Pure hypercholesterolemia, unspecified: Secondary | ICD-10-CM

## 2023-04-12 DIAGNOSIS — Z0000 Encounter for general adult medical examination without abnormal findings: Secondary | ICD-10-CM

## 2023-04-12 DIAGNOSIS — Z0001 Encounter for general adult medical examination with abnormal findings: Secondary | ICD-10-CM

## 2023-04-12 MED ORDER — ALENDRONATE SODIUM 10 MG PO TABS: 10.0000 mg | ORAL_TABLET | Freq: Every day | ORAL | 3 refills | Status: DC

## 2023-04-12 NOTE — Addendum Note (Signed)
Addended by: Venia Carbon K on: 04/12/2023 10:24 AM   Modules accepted: Orders

## 2023-04-12 NOTE — Progress Notes (Signed)
 Subjective:    Patient ID: Chelsea Griffith, female    DOB: [birth year], 68 y.o.   MRN: [id number]  HPI Patient is a very pleasant 68 year old Caucasian female here today for complete physical exam.  Her mammogram is due in March.  Her last colonoscopy was in 2016 and is due again in 2026.  She has a history of a hysterectomy and therefore does not require Pap smear.  Her bone density test was performed in 2023.  She has not been on any treatment as she was unable to remember to take the Fosamax  once a week.  She would like to switch to daily Fosamax .  She is due for a bone density test next year.  She is taking calcium and vitamin D .  She is due for the shingles vaccine, flu shot, as well as the new pneumonia vaccine. Lab on 04/08/2023  Component Date Value Ref Range Status   WBC 04/08/2023 5.5  3.8 - 10.8 Thousand/uL Final   RBC 04/08/2023 4.80  3.80 - 5.10 Million/uL Final   Hemoglobin 04/08/2023 14.3  11.7 - 15.5 g/dL Final   HCT 98/68/7974 42.4  35.0 - 45.0 % Final   MCV 04/08/2023 88.3  80.0 - 100.0 fL Final   MCH 04/08/2023 29.8  27.0 - 33.0 pg Final   MCHC 04/08/2023 33.7  32.0 - 36.0 g/dL Final   Comment: For adults, a slight decrease in the calculated MCHC value (in the range of 30 to 32 g/dL) is most likely not clinically significant; however, it should be interpreted with caution in correlation with other red cell parameters and the patient's clinical condition.    RDW 04/08/2023 12.6  11.0 - 15.0 % Final   Platelets 04/08/2023 172  140 - 400 Thousand/uL Final   MPV 04/08/2023 10.0  7.5 - 12.5 fL Final   Neutro Abs 04/08/2023 3,537  1,500 - 7,800 cells/uL Final   Absolute Lymphocytes 04/08/2023 1,507  850 - 3,900 cells/uL Final   Absolute Monocytes 04/08/2023 352  200 - 950 cells/uL Final   Eosinophils Absolute 04/08/2023 83  15 - 500 cells/uL Final   Basophils Absolute 04/08/2023 22  0 - 200 cells/uL Final   Neutrophils Relative % 04/08/2023 64.3  % Final   Total  Lymphocyte 04/08/2023 27.4  % Final   Monocytes Relative 04/08/2023 6.4  % Final   Eosinophils Relative 04/08/2023 1.5  % Final   Basophils Relative 04/08/2023 0.4  % Final   Glucose, Bld 04/08/2023 88  65 - 99 mg/dL Final   Comment: .            Fasting reference interval .    BUN 04/08/2023 17  7 - 25 mg/dL Final   Creat 98/68/7974 0.79  0.50 - 1.05 mg/dL Final   eGFR 98/68/7974 81  > OR = 60 mL/min/1.33m2 Final   BUN/Creatinine Ratio 04/08/2023 SEE NOTE:  6 - 22 (calc) Final   Comment:    Not Reported: BUN and Creatinine are within    reference range. .    Sodium 04/08/2023 142  135 - 146 mmol/L Final   Potassium 04/08/2023 4.1  3.5 - 5.3 mmol/L Final   Chloride 04/08/2023 103  98 - 110 mmol/L Final   CO2 04/08/2023 30  20 - 32 mmol/L Final   Calcium 04/08/2023 9.8  8.6 - 10.4 mg/dL Final   Total Protein 98/68/7974 6.8  6.1 - 8.1 g/dL Final   Albumin 98/68/7974 4.6  3.6 - 5.1 g/dL Final  Globulin 04/08/2023 2.2  1.9 - 3.7 g/dL (calc) Final   AG Ratio 04/08/2023 2.1  1.0 - 2.5 (calc) Final   Total Bilirubin 04/08/2023 0.5  0.2 - 1.2 mg/dL Final   Alkaline phosphatase (APISO) 04/08/2023 65  37 - 153 U/L Final   AST 04/08/2023 13  10 - 35 U/L Final   ALT 04/08/2023 13  6 - 29 U/L Final   Cholesterol 04/08/2023 196  <200 mg/dL Final   HDL 98/68/7974 54  > OR = 50 mg/dL Final   Triglycerides 98/68/7974 111  <150 mg/dL Final   LDL Cholesterol (Calc) 04/08/2023 120 (H)  mg/dL (calc) Final   Comment: Reference range: <100 . Desirable range <100 mg/dL for primary prevention;   <70 mg/dL for patients with CHD or diabetic patients  with > or = 2 CHD risk factors. SABRA LDL-C is now calculated using the Martin-Hopkins  calculation, which is a validated novel method providing  better accuracy than the Friedewald equation in the  estimation of LDL-C.  Gladis APPLETHWAITE et al. SANDREA. 7986;689(80): 2061-2068  (http://education.[facility].com/faq/FAQ164)    Total CHOL/HDL Ratio 04/08/2023  3.6  <5.0 (calc) Final   Non-HDL Cholesterol (Calc) 04/08/2023 142 (H)  <130 mg/dL (calc) Final   Comment: For patients with diabetes plus 1 major ASCVD risk  factor, treating to a non-HDL-C goal of <100 mg/dL  (LDL-C of <29 mg/dL) is considered a therapeutic  option.    Vit D, 25-Hydroxy 04/08/2023 40  30 - 100 ng/mL Final   Comment: Vitamin D  Status         25-OH Vitamin D : . Deficiency:                    <20 ng/mL Insufficiency:             20 - 29 ng/mL Optimal:                 > or = 30 ng/mL . For 25-OH Vitamin D  testing on patients on  D2-supplementation and patients for whom quantitation  of D2 and D3 fractions is required, the QuestAssureD(TM) 25-OH VIT D, (D2,D3), LC/MS/MS is recommended: order  code 07111 (patients >65yrs). . See Note 1 . Note 1 . For additional information, please refer to  http://education.[facility].com/faq/FAQ199  (This link is being provided for informational/ educational purposes only.)      Immunization History  Administered Date(s) Administered   Fluad Quad(high Dose 65+) 11/22/2019, 12/12/2020, 12/15/2021   Influenza,inj,Quad PF,6+ Mos 04/04/2017, 04/07/2018, 11/01/2018   PFIZER(Purple Top)SARS-COV-2 Vaccination 05/07/2019, 05/28/2019, 02/06/2020   PNEUMOCOCCAL CONJUGATE-20 12/15/2021   Pfizer(Comirnaty)Fall Seasonal Vaccine 12 years and older 12/29/2021   Respiratory Syncytial Virus Vaccine,Recomb Aduvanted(Arexvy) 12/29/2021   Tdap 04/13/2013   Past Medical History:  Diagnosis Date   Anxiety    Arthritis    fingers and thumbs   Asthma    Depression    GERD (gastroesophageal reflux disease)    History of anemia    Hyperlipidemia    Osteoporosis    right hip   Rotator cuff tear, right    Past Surgical History:  Procedure Laterality Date   ABDOMINAL HYSTERECTOMY     CESAREAN SECTION      2 times   COLONOSCOPY     DILATION AND CURETTAGE OF UTERUS     several   ELBOW SURGERY Bilateral     2times/1 time on left  elbow   KNEE SURGERY     right knee   PARTIAL HYSTERECTOMY  2 times   REVERSE SHOULDER ARTHROPLASTY Right 09/06/2018   Procedure: REVERSE SHOULDER ARTHROPLASTY;  Surgeon: Cristy Bonner DASEN, MD;  Location: [facility];  Service: Orthopedics;  Laterality: Right;   ROTATOR CUFF REPAIR     right shoulderx2    Current Outpatient Medications on File Prior to Visit  Medication Sig Dispense Refill   albuterol  (PROAIR  HFA) 108 (90 Base) MCG/ACT inhaler Inhale 2 puffs into the lungs every 4 (four) hours as needed for wheezing or shortness of breath. 18 g 2   buPROPion  (WELLBUTRIN  XL) 150 MG 24 hr tablet Take 1 tablet (150 mg total) by mouth daily. 90 tablet 0   cholecalciferol (VITAMIN D3) 25 MCG (1000 UNIT) tablet Take 1,000 Units by mouth daily.     FLUoxetine  (PROZAC ) 20 MG capsule Take 1 capsule (20 mg total) by mouth daily. 90 capsule 3   fluticasone  (FLONASE ) 50 MCG/ACT nasal spray PLACE 2 SPRAYS INTO EACH NOSTRIL DAILY. 48 mL 2   levocetirizine (XYZAL ) 5 MG tablet Take 1 tablet (5 mg total) by mouth every evening. 90 tablet 1   montelukast  (SINGULAIR ) 10 MG tablet Take 1 tablet (10 mg total) by mouth at bedtime. 90 tablet 1   Multiple Vitamin (MULTIVITAMIN WITH MINERALS) TABS tablet Take 2 tablets by mouth daily.     Multiple Vitamins-Minerals (PRESERVISION AREDS 2) CAPS Take 1 capsule by mouth 2 (two) times a day.     pravastatin  (PRAVACHOL ) 20 MG tablet Take 1 tablet (20 mg total) by mouth daily. 90 tablet 3   traZODone  (DESYREL ) 100 MG tablet Take 1 tablet (100 mg total) by mouth at bedtime as needed for sleep. 90 tablet 3   No current facility-administered medications on file prior to visit.   Allergies  Allergen Reactions   Codeine Itching   Milk-Related Compounds Nausea And Vomiting    Just milk, as an infant dropped significant amount of weight    Other     Smells may cause asthma atack   Social History   Socioeconomic History   Marital status: Married    Spouse name: Not on file    Number of children: Not on file   Years of education: Not on file   Highest education level: Bachelor's degree (e.g., BA, AB, BS)  Occupational History   Not on file  Tobacco Use   Smoking status: Never   Smokeless tobacco: Never  Vaping Use   Vaping status: Never Used  Substance and Sexual Activity   Alcohol use: Yes    Alcohol/week: 1.0 standard drink of alcohol    Types: 1 Shots of liquor per week    Comment: occasional weekly drink   Drug use: No   Sexual activity: Yes    Comment: single  Other Topics Concern   Not on file  Social History Narrative   Not on file   Social Drivers of Health   Financial Resource Strain: Low Risk  (04/08/2023)   Overall Financial Resource Strain (CARDIA)    Difficulty of Paying Living Expenses: Not hard at all  Food Insecurity: No Food Insecurity (04/08/2023)   Hunger Vital Sign    Worried About Running Out of Food in the Last Year: Never true    Ran Out of Food in the Last Year: Never true  Transportation Needs: No Transportation Needs (04/08/2023)   PRAPARE - Administrator, Civil Service (Medical): No    Lack of Transportation (Non-Medical): No  Physical Activity: Inactive (04/08/2023)   Exercise Vital Sign  Days of Exercise per Week: 0 days    Minutes of Exercise per Session: 30 min  Stress: No Stress Concern Present (12/30/2022)   [employer] of Occupational Health - Occupational Stress Questionnaire    Feeling of Stress : Not at all  Social Connections: Moderately Isolated (04/08/2023)   Social Connection and Isolation Panel [NHANES]    Frequency of Communication with Friends and Family: Once a week    Frequency of Social Gatherings with Friends and Family: Once a week    Attends Religious Services: More than 4 times per year    Active Member of [employer] or Organizations: No    Attends Banker Meetings: Never    Marital Status: Married  Catering Manager Violence: Not At Risk (12/30/2022)   Humiliation,  Afraid, Rape, and Kick questionnaire    Fear of Current or Ex-Partner: No    Emotionally Abused: No    Physically Abused: No    Sexually Abused: No   Family History  Problem Relation Age of Onset   Hyperlipidemia Mother    ADD / ADHD Son    Heart disease Maternal Grandmother    Colon cancer Neg Hx    Esophageal cancer Neg Hx    Rectal cancer Neg Hx    Stomach cancer Neg Hx       Review of Systems  All other systems reviewed and are negative.      Objective:   Physical Exam Vitals reviewed.  Constitutional:      General: She is not in acute distress.    Appearance: She is well-developed. She is not diaphoretic.  HENT:     Head: Normocephalic and atraumatic.     Right Ear: External ear normal.     Left Ear: External ear normal.     Nose: Nose normal.     Mouth/Throat:     Pharynx: No oropharyngeal exudate.  Eyes:     General: No scleral icterus.       Right eye: No discharge.        Left eye: No discharge.     Conjunctiva/sclera: Conjunctivae normal.     Pupils: Pupils are equal, round, and reactive to light.  Neck:     Thyroid : No thyromegaly.     Vascular: No JVD.     Trachea: No tracheal deviation.  Cardiovascular:     Rate and Rhythm: Normal rate and regular rhythm.     Heart sounds: Normal heart sounds. No murmur heard.    No friction rub. No gallop.  Pulmonary:     Effort: Pulmonary effort is normal. No respiratory distress.     Breath sounds: Normal breath sounds. No stridor. No wheezing or rales.  Chest:     Chest wall: No tenderness.  Abdominal:     General: Bowel sounds are normal. There is no distension.     Palpations: Abdomen is soft. There is no mass.     Tenderness: There is no abdominal tenderness. There is no guarding or rebound.  Musculoskeletal:        General: No tenderness or deformity. Normal range of motion.     Cervical back: Normal range of motion and neck supple.  Lymphadenopathy:     Cervical: No cervical adenopathy.  Skin:     General: Skin is warm.     Coloration: Skin is not pale.     Findings: No erythema or rash.  Neurological:     Mental Status: She is alert and oriented to person,  place, and time.     Cranial Nerves: No cranial nerve deficit.     Motor: No abnormal muscle tone.     Coordination: Coordination normal.     Deep Tendon Reflexes: Reflexes are normal and symmetric.  Psychiatric:        Behavior: Behavior normal.        Thought Content: Thought content normal.        Judgment: Judgment normal.           Assessment & Plan:  Pure hypercholesterolemia - Plan: CT CARDIAC SCORING (SELF PAY ONLY)  Routine general medical examination at a health care facility  Osteoporosis without current pathological fracture, unspecified osteoporosis type Patient's cholesterol is elevated but she is hesitant to take additional medication.  She would like to proceed with a coronary calcium score to determine her risk of cardiovascular disease.  If elevated she would then take additional medication to lower cholesterol.  She received her shingles vaccine today along with her flu shot.  Colonoscopy is due next year.  Begin Fosamax  10 mg daily.  Repeat bone density DEXA next year.  Continue calcium and vitamin D .  She defers the pneumonia vaccine at the present time.  She will schedule her mammogram in March.

## 2023-04-26 ENCOUNTER — Encounter (HOSPITAL_COMMUNITY): Payer: Self-pay

## 2023-04-26 ENCOUNTER — Ambulatory Visit (HOSPITAL_COMMUNITY): Payer: Medicare Other | Attending: Family Medicine

## 2023-05-03 DIAGNOSIS — L648 Other androgenic alopecia: Secondary | ICD-10-CM | POA: Diagnosis not present

## 2023-05-03 DIAGNOSIS — H353131 Nonexudative age-related macular degeneration, bilateral, early dry stage: Secondary | ICD-10-CM | POA: Diagnosis not present

## 2023-05-10 ENCOUNTER — Other Ambulatory Visit: Payer: Self-pay | Admitting: Family Medicine

## 2023-05-12 ENCOUNTER — Encounter (HOSPITAL_COMMUNITY): Payer: Self-pay

## 2023-05-12 ENCOUNTER — Other Ambulatory Visit: Payer: Self-pay

## 2023-05-12 ENCOUNTER — Emergency Department (HOSPITAL_COMMUNITY)

## 2023-05-12 ENCOUNTER — Emergency Department (HOSPITAL_COMMUNITY)
Admission: EM | Admit: 2023-05-12 | Discharge: 2023-05-13 | Disposition: A | Discharge status: Home / Self Care | Attending: Emergency Medicine | Admitting: Emergency Medicine

## 2023-05-12 DIAGNOSIS — Y9301 Activity, walking, marching and hiking: Secondary | ICD-10-CM | POA: Diagnosis not present

## 2023-05-12 DIAGNOSIS — Z7951 Long term (current) use of inhaled steroids: Secondary | ICD-10-CM | POA: Insufficient documentation

## 2023-05-12 DIAGNOSIS — E870 Hyperosmolality and hypernatremia: Secondary | ICD-10-CM | POA: Insufficient documentation

## 2023-05-12 DIAGNOSIS — R42 Dizziness and giddiness: Secondary | ICD-10-CM | POA: Diagnosis not present

## 2023-05-12 DIAGNOSIS — M25561 Pain in right knee: Secondary | ICD-10-CM | POA: Diagnosis not present

## 2023-05-12 DIAGNOSIS — S82001A Unspecified fracture of right patella, initial encounter for closed fracture: Secondary | ICD-10-CM | POA: Diagnosis not present

## 2023-05-12 DIAGNOSIS — R609 Edema, unspecified: Secondary | ICD-10-CM | POA: Diagnosis not present

## 2023-05-12 DIAGNOSIS — J45909 Unspecified asthma, uncomplicated: Secondary | ICD-10-CM | POA: Diagnosis not present

## 2023-05-12 DIAGNOSIS — W1839XA Other fall on same level, initial encounter: Secondary | ICD-10-CM | POA: Insufficient documentation

## 2023-05-12 DIAGNOSIS — W19XXXA Unspecified fall, initial encounter: Secondary | ICD-10-CM | POA: Diagnosis not present

## 2023-05-12 DIAGNOSIS — S82031A Displaced transverse fracture of right patella, initial encounter for closed fracture: Secondary | ICD-10-CM | POA: Insufficient documentation

## 2023-05-12 DIAGNOSIS — R9431 Abnormal electrocardiogram [ECG] [EKG]: Secondary | ICD-10-CM | POA: Diagnosis not present

## 2023-05-12 MED ORDER — FENTANYL CITRATE PF 50 MCG/ML IJ SOSY
50.0000 ug | PREFILLED_SYRINGE | Freq: Once | INTRAMUSCULAR | Status: AC
  Administered 2023-05-12: 50 ug via INTRAVENOUS
  Filled 2023-05-12: qty 1

## 2023-05-12 NOTE — ED Triage Notes (Signed)
 Pt arrives EMS with reports of getting dizzy and falling straight down onto right knee. Pt reports she has just gotten up to go to kitchen when she got dizzy. Pt has swelling and deformity to right knee. Pt denies hitting head or other injury. No blood thinners.

## 2023-05-12 NOTE — ED Provider Notes (Signed)
 [place] EMERGENCY DEPARTMENT AT [facility] Provider Note   CSN: [id number] Arrival date & time: 05/12/23  2309     History  Chief Complaint  Patient presents with   Chelsea Griffith is a 68 y.o. female.  The history is provided by the patient.  Patient w/hyperlipidemia, asthma presents with right knee injury. Patient reports she was walking when she felt lightheaded and lost her balance & she fell down her right knee and had immediate pain and swelling in the knee.  Denies any head injury or LOC.  No chest pain or shortness of breath.  She has otherwise been feeling well recently except for mild cough which she attributes to her asthma. She now reports significant pain in her right knee   Denies any recent vomiting or diarrhea She reports she just recently started Fosamax Past Medical History:  Diagnosis Date   Anxiety    Arthritis    fingers and thumbs   Asthma    Depression    GERD (gastroesophageal reflux disease)    History of anemia    Hyperlipidemia    Osteoporosis    right hip   Rotator cuff tear, right     Home Medications Prior to Admission medications   Medication Sig Start Date End Date Taking? Authorizing Provider  HYDROcodone-acetaminophen (NORCO/VICODIN) 5-325 MG tablet Take 1 tablet by mouth every 6 (six) hours as needed. 05/13/23  Yes Zadie Rhine, MD  ondansetron (ZOFRAN-ODT) 4 MG disintegrating tablet Take 1 tablet (4 mg total) by mouth every 8 (eight) hours as needed. 05/13/23  Yes Zadie Rhine, MD  albuterol (PROAIR HFA) 108 (90 Base) MCG/ACT inhaler Inhale 2 puffs into the lungs every 4 (four) hours as needed for wheezing or shortness of breath. 04/24/19   Donita Brooks, MD  alendronate (FOSAMAX) 10 MG tablet Take 1 tablet (10 mg total) by mouth daily before breakfast. Take with a full glass of water on an empty stomach. 04/12/23   Donita Brooks, MD  buPROPion (WELLBUTRIN XL) 150 MG 24 hr tablet Take 1 tablet (150 mg  total) by mouth daily. 03/25/23   Donita Brooks, MD  cholecalciferol (VITAMIN D3) 25 MCG (1000 UNIT) tablet Take 1,000 Units by mouth daily.    [provider]  FLUoxetine (PROZAC) 20 MG capsule TAKE 1 CAPSULE BY MOUTH EVERY DAY 05/10/23   Donita Brooks, MD  fluticasone (FLONASE) 50 MCG/ACT nasal spray PLACE 2 SPRAYS INTO EACH NOSTRIL DAILY. 05/31/22   Donita Brooks, MD  levocetirizine (XYZAL) 5 MG tablet Take 1 tablet (5 mg total) by mouth every evening. 12/14/22   Donita Brooks, MD  montelukast (SINGULAIR) 10 MG tablet Take 1 tablet (10 mg total) by mouth at bedtime. 03/29/23   Donita Brooks, MD  Multiple Vitamin (MULTIVITAMIN WITH MINERALS) TABS tablet Take 2 tablets by mouth daily.    [provider]  Multiple Vitamins-Minerals (PRESERVISION AREDS 2) CAPS Take 1 capsule by mouth 2 (two) times a day.    [provider]  pravastatin (PRAVACHOL) 20 MG tablet Take 1 tablet (20 mg total) by mouth daily. 03/03/23   Donita Brooks, MD  traZODone (DESYREL) 100 MG tablet Take 1 tablet (100 mg total) by mouth at bedtime as needed for sleep. 03/29/23   Donita Brooks, MD      Allergies    Milk-related compounds and Other    Review of Systems   Review of Systems  Constitutional:  Negative for fever.  Respiratory:  Negative for shortness of breath.   Cardiovascular:  Negative for chest pain.  Gastrointestinal:  Negative for anal bleeding, blood in stool, diarrhea and vomiting.  Musculoskeletal:  Negative for neck pain.  Neurological:  Positive for light-headedness. Negative for headaches.    Physical Exam Updated Vital Signs BP 118/71   Pulse 65   Temp 97.8 F (36.6 C)   Resp 17   Ht 1.6 m (5\' 3" )   Wt 63.5 kg   SpO2 99%   BMI 24.80 kg/m  Physical Exam CONSTITUTIONAL: Well developed/well nourished HEAD: Normocephalic/atraumatic EYES: EOMI/PERRL, no nystagmus, no ptosis ENMT: Mucous membranes moist NECK: supple no meningeal signs CV:  S1/S2 noted, no murmurs/rubs/gallops noted LUNGS: Lungs are clear to auscultation bilaterally, no apparent distress ABDOMEN: soft, nontender NEURO:Awake/alert, face symmetric, no arm drift is noted Equal 5/5 strength with shoulder abduction, elbow flex/extension, wrist flex/extension in upper extremities and equal hand grips bilaterally Sensation to light touch intact in all extremities Full range of motion of left lower extremity Range of motion of right lower extremity limited due to knee injury EXTREMITIES: Distal pulses equal intact in both lower extremities.  The feet are warm to touch.  She is able to move her toes and dorsiflex and plantarflex the right foot without difficulty.  There is no right ankle tenderness or deformity. Right Achilles intact. Significant soft tissue swelling and tenderness to the right knee There is no tenderness to the right hip SKIN: warm, color normal PSYCH: no abnormalities of mood noted   ED Results / Procedures / Treatments   Labs (all labs ordered are listed, but only abnormal results are displayed) Labs Reviewed  CBC WITH DIFFERENTIAL/PLATELET - Abnormal; Notable for the following components:      Result Value   Hemoglobin 11.9 (*)    HCT 34.9 (*)    All other components within normal limits  BASIC METABOLIC PANEL - Abnormal; Notable for the following components:   Sodium 147 (*)    Potassium 3.2 (*)    Chloride 112 (*)    CO2 21 (*)    Calcium 7.6 (*)    All other components within normal limits    EKG EKG Interpretation Date/Time:  Friday May 13 2023 00:10:42 EST Ventricular Rate:  70 PR Interval:  169 QRS Duration:  86 QT Interval:  413 QTC Calculation: 446 R Axis:   -62  Text Interpretation: Sinus rhythm Left anterior fascicular block Borderline T abnormalities, anterior leads No significant change since last tracing Confirmed by Zadie Rhine ([id number]) on 05/13/2023 12:16:24 AM  Radiology DG Knee Right Port Result Date:  05/13/2023 CLINICAL DATA:  Fall, right knee pain EXAM: PORTABLE RIGHT KNEE - 1-2 VIEW COMPARISON:  None Available. FINDINGS: There is acute, transverse fracture through the midbody of the patella with distraction of the fracture fragments by up to 18 mm. Extensive surrounding soft tissue swelling. Moderate lipohemarthrosis noted. Distal femur and proximal tibia and fibula appear intact. Mild view compartment joint space narrowing in keeping with mild degenerative arthritis in this location. IMPRESSION: 1. Acute, distracted patellar fracture. Electronically Signed   By: Helyn Numbers M.D.   On: 05/13/2023 02:02    Procedures Procedures    Medications Ordered in ED Medications  fentaNYL (SUBLIMAZE) injection 50 mcg (50 mcg Intravenous Given 05/12/23 2335)  ondansetron (ZOFRAN) injection 4 mg (4 mg Intravenous Given 05/13/23 0218)  ketorolac (TORADOL) 15 MG/ML injection 15 mg (15 mg Intravenous Given 05/13/23 0218)  ED Course/ Medical Decision Making/ A&P Clinical Course as of 05/13/23 0254  Fri May 13, 2023  0253 Patient presents after having a brief dizzy episode and fell down injuring her right knee Patient had obvious injury to her right patella, with patella fracture.  No other signs of acute traumatic injury.  Distal pulses are intact.  Patient fell from standing onto her knee, less likely dislocation. She continues to have distal pulses denies any numbness or weakness in her right foot.  She has full range of motion of her right foot Patient is able to tolerate knee immobilizer and crutches and ambulate.  She would like to be discharged home [DW]  0254 Unclear cause of her dizziness, but this appears to have resolved.  She had no chest pain or shortness of breath, no weakness or numbness. EKG is overall unremarkable.  Minor abnormalities in her labs, but can be followed up as an outpatient [DW]  0254 She will call orthopedics later today.  No signs of any acute cardiopulmonary or neurologic  emergency she is safe for discharge [DW]    Clinical Course User Index [DW] Zadie Rhine, MD                                 Medical Decision Making Amount and/or Complexity of Data Reviewed Labs: ordered. Radiology: ordered. ECG/medicine tests: ordered.  Risk Prescription drug management.   This patient presents to the ED for concern of dizziness, this involves an extensive number of treatment options, and is a complaint that carries with it a high risk of complications and morbidity.  The differential diagnosis includes but is not limited to CVA, intracranial hemorrhage, acute coronary syndrome, renal failure, urinary tract infection, electrolyte disturbance, pneumonia    Comorbidities that complicate the patient evaluation: Patient's presentation is complicated by their history of asthma  Social Determinants of Health: Patient's  physical inactivity   increases the complexity of managing their presentation  Additional history obtained: Records reviewed Primary Care Documents  Lab Tests: I Ordered, and personally interpreted labs.  The pertinent results include: Hypernatremia  Imaging Studies ordered: I ordered imaging studies including X-ray right knee   I independently visualized and interpreted imaging which showed patella fracture I agree with the radiologist interpretation   Medicines ordered and prescription drug management: I ordered medication including Toradol for pain Reevaluation of the patient after these medicines showed that the patient    improved   Reevaluation: After the interventions noted above, I reevaluated the patient and found that they have :improved  Complexity of problems addressed: Patient's presentation is most consistent with  acute presentation with potential threat to life or bodily function  Disposition: After consideration of the diagnostic results and the patient's response to treatment,  I feel that the patent would benefit  from discharge   .           Final Clinical Impression(s) / ED Diagnoses Final diagnoses:  Closed displaced transverse fracture of right patella, initial encounter    Rx / DC Orders ED Discharge Orders          Ordered    HYDROcodone-acetaminophen (NORCO/VICODIN) 5-325 MG tablet  Every 6 hours PRN        05/13/23 0250    ondansetron (ZOFRAN-ODT) 4 MG disintegrating tablet  Every 8 hours PRN        05/13/23 0250  Zadie Rhine, MD 05/13/23 [phone]

## 2023-05-13 DIAGNOSIS — S82031A Displaced transverse fracture of right patella, initial encounter for closed fracture: Secondary | ICD-10-CM | POA: Diagnosis not present

## 2023-05-13 LAB — CBC WITH DIFFERENTIAL/PLATELET
Abs Immature Granulocytes: 0.01 10*3/uL (ref 0.00–0.07)
Basophils Absolute: 0 10*3/uL (ref 0.0–0.1)
Basophils Relative: 0 %
Eosinophils Absolute: 0.1 10*3/uL (ref 0.0–0.5)
Eosinophils Relative: 1 %
HCT: 34.9 % — ABNORMAL LOW (ref 36.0–46.0)
Hemoglobin: 11.9 g/dL — ABNORMAL LOW (ref 12.0–15.0)
Immature Granulocytes: 0 %
Lymphocytes Relative: 33 %
Lymphs Abs: 1.8 10*3/uL (ref 0.7–4.0)
MCH: 29.8 pg (ref 26.0–34.0)
MCHC: 34.1 g/dL (ref 30.0–36.0)
MCV: 87.3 fL (ref 80.0–100.0)
Monocytes Absolute: 0.3 10*3/uL (ref 0.1–1.0)
Monocytes Relative: 6 %
Neutro Abs: 3.2 10*3/uL (ref 1.7–7.7)
Neutrophils Relative %: 60 %
Platelets: 157 10*3/uL (ref 150–400)
RBC: 4 MIL/uL (ref 3.87–5.11)
RDW: 12.3 % (ref 11.5–15.5)
WBC: 5.3 10*3/uL (ref 4.0–10.5)
nRBC: 0 % (ref 0.0–0.2)

## 2023-05-13 LAB — BASIC METABOLIC PANEL
Anion gap: 14 (ref 5–15)
BUN: 11 mg/dL (ref 8–23)
CO2: 21 mmol/L — ABNORMAL LOW (ref 22–32)
Calcium: 7.6 mg/dL — ABNORMAL LOW (ref 8.9–10.3)
Chloride: 112 mmol/L — ABNORMAL HIGH (ref 98–111)
Creatinine, Ser: 0.72 mg/dL (ref 0.44–1.00)
GFR, Estimated: >60 mL/min (ref >60)
Glucose, Bld: 93 mg/dL (ref 70–99)
Potassium: 3.2 mmol/L — ABNORMAL LOW (ref 3.5–5.1)
Sodium: 147 mmol/L — ABNORMAL HIGH (ref 135–145)

## 2023-05-13 MED ORDER — HYDROCODONE-ACETAMINOPHEN 5-325 MG PO TABS: 1.0000 | ORAL_TABLET | Freq: Four times a day (QID) | ORAL | 0 refills | Status: AC | PRN

## 2023-05-13 MED ORDER — ONDANSETRON 4 MG PO TBDP: 4.0000 mg | ORAL_TABLET | Freq: Three times a day (TID) | ORAL | 0 refills | Status: DC | PRN

## 2023-05-13 MED ORDER — KETOROLAC TROMETHAMINE 15 MG/ML IJ SOLN
15.0000 mg | Freq: Once | INTRAMUSCULAR | Status: AC
  Administered 2023-05-13: 15 mg via INTRAVENOUS
  Filled 2023-05-13: qty 1

## 2023-05-13 MED ORDER — ONDANSETRON HCL 4 MG/2ML IJ SOLN
4.0000 mg | Freq: Once | INTRAMUSCULAR | Status: AC
  Administered 2023-05-13: 4 mg via INTRAVENOUS
  Filled 2023-05-13: qty 2

## 2023-05-13 NOTE — Discharge Instructions (Addendum)
 As we discussed call Dr. Susa Simmonds later this morning for close follow-up for your knee injury.  Tell them you broke your kneecap and need close follow-up  Please see Dr. Tanya Nones in about 2 weeks to recheck your labs to see if your dehydration is improved Let him know you need your electrolytes rechecked  Be sure to keep the right leg elevated is much as possible and place ice packs on your knee frequently

## 2023-05-13 NOTE — Progress Notes (Signed)
 Orthopedic Tech Progress Note Patient Details:  Chelsea Griffith 05/25/1955 [id number]  Ortho Devices Type of Ortho Device: Crutches, Knee Immobilizer, Ace wrap, Cotton web roll Ortho Device/Splint Location: RLE Ortho Device/Splint Interventions: Ordered, Application, Adjustment   Post Interventions Patient Tolerated: Well, Ambulated well Instructions Provided: Poper ambulation with device, Care of device, Adjustment of device  [name] 05/13/2023, 2:24 AM

## 2023-05-17 DIAGNOSIS — S82031A Displaced transverse fracture of right patella, initial encounter for closed fracture: Secondary | ICD-10-CM | POA: Diagnosis not present

## 2023-05-20 DIAGNOSIS — G8918 Other acute postprocedural pain: Secondary | ICD-10-CM | POA: Diagnosis not present

## 2023-05-20 DIAGNOSIS — S82041A Displaced comminuted fracture of right patella, initial encounter for closed fracture: Secondary | ICD-10-CM | POA: Diagnosis not present

## 2023-05-20 DIAGNOSIS — X58XXXA Exposure to other specified factors, initial encounter: Secondary | ICD-10-CM | POA: Diagnosis not present

## 2023-05-20 DIAGNOSIS — Y999 Unspecified external cause status: Secondary | ICD-10-CM | POA: Diagnosis not present

## 2023-05-20 DIAGNOSIS — S82031A Displaced transverse fracture of right patella, initial encounter for closed fracture: Secondary | ICD-10-CM | POA: Diagnosis not present

## 2023-05-26 DIAGNOSIS — M25562 Pain in left knee: Secondary | ICD-10-CM | POA: Diagnosis not present

## 2023-06-10 ENCOUNTER — Ambulatory Visit: Payer: Medicare Other

## 2023-06-11 ENCOUNTER — Other Ambulatory Visit: Payer: Self-pay | Admitting: Family Medicine

## 2023-06-11 DIAGNOSIS — R058 Other specified cough: Secondary | ICD-10-CM

## 2023-06-11 DIAGNOSIS — J4521 Mild intermittent asthma with (acute) exacerbation: Secondary | ICD-10-CM

## 2023-06-11 DIAGNOSIS — J329 Chronic sinusitis, unspecified: Secondary | ICD-10-CM

## 2023-06-13 NOTE — Telephone Encounter (Signed)
 Requested by interface surescripts.  Requested Prescriptions  Pending Prescriptions Disp Refills   levocetirizine (XYZAL) 5 MG tablet [Pharmacy Med Name: LEVOCETIRIZINE 5 MG TABLET] 90 tablet 1    Sig: TAKE 1 TABLET BY MOUTH EVERY DAY IN THE EVENING     Ear, Nose, and Throat:  Antihistamines - levocetirizine dihydrochloride Passed - 06/13/2023 11:32 AM      Passed - Cr in normal range and within 360 days    Creat  Date Value Ref Range Status  04/08/2023 0.79 0.50 - 1.05 mg/dL Final   Creatinine, Ser  Date Value Ref Range Status  05/12/2023 0.72 0.44 - 1.00 mg/dL Final         Passed - eGFR is 10 or above and within 360 days    GFR, Est African American  Date Value Ref Range Status  11/20/2019 95 > OR = 60 mL/min/1.22m2 Final   GFR, Est Non African American  Date Value Ref Range Status  11/20/2019 82 > OR = 60 mL/min/1.33m2 Final   GFR, Estimated  Date Value Ref Range Status  05/12/2023 >60 >60 mL/min Final    Comment:    (NOTE) Calculated using the CKD-EPI Creatinine Equation (2021)    eGFR  Date Value Ref Range Status  04/08/2023 81 > OR = 60 mL/min/1.70m2 Final         Passed - Valid encounter within last 12 months    Recent Outpatient Visits           2 months ago Pure hypercholesterolemia   [facility] Donita Brooks, MD   10 months ago Acute bacterial rhinosinusitis   [place] [facility] Tanya Nones, Priscille Heidelberg, MD   11 months ago Rhinosinusitis   [place] [facility] Donita Brooks, MD   1 year ago Depression, unspecified depression type   [place] [facility] Donita Brooks, MD   1 year ago Routine general medical examination at a health care facility   [facility] Pickard, Priscille Heidelberg, MD

## 2023-06-14 DIAGNOSIS — M25561 Pain in right knee: Secondary | ICD-10-CM | POA: Diagnosis not present

## 2023-06-15 ENCOUNTER — Ambulatory Visit (INDEPENDENT_AMBULATORY_CARE_PROVIDER_SITE_OTHER)

## 2023-06-15 DIAGNOSIS — Z23 Encounter for immunization: Secondary | ICD-10-CM

## 2023-06-15 NOTE — Progress Notes (Signed)
 Patient is in office today for a nurse visit for Immunization. Patient Injection was given in the  Left deltoid. Patient tolerated injection well.

## 2023-06-26 ENCOUNTER — Other Ambulatory Visit: Payer: Self-pay | Admitting: Family Medicine

## 2023-06-27 NOTE — Telephone Encounter (Signed)
 Requested Prescriptions  Pending Prescriptions Disp Refills   buPROPion  (WELLBUTRIN  XL) 150 MG 24 hr tablet [Pharmacy Med Name: BUPROPION  HCL XL 150 MG TABLET] 90 tablet 0    Sig: TAKE 1 TABLET BY MOUTH EVERY DAY     Psychiatry: Antidepressants - bupropion  Passed - 06/27/2023  1:34 PM      Passed - Cr in normal range and within 360 days    Creat  Date Value Ref Range Status  04/08/2023 0.79 0.50 - 1.05 mg/dL Final   Creatinine, Ser  Date Value Ref Range Status  05/12/2023 0.72 0.44 - 1.00 mg/dL Final         Passed - AST in normal range and within 360 days    AST  Date Value Ref Range Status  04/08/2023 13 10 - 35 U/L Final         Passed - ALT in normal range and within 360 days    ALT  Date Value Ref Range Status  04/08/2023 13 6 - 29 U/L Final         Passed - Completed PHQ-2 or PHQ-9 in the last 360 days      Passed - Last BP in normal range    BP Readings from Last 1 Encounters:  05/13/23 118/69         Passed - Valid encounter within last 6 months    Recent Outpatient Visits           2 months ago Pure hypercholesterolemia   [place] [facility] Austine Lefort, MD   11 months ago Acute bacterial rhinosinusitis   [place] [facility] Cheril Cork, Cisco Crest, MD   1 year ago Rhinosinusitis   [place] [facility] Austine Lefort, MD   1 year ago Depression, unspecified depression type   [place] [facility] Austine Lefort, MD   1 year ago Routine general medical examination at a health care facility   [facility] Pickard, Cisco Crest, MD

## 2023-06-28 DIAGNOSIS — M25561 Pain in right knee: Secondary | ICD-10-CM | POA: Diagnosis not present

## 2023-07-07 DIAGNOSIS — M25561 Pain in right knee: Secondary | ICD-10-CM | POA: Diagnosis not present

## 2023-07-14 DIAGNOSIS — M25561 Pain in right knee: Secondary | ICD-10-CM | POA: Diagnosis not present

## 2023-07-15 DIAGNOSIS — S82001D Unspecified fracture of right patella, subsequent encounter for closed fracture with routine healing: Secondary | ICD-10-CM | POA: Diagnosis not present

## 2023-07-19 DIAGNOSIS — S82001D Unspecified fracture of right patella, subsequent encounter for closed fracture with routine healing: Secondary | ICD-10-CM | POA: Diagnosis not present

## 2023-07-20 DIAGNOSIS — S82001D Unspecified fracture of right patella, subsequent encounter for closed fracture with routine healing: Secondary | ICD-10-CM | POA: Diagnosis not present

## 2023-07-26 DIAGNOSIS — S82001D Unspecified fracture of right patella, subsequent encounter for closed fracture with routine healing: Secondary | ICD-10-CM | POA: Diagnosis not present

## 2023-07-28 DIAGNOSIS — S82001D Unspecified fracture of right patella, subsequent encounter for closed fracture with routine healing: Secondary | ICD-10-CM | POA: Diagnosis not present

## 2023-08-02 DIAGNOSIS — S82001D Unspecified fracture of right patella, subsequent encounter for closed fracture with routine healing: Secondary | ICD-10-CM | POA: Diagnosis not present

## 2023-08-04 DIAGNOSIS — S82001D Unspecified fracture of right patella, subsequent encounter for closed fracture with routine healing: Secondary | ICD-10-CM | POA: Diagnosis not present

## 2023-08-09 DIAGNOSIS — S82001D Unspecified fracture of right patella, subsequent encounter for closed fracture with routine healing: Secondary | ICD-10-CM | POA: Diagnosis not present

## 2023-08-11 DIAGNOSIS — S82001D Unspecified fracture of right patella, subsequent encounter for closed fracture with routine healing: Secondary | ICD-10-CM | POA: Diagnosis not present

## 2023-08-16 ENCOUNTER — Other Ambulatory Visit: Payer: Self-pay | Admitting: Family Medicine

## 2023-08-16 DIAGNOSIS — S82001D Unspecified fracture of right patella, subsequent encounter for closed fracture with routine healing: Secondary | ICD-10-CM | POA: Diagnosis not present

## 2023-08-16 DIAGNOSIS — Z1231 Encounter for screening mammogram for malignant neoplasm of breast: Secondary | ICD-10-CM

## 2023-08-18 DIAGNOSIS — S82001D Unspecified fracture of right patella, subsequent encounter for closed fracture with routine healing: Secondary | ICD-10-CM | POA: Diagnosis not present

## 2023-08-23 DIAGNOSIS — S82001D Unspecified fracture of right patella, subsequent encounter for closed fracture with routine healing: Secondary | ICD-10-CM | POA: Diagnosis not present

## 2023-08-26 DIAGNOSIS — S82001D Unspecified fracture of right patella, subsequent encounter for closed fracture with routine healing: Secondary | ICD-10-CM | POA: Diagnosis not present

## 2023-08-30 DIAGNOSIS — S82001D Unspecified fracture of right patella, subsequent encounter for closed fracture with routine healing: Secondary | ICD-10-CM | POA: Diagnosis not present

## 2023-08-31 DIAGNOSIS — Q828 Other specified congenital malformations of skin: Secondary | ICD-10-CM | POA: Diagnosis not present

## 2023-08-31 DIAGNOSIS — M792 Neuralgia and neuritis, unspecified: Secondary | ICD-10-CM | POA: Diagnosis not present

## 2023-09-02 ENCOUNTER — Ambulatory Visit
Admission: RE | Admit: 2023-09-02 | Discharge: 2023-09-02 | Disposition: A | Discharge status: Home / Self Care | Source: Ambulatory Visit | Attending: Family Medicine | Admitting: Family Medicine

## 2023-09-02 DIAGNOSIS — Z1231 Encounter for screening mammogram for malignant neoplasm of breast: Secondary | ICD-10-CM

## 2023-09-06 DIAGNOSIS — S82001D Unspecified fracture of right patella, subsequent encounter for closed fracture with routine healing: Secondary | ICD-10-CM | POA: Diagnosis not present

## 2023-09-07 ENCOUNTER — Encounter: Payer: Self-pay | Admitting: Family Medicine

## 2023-09-08 ENCOUNTER — Other Ambulatory Visit: Payer: Self-pay | Admitting: Family Medicine

## 2023-09-08 DIAGNOSIS — S82001D Unspecified fracture of right patella, subsequent encounter for closed fracture with routine healing: Secondary | ICD-10-CM | POA: Diagnosis not present

## 2023-09-08 MED ORDER — SCOPOLAMINE 1 MG/3DAYS TD PT72: 1.0000 | MEDICATED_PATCH | TRANSDERMAL | 0 refills | Status: AC

## 2023-09-12 ENCOUNTER — Telehealth: Payer: Self-pay | Admitting: Pharmacy Technician

## 2023-09-12 ENCOUNTER — Other Ambulatory Visit (HOSPITAL_COMMUNITY): Payer: Self-pay

## 2023-09-12 NOTE — Telephone Encounter (Signed)
 Prior Authorization form/request asks a question that requires your assistance. Please see the question below and advise accordingly. The PA will not be submitted until the necessary information is received.  What is the diagnosis and ICD 10 code?

## 2023-09-14 ENCOUNTER — Telehealth: Payer: Self-pay | Admitting: Family Medicine

## 2023-09-14 ENCOUNTER — Other Ambulatory Visit (HOSPITAL_COMMUNITY): Payer: Self-pay

## 2023-09-14 NOTE — Telephone Encounter (Unsigned)
 Copied from CRM [id number]. Topic: Clinical - Medication Prior Auth >> Sep 14, 2023 11:01 AM Donee H wrote: Reason for CRM: [employer] rep Madonna F. called to state auth for medication scopolamine  (TRANSDERM-SCOP) 1 MG/3DAYS was approved effective as of September 14, 2023. She stated will fax over approval letter.

## 2023-09-14 NOTE — Telephone Encounter (Signed)
 Pharmacy Patient Advocate Encounter   Received notification from CoverMyMeds that prior authorization for Scopolamine  1MG /3DAYS 72 hr patches is required/requested.   Insurance verification completed.   The patient is insured through [facility] .   Per test claim: PA required; PA submitted to above mentioned insurance via CoverMyMeds Key/confirmation #/EOC [id number] Status is pending

## 2023-09-14 NOTE — Telephone Encounter (Signed)
 Thank you :)

## 2023-09-14 NOTE — Telephone Encounter (Signed)
 Pharmacy Patient Advocate Encounter  Received notification from [facility] that Prior Authorization for Scopolamine  1MG /3DAYS 72 hr patches has been APPROVED from 09/14/23 to 09/13/24. Ran test claim, Copay is $65.41. This test claim was processed through [facility]- copay amounts may vary at other pharmacies due to pharmacy/plan contracts, or as the patient moves through the different stages of their insurance plan.   PA #/Case ID/Reference #: [id number]

## 2023-09-20 ENCOUNTER — Other Ambulatory Visit (HOSPITAL_COMMUNITY): Payer: Self-pay

## 2023-09-22 ENCOUNTER — Other Ambulatory Visit: Payer: Self-pay | Admitting: Family Medicine

## 2023-09-22 DIAGNOSIS — J069 Acute upper respiratory infection, unspecified: Secondary | ICD-10-CM

## 2023-09-22 DIAGNOSIS — L814 Other melanin hyperpigmentation: Secondary | ICD-10-CM | POA: Diagnosis not present

## 2023-09-22 DIAGNOSIS — D225 Melanocytic nevi of trunk: Secondary | ICD-10-CM | POA: Diagnosis not present

## 2023-09-22 DIAGNOSIS — L570 Actinic keratosis: Secondary | ICD-10-CM | POA: Diagnosis not present

## 2023-09-22 DIAGNOSIS — L821 Other seborrheic keratosis: Secondary | ICD-10-CM | POA: Diagnosis not present

## 2023-09-22 DIAGNOSIS — J45901 Unspecified asthma with (acute) exacerbation: Secondary | ICD-10-CM

## 2023-09-22 NOTE — Telephone Encounter (Signed)
 Prescription Request  09/22/2023  LOV: 04/12/2023  What is the name of the medication or equipment?   buPROPion  (WELLBUTRIN  XL) 150 MG 24 hr tablet [[id number]]  **90 day refill requested**  Have you contacted your pharmacy to request a refill? Yes   Which pharmacy would you like this sent to?  [id number] [address] [place], [place] - [address] [place] [address] Phone: [phone] Fax: [phone]    Patient notified that their request is being sent to the clinical staff for review and that they should receive a response within 2 business days.   Please advise pharmacist.

## 2023-09-23 ENCOUNTER — Other Ambulatory Visit: Payer: Self-pay | Admitting: Family Medicine

## 2023-09-23 MED ORDER — BUPROPION HCL ER (XL) 150 MG PO TB24: 150.0000 mg | ORAL_TABLET | Freq: Every day | ORAL | 3 refills | Status: AC

## 2023-09-23 NOTE — Telephone Encounter (Signed)
 Requested Prescriptions  Pending Prescriptions Disp Refills   montelukast  (SINGULAIR ) 10 MG tablet [Pharmacy Med Name: MONTELUKAST  SOD 10 MG TABLET] 90 tablet 1    Sig: TAKE 1 TABLET BY MOUTH EVERYDAY AT BEDTIME     Pulmonology:  Leukotriene Inhibitors Passed - 09/23/2023 11:16 AM      Passed - Valid encounter within last 12 months    Recent Outpatient Visits           5 months ago Pure hypercholesterolemia   [place] [facility] Pickard, Butler DASEN, MD   1 year ago Acute bacterial rhinosinusitis   [place] [facility] Duanne Butler DASEN, MD   1 year ago Rhinosinusitis   [place] [facility] Duanne Butler DASEN, MD   1 year ago Depression, unspecified depression type   [place] [facility] Duanne Butler DASEN, MD   1 year ago Routine general medical examination at a health care facility   [facility] Pickard, Butler DASEN, MD

## 2023-09-23 NOTE — Telephone Encounter (Signed)
 Duplicate request- filled 09/23/23 #90 3RF Requested Prescriptions  Pending Prescriptions Disp Refills   buPROPion  (WELLBUTRIN  XL) 150 MG 24 hr tablet 90 tablet 0    Sig: Take 1 tablet (150 mg total) by mouth daily.     Psychiatry: Antidepressants - bupropion  Passed - 09/23/2023  1:35 PM      Passed - Cr in normal range and within 360 days    Creat  Date Value Ref Range Status  04/08/2023 0.79 0.50 - 1.05 mg/dL Final   Creatinine, Ser  Date Value Ref Range Status  05/12/2023 0.72 0.44 - 1.00 mg/dL Final         Passed - AST in normal range and within 360 days    AST  Date Value Ref Range Status  04/08/2023 13 10 - 35 U/L Final         Passed - ALT in normal range and within 360 days    ALT  Date Value Ref Range Status  04/08/2023 13 6 - 29 U/L Final         Passed - Completed PHQ-2 or PHQ-9 in the last 360 days      Passed - Last BP in normal range    BP Readings from Last 1 Encounters:  05/13/23 118/69         Passed - Valid encounter within last 6 months    Recent Outpatient Visits           5 months ago Pure hypercholesterolemia   [place] [facility] Duanne, Butler DASEN, MD   1 year ago Acute bacterial rhinosinusitis   [place] [facility] Duanne, Butler DASEN, MD   1 year ago Rhinosinusitis   [place] [facility] Duanne Butler DASEN, MD   1 year ago Depression, unspecified depression type    [facility] Duanne Butler DASEN, MD   1 year ago Routine general medical examination at a health care facility   [place] [facility] Pickard, Butler DASEN, MD

## 2023-10-05 ENCOUNTER — Encounter: Payer: Self-pay | Admitting: Family Medicine

## 2023-10-13 DIAGNOSIS — K080 Exfoliation of teeth due to systemic causes: Secondary | ICD-10-CM | POA: Diagnosis not present

## 2023-11-08 ENCOUNTER — Encounter: Payer: Self-pay | Admitting: Family Medicine

## 2023-11-08 ENCOUNTER — Ambulatory Visit: Admitting: Family Medicine

## 2023-11-08 VITALS — BP 122/82 | HR 76 | Temp 98.2°F | Ht 63.0 in | Wt 145.0 lb

## 2023-11-08 DIAGNOSIS — H01119 Allergic dermatitis of unspecified eye, unspecified eyelid: Secondary | ICD-10-CM | POA: Diagnosis not present

## 2023-11-08 MED ORDER — HYDROCORTISONE 2.5 % EX CREA: TOPICAL_CREAM | Freq: Two times a day (BID) | CUTANEOUS | 0 refills | Status: AC

## 2023-11-08 NOTE — Progress Notes (Signed)
 Patient Office Visit  Assessment & Plan:  Eyelid dermatitis, allergic/contact -     Hydrocortisone ; Apply topically 2 (two) times daily. Apply to BID until rash is gone  Dispense: 30 g; Refill: 0   Assessment and Plan    Allergic contact dermatitis of eyelids Likely due to retinol-containing product use. Symptoms include redness, irritation, and itchiness. No infection or systemic involvement. Bilateral nature suggests contact dermatitis. - Prescribed low-potency cortisone cream (2.5%) twice daily until symptoms resolve. - Advised non-sedating antihistamines (Claritin  or Zyrtec) during the day for itching. - Discussed Benadryl  at night for itch relief; cautioned against use with trazodone . - Instructed to monitor symptoms and report via MyChart if no improvement by Friday; oral prednisone  may be prescribed.          No follow-ups on file.   Subjective:    Patient ID: Chelsea Griffith, female    DOB: 03-Feb-1956  Age: 68 y.o. MRN: [id number]  Chief Complaint  Patient presents with   Allergic Reaction    Around the eyes. Pt states it became irritated yesterday.    HPI  History of Present Illness        Chelsea Griffith is a 68 year old female who presents with eyelid irritation after using a retinol-containing product (Oil of Olay Regenerist)  She used an Oil of Olay Regenerist product containing retinol the day before yesterday. Redness and irritation on her eyelids were noticed yesterday morning and have worsened today. She has used this product for years without prior issues.  The product was applied once and has not been used since noticing the irritation. The affected area is itchy at times but does not significantly disrupt her sleep.  She has not taken any medications such as Benadryl  or Claritin  for the symptoms. She takes trazodone  for sleep but has not used any other medications for this issue.  No recent use of new soaps, detergents, or sunscreens. No  breathing difficulties, wheezing, or vision changes associated with the irritation. She has not used any OTC creams or lotions for this. She really did not know what to use  She is planning an upcoming vacation to [place]  on the 13th. She is originally from [place]  but has relocated due to personal reasons, including the passing of her mother and stepfather. Physical Exam HEENT: Vision grossly intact, no drainage. Eyelid with scaliness, confined to eyelid, not extending to cheeks. CHEST: Lungs clear to auscultation. Results Assessment & Plan Allergic contact dermatitis of eyelids Likely due to retinol-containing product use. Symptoms include redness, irritation, and itchiness. No infection or systemic involvement. Bilateral nature suggests contact dermatitis. - Prescribed low-potency cortisone cream (2.5%) twice daily until symptoms resolve. - Advised non-sedating antihistamines (Claritin  or Zyrtec) during the day for itching. - Discussed Benadryl  at night for itch relief; cautioned against use with trazodone . - Instructed to monitor symptoms and report via MyChart if no improvement by Friday; oral prednisone  may be prescribed.    The 10-year ASCVD risk score (Arnett DK, et al., 2019) is: 7.8%  Past Medical History:  Diagnosis Date   Allergy    Anxiety    Arthritis    fingers and thumbs   Asthma    Cataract    Depression    Emphysema of lung (HCC)    GERD (gastroesophageal reflux disease)    History of anemia    Hyperlipidemia    Osteoporosis    right hip   Rotator cuff tear, right    Past Surgical History:  Procedure Laterality Date   ABDOMINAL HYSTERECTOMY     CESAREAN SECTION      2 times   COLONOSCOPY     DILATION AND CURETTAGE OF UTERUS     several   ELBOW SURGERY Bilateral     2times/1 time on left elbow   EYE SURGERY     JOINT REPLACEMENT     KNEE SURGERY     right knee   PARTIAL HYSTERECTOMY     2 times   REVERSE SHOULDER ARTHROPLASTY Right 09/06/2018    Procedure: REVERSE SHOULDER ARTHROPLASTY;  Surgeon: Cristy Bonner DASEN, MD;  Location: [facility];  Service: Orthopedics;  Laterality: Right;   ROTATOR CUFF REPAIR     right shoulderx2    Social History   Tobacco Use   Smoking status: Never   Smokeless tobacco: Never  Vaping Use   Vaping status: Never Used  Substance Use Topics   Alcohol use: Yes    Alcohol/week: 1.0 standard drink of alcohol    Types: 1 Shots of liquor per week    Comment: occasional weekly drink   Drug use: No   Family History  Problem Relation Age of Onset   Hyperlipidemia Mother    Heart disease Maternal Grandmother    ADD / ADHD Son    Colon cancer Neg Hx    Esophageal cancer Neg Hx    Rectal cancer Neg Hx    Stomach cancer Neg Hx    Breast cancer Neg Hx    BRCA 1/2 Neg Hx    Allergies  Allergen Reactions   Milk-Related Compounds Nausea And Vomiting    Just milk, as an infant dropped significant amount of weight    Other     Smells may cause asthma atack    ROS    Objective:    BP 122/82   Pulse 76   Temp 98.2 F (36.8 C)   Ht 5' 3 (1.6 m)   Wt 145 lb (65.8 kg)   SpO2 98%   BMI 25.69 kg/m  BP Readings from Last 3 Encounters:  11/08/23 122/82  05/13/23 118/69  04/12/23 118/72   Wt Readings from Last 3 Encounters:  11/08/23 145 lb (65.8 kg)  05/12/23 140 lb (63.5 kg)  04/12/23 141 lb 6.4 oz (64.1 kg)    Physical Exam Vitals and nursing note reviewed.  Constitutional:      General: She is not in acute distress.    Appearance: Normal appearance.  HENT:     Head: Normocephalic.     Right Ear: Tympanic membrane, ear canal and external ear normal.     Left Ear: Tympanic membrane, ear canal and external ear normal.  Eyes:     Extraocular Movements: Extraocular movements intact.     Pupils: Pupils are equal, round, and reactive to light.  Cardiovascular:     Rate and Rhythm: Normal rate and regular rhythm.     Heart sounds: Normal heart sounds.  Pulmonary:     Effort: Pulmonary  effort is normal.     Breath sounds: Normal breath sounds.  Musculoskeletal:     Right lower leg: No edema.     Left lower leg: No edema.  Skin:    Findings: Erythema and rash present.     Comments: Diffuse erythema and scaliness over both upper eyelids and lower lids. No drainage noted.  Neurological:     General: No focal deficit present.     Mental Status: She is alert and oriented to person,  place, and time.  Psychiatric:        Mood and Affect: Mood normal.        Behavior: Behavior normal.        Judgment: Judgment normal.      No results found for any visits on 11/08/23.

## 2023-11-30 ENCOUNTER — Encounter: Payer: Self-pay | Admitting: Family Medicine

## 2023-11-30 ENCOUNTER — Telehealth: Payer: Self-pay

## 2023-11-30 ENCOUNTER — Other Ambulatory Visit: Payer: Self-pay

## 2023-11-30 ENCOUNTER — Ambulatory Visit: Admitting: Family Medicine

## 2023-11-30 VITALS — BP 126/86 | HR 81 | Temp 98.9°F | Ht 63.0 in | Wt 143.4 lb

## 2023-11-30 DIAGNOSIS — Z9189 Other specified personal risk factors, not elsewhere classified: Secondary | ICD-10-CM

## 2023-11-30 DIAGNOSIS — R059 Cough, unspecified: Secondary | ICD-10-CM

## 2023-11-30 MED ORDER — HYDROCOD POLI-CHLORPHE POLI ER 10-8 MG/5ML PO SUER: 5.0000 mL | Freq: Every evening | ORAL | 0 refills | Status: AC | PRN

## 2023-11-30 MED ORDER — BUDESONIDE-FORMOTEROL FUMARATE 80-4.5 MCG/ACT IN AERO
2.0000 | INHALATION_SPRAY | Freq: Two times a day (BID) | RESPIRATORY_TRACT | 0 refills | Status: AC
Start: 2023-11-30 — End: ?

## 2023-11-30 MED ORDER — AZITHROMYCIN 250 MG PO TABS: ORAL_TABLET | ORAL | 0 refills | Status: AC

## 2023-11-30 NOTE — Telephone Encounter (Signed)
 Copied from CRM #[id number]. Topic: Clinical - Medication Question >> Nov 30, 2023  2:24 PM Charlet HERO wrote: Reason for CRM: Patient is calling to let the Dr Aletha  know that her covid test was negative and would like to know if antibiotics can be called in to [facility] on [address]

## 2023-11-30 NOTE — Progress Notes (Signed)
 Patient Office Visit  Assessment & Plan:  Cough, unspecified type -     Hydrocod Poli-Chlorphe Poli ER; Take 5 mLs by mouth at bedtime as needed for cough.  Dispense: 120 mL; Refill: 0 -     Budesonide -Formoterol  Fumarate; Inhale 2 puffs into the lungs 2 (two) times daily.  Dispense: 1 each; Refill: 0  At increased risk of exposure to COVID-19 virus   Assessment and Plan    Suspected COVID-19 infection High suspicion for COVID-19 due to travel history and symptoms. Differential includes COVID-19 and other respiratory infections. - Advise obtaining an over-the-counter rapid COVID-19 test. - Prescribe Paxlovid if COVID-19 test is positive, noting possible metallic taste as a side effect. - Advise against babysitting granddaughter until COVID-19 status is confirmed.  Asthma and chronic obstructive pulmonary disease (COPD) Asthma and COPD. Discussed potential benefit of adding a combination inhaler like Symbicort , though noted cost concerns. - Prescribe Symbicort  inhaler.  Recurrent bronchitis Recurrent bronchitis with current symptoms of cough. Discussed that antibiotics are not appropriate if COVID-19 is confirmed. - Prescribe cough medicine.     Patient will let us  know via MyChart about her COVID result.  If positive we will send Paxlovid.  Tussionex cough syrup sent to the pharmacy.  If no improvement or worsening symptoms she is to follow up with us .  Return if symptoms worsen or fail to improve.   Subjective:    Patient ID: Chelsea Griffith, female    DOB: 12-05-1955  Age: 68 y.o. MRN: [id number]  No chief complaint on file.   HPI Discussed the use of AI scribe software for clinical note transcription with the patient, who gave verbal consent to proceed.  History of Present Illness        Chelsea Griffith is a 68 year old female with asthma/COPD who presents with a persistent cough and headache since returning from plane trip/travel last Saturday  She has a  persistent cough that has worsened recently, severe enough to disturb her sleep, causing her husband to sleep in another room. Over-the-counter cough medicine and allergy pills have not provided relief. Tussionex is the only cough medicine that has worked for her in the past.  She experiences intermittent fever and chills, sore throat, feeling alternately hot and cold, but denies having a consistent fever or chills. She has not tested for COVID-19 recently but acknowledges being in high-risk situations such as flights and airports. She has had COVID-19 at least twice before and received the initial COVID-19 vaccines.  She reports a frontal headache that is persistent but not severe. She has not taken Tylenol  or Advil for it, only her regular medications and cough medicine. Patient has had COVID in the past  She has a history of asthma and was told by her PCP that she had COPD after a chest x-ray or CT chest was performed following an injury. She uses albuterol  as a rescue inhaler and mentions a previous prescription for a steroid inhaler. She feels she might need to increase her inhaler use recently or retry Symbicort  inhaler  Her current medications include fluoxetine , bupropion , Singulair , a cholesterol medicine, and trazodone . She is unsure if she is currently taking Singulair  but does not think so  She has a history of bronchitis but denies being prone to pneumonia. She reports a change in her sense of smell and taste, which she associates with her current symptoms. Physical Exam CHEST: Lungs clear to auscultation except for cough. Results RADIOLOGY Chest CT: No suspicious  findings in the lungs, no fractures. Assessment & Plan Suspected COVID-19 infection High suspicion for COVID-19 due to travel history and symptoms. Differential includes COVID-19 and other respiratory infections. - Advise obtaining an over-the-counter rapid COVID-19 test. - Prescribe Paxlovid if COVID-19 test is positive,  noting possible metallic taste as a side effect. - Advise against babysitting granddaughter until COVID-19 status is confirmed.  Asthma and chronic obstructive pulmonary disease (COPD) Asthma and COPD. Discussed potential benefit of adding a combination inhaler like Symbicort , though noted cost concerns. - Prescribe Symbicort  inhaler.  Recurrent bronchitis Recurrent bronchitis with current symptoms of cough. Discussed that antibiotics are not appropriate if COVID-19 is confirmed. - Prescribe cough medicine.    The 10-year ASCVD risk score (Arnett DK, et al., 2019) is: 8.3%  Past Medical History:  Diagnosis Date   Allergy    Anxiety    Arthritis    fingers and thumbs   Asthma    Cataract    Depression    Emphysema of lung (HCC)    GERD (gastroesophageal reflux disease)    History of anemia    Hyperlipidemia    Osteoporosis    right hip   Rotator cuff tear, right    Past Surgical History:  Procedure Laterality Date   ABDOMINAL HYSTERECTOMY     CESAREAN SECTION      2 times   COLONOSCOPY     DILATION AND CURETTAGE OF UTERUS     several   ELBOW SURGERY Bilateral     2times/1 time on left elbow   EYE SURGERY     JOINT REPLACEMENT     KNEE SURGERY     right knee   PARTIAL HYSTERECTOMY     2 times   REVERSE SHOULDER ARTHROPLASTY Right 09/06/2018   Procedure: REVERSE SHOULDER ARTHROPLASTY;  Surgeon: Cristy Bonner DASEN, MD;  Location: [facility];  Service: Orthopedics;  Laterality: Right;   ROTATOR CUFF REPAIR     right shoulderx2    Social History   Tobacco Use   Smoking status: Never   Smokeless tobacco: Never  Vaping Use   Vaping status: Never Used  Substance Use Topics   Alcohol use: Yes    Alcohol/week: 1.0 standard drink of alcohol    Types: 1 Shots of liquor per week    Comment: occasional weekly drink   Drug use: No   Family History  Problem Relation Age of Onset   Hyperlipidemia Mother    Heart disease Maternal Grandmother    ADD / ADHD Son    Colon  cancer Neg Hx    Esophageal cancer Neg Hx    Rectal cancer Neg Hx    Stomach cancer Neg Hx    Breast cancer Neg Hx    BRCA 1/2 Neg Hx    Allergies  Allergen Reactions   Milk-Related Compounds Nausea And Vomiting    Just milk, as an infant dropped significant amount of weight    Other     Smells may cause asthma atack    ROS    Objective:    BP 126/86   Pulse 81   Temp 98.9 F (37.2 C)   Ht 5' 3 (1.6 m)   Wt 143 lb 6 oz (65 kg)   SpO2 96%   BMI 25.40 kg/m  BP Readings from Last 3 Encounters:  11/30/23 126/86  11/08/23 122/82  05/13/23 118/69   Wt Readings from Last 3 Encounters:  11/30/23 143 lb 6 oz (65 kg)  11/08/23 145 lb (  65.8 kg)  05/12/23 140 lb (63.5 kg)    Physical Exam Vitals and nursing note reviewed.  Constitutional:      General: She is not in acute distress.    Appearance: Normal appearance.     Comments: Patient has a dry cough during the exam  HENT:     Head: Normocephalic.     Right Ear: Tympanic membrane, ear canal and external ear normal.     Left Ear: Tympanic membrane, ear canal and external ear normal.     Nose: Congestion present.  Eyes:     Extraocular Movements: Extraocular movements intact.     Pupils: Pupils are equal, round, and reactive to light.  Cardiovascular:     Rate and Rhythm: Normal rate and regular rhythm.     Heart sounds: Normal heart sounds.  Pulmonary:     Effort: Pulmonary effort is normal.     Breath sounds: Normal breath sounds. No wheezing or rhonchi.  Musculoskeletal:     Right lower leg: No edema.     Left lower leg: No edema.  Neurological:     General: No focal deficit present.     Mental Status: She is alert and oriented to person, place, and time.  Psychiatric:        Mood and Affect: Mood normal.        Behavior: Behavior normal.      No results found for any visits on 11/30/23.

## 2023-12-22 DIAGNOSIS — M79671 Pain in right foot: Secondary | ICD-10-CM | POA: Diagnosis not present

## 2023-12-22 DIAGNOSIS — Q828 Other specified congenital malformations of skin: Secondary | ICD-10-CM | POA: Diagnosis not present

## 2023-12-22 DIAGNOSIS — M792 Neuralgia and neuritis, unspecified: Secondary | ICD-10-CM | POA: Diagnosis not present

## 2023-12-26 ENCOUNTER — Ambulatory Visit: Payer: Self-pay

## 2023-12-26 NOTE — Telephone Encounter (Signed)
 FYI Only or Action Required?: FYI only for provider.  Patient was last seen in primary care on 11/30/2023 by Aletha Bene, MD.  Called Nurse Triage reporting Cough.  Symptoms began several months ago. Worse over last couple weeks.  Interventions attempted: Nothing.  Symptoms are: gradually worsening.  Triage Disposition: See Physician Within 24 Hours  Patient/caregiver understands and will follow disposition?: Yes, will follow disposition  Copied from CRM #[id number]. Topic: Clinical - Red Word Triage >> Dec 26, 2023 12:25 PM Chelsea Griffith wrote: Patient called with complaints of cough, worsening, occasional phlegm Reason for Disposition  SEVERE coughing spells (e.g., whooping sound after coughing, vomiting after coughing)  Answer Assessment - Initial Assessment Questions 1. ONSET: When did the cough begin?      About 6 months ago 2. SEVERITY: How bad is the cough today?      moderate 3. SPUTUM: Describe the color of your sputum (e.g., none, dry cough; clear, white, yellow, green)     Unsure, does not look 4. HEMOPTYSIS: Are you coughing up any blood? If so ask: How much? (e.g., flecks, streaks, tablespoons, etc.)     unsure 5. DIFFICULTY BREATHING: Are you having difficulty breathing? If Yes, ask: How bad is it? (e.g., mild, moderate, severe)      denies 6. FEVER: Do you have a fever? If Yes, ask: What is your temperature, how was it measured, and when did it start?     denies 7. CARDIAC HISTORY: Do you have any history of heart disease? (e.g., heart attack, congestive heart failure)      denies 8. LUNG HISTORY: Do you have any history of lung disease?  (e.g., pulmonary embolus, asthma, emphysema)     asthma 10. OTHER SYMPTOMS: Do you have any other symptoms? (e.g., runny nose, wheezing, chest pain)       Dry/itchy throat  Offered appt today, pt declined, would prefer to be scheduled with PCP.  Protocols used: Cough - Chronic-A-AH

## 2023-12-27 ENCOUNTER — Encounter: Payer: Self-pay | Admitting: Family Medicine

## 2023-12-27 ENCOUNTER — Ambulatory Visit (INDEPENDENT_AMBULATORY_CARE_PROVIDER_SITE_OTHER): Admitting: Family Medicine

## 2023-12-27 DIAGNOSIS — J329 Chronic sinusitis, unspecified: Secondary | ICD-10-CM | POA: Diagnosis not present

## 2023-12-27 DIAGNOSIS — R058 Other specified cough: Secondary | ICD-10-CM | POA: Diagnosis not present

## 2023-12-27 DIAGNOSIS — J4521 Mild intermittent asthma with (acute) exacerbation: Secondary | ICD-10-CM | POA: Diagnosis not present

## 2023-12-27 DIAGNOSIS — Q828 Other specified congenital malformations of skin: Secondary | ICD-10-CM | POA: Insufficient documentation

## 2023-12-27 MED ORDER — BENZONATATE 100 MG PO CAPS: 200.0000 mg | ORAL_CAPSULE | Freq: Three times a day (TID) | ORAL | 0 refills | Status: AC | PRN

## 2023-12-27 MED ORDER — FLUTICASONE PROPIONATE 50 MCG/ACT NA SUSP: NASAL | 2 refills | Status: AC

## 2023-12-27 MED ORDER — PANTOPRAZOLE SODIUM 40 MG PO TBEC: 40.0000 mg | DELAYED_RELEASE_TABLET | Freq: Every day | ORAL | 3 refills | Status: AC

## 2023-12-27 MED ORDER — LEVOCETIRIZINE DIHYDROCHLORIDE 5 MG PO TABS: 5.0000 mg | ORAL_TABLET | Freq: Every evening | ORAL | 3 refills | Status: AC

## 2023-12-27 NOTE — Progress Notes (Signed)
 Subjective:    Patient ID: Chelsea Griffith, female    DOB: Mar 02, 1956, 68 y.o.   MRN: [id number]  HPI Patient has been dealing with a chronic cough off and on for years.  I suspect that she has upper airway cough syndrome coupled with postnasal drip, laryngopharyngeal reflux, and underlying asthma.  She states that she has been battling this cough for the last 4 to 5 months.  Cough is nonproductive.  She denies any pleurisy or hemoptysis.  She denies any fevers or chills.  She denies any purulent sputum.  She does endorse postnasal drip and drainage on a daily basis.  She has not been taking her Flonase .  She has not been taking Xyzal .  She is inconsistently taking her Symbicort .  She discontinued her proton pump inhibitor Past Medical History:  Diagnosis Date   Allergy    Anxiety    Arthritis    fingers and thumbs   Asthma    Cataract    Depression    Emphysema of lung (HCC)    GERD (gastroesophageal reflux disease)    History of anemia    Hyperlipidemia    Osteoporosis    right hip   Rotator cuff tear, right    Past Surgical History:  Procedure Laterality Date   ABDOMINAL HYSTERECTOMY     CESAREAN SECTION      2 times   COLONOSCOPY     DILATION AND CURETTAGE OF UTERUS     several   ELBOW SURGERY Bilateral     2times/1 time on left elbow   EYE SURGERY     JOINT REPLACEMENT     KNEE SURGERY     right knee   PARTIAL HYSTERECTOMY     2 times   REVERSE SHOULDER ARTHROPLASTY Right 09/06/2018   Procedure: REVERSE SHOULDER ARTHROPLASTY;  Surgeon: Cristy Bonner DASEN, MD;  Location: [facility];  Service: Orthopedics;  Laterality: Right;   ROTATOR CUFF REPAIR     right shoulderx2    Current Outpatient Medications on File Prior to Visit  Medication Sig Dispense Refill   albuterol  (PROAIR  HFA) 108 (90 Base) MCG/ACT inhaler Inhale 2 puffs into the lungs every 4 (four) hours as needed for wheezing or shortness of breath. 18 g 2   alendronate  (FOSAMAX ) 10 MG tablet Take 1 tablet (10 mg  total) by mouth daily before breakfast. Take with a full glass of water  on an empty stomach. 90 tablet 3   budesonide -formoterol  (SYMBICORT ) 80-4.5 MCG/ACT inhaler Inhale 2 puffs into the lungs 2 (two) times daily. 1 each 0   buPROPion  (WELLBUTRIN  XL) 150 MG 24 hr tablet Take 1 tablet (150 mg total) by mouth daily. 90 tablet 3   chlorpheniramine-HYDROcodone  (TUSSIONEX) 10-8 MG/5ML Take 5 mLs by mouth at bedtime as needed for cough. 120 mL 0   cholecalciferol (VITAMIN D3) 25 MCG (1000 UNIT) tablet Take 1,000 Units by mouth daily.     FLUoxetine  (PROZAC ) 20 MG capsule TAKE 1 CAPSULE BY MOUTH EVERY DAY 90 capsule 3   fluticasone  (FLONASE ) 50 MCG/ACT nasal spray PLACE 2 SPRAYS INTO EACH NOSTRIL DAILY. 48 mL 2   HYDROcodone -acetaminophen  (NORCO/VICODIN) 5-325 MG tablet Take 1 tablet by mouth every 6 (six) hours as needed. 10 tablet 0   hydrocortisone  2.5 % cream Apply topically 2 (two) times daily. Apply to BID until rash is gone 30 g 0   levocetirizine (XYZAL ) 5 MG tablet TAKE 1 TABLET BY MOUTH EVERY DAY IN THE EVENING 90 tablet 1  montelukast  (SINGULAIR ) 10 MG tablet TAKE 1 TABLET BY MOUTH EVERYDAY AT BEDTIME 90 tablet 1   Multiple Vitamin (MULTIVITAMIN WITH MINERALS) TABS tablet Take 2 tablets by mouth daily.     Multiple Vitamins-Minerals (PRESERVISION AREDS 2) CAPS Take 1 capsule by mouth 2 (two) times a day.     pravastatin  (PRAVACHOL ) 20 MG tablet Take 1 tablet (20 mg total) by mouth daily. 90 tablet 3   scopolamine  (TRANSDERM-SCOP) 1 MG/3DAYS Place 1 patch (1.5 mg total) onto the skin every 3 (three) days. 10 patch 0   traZODone  (DESYREL ) 100 MG tablet Take 1 tablet (100 mg total) by mouth at bedtime as needed for sleep. 90 tablet 3   No current facility-administered medications on file prior to visit.   Allergies  Allergen Reactions   Milk-Related Compounds Nausea And Vomiting    Just milk, as an infant dropped significant amount of weight    Other     Smells may cause asthma atack    Social History   Socioeconomic History   Marital status: Married    Spouse name: Not on file   Number of children: Not on file   Years of education: Not on file   Highest education level: Bachelor's degree (e.g., BA, AB, BS)  Occupational History   Not on file  Tobacco Use   Smoking status: Never   Smokeless tobacco: Never  Vaping Use   Vaping status: Never Used  Substance and Sexual Activity   Alcohol use: Yes    Alcohol/week: 1.0 standard drink of alcohol    Types: 1 Shots of liquor per week    Comment: occasional weekly drink   Drug use: No   Sexual activity: Yes    Comment: single  Other Topics Concern   Not on file  Social History Narrative   Not on file   Social Drivers of Health   Financial Resource Strain: Low Risk  (04/08/2023)   Overall Financial Resource Strain (CARDIA)    Difficulty of Paying Living Expenses: Not hard at all  Food Insecurity: No Food Insecurity (04/08/2023)   Hunger Vital Sign    Worried About Running Out of Food in the Last Year: Never true    Ran Out of Food in the Last Year: Never true  Transportation Needs: No Transportation Needs (04/08/2023)   PRAPARE - Administrator, Civil Service (Medical): No    Lack of Transportation (Non-Medical): No  Physical Activity: Inactive (04/08/2023)   Exercise Vital Sign    Days of Exercise per Week: 0 days    Minutes of Exercise per Session: 30 min  Stress: No Stress Concern Present (12/30/2022)   [employer] of Occupational Health - Occupational Stress Questionnaire    Feeling of Stress : Not at all  Social Connections: Moderately Isolated (04/08/2023)   Social Connection and Isolation Panel    Frequency of Communication with Friends and Family: Once a week    Frequency of Social Gatherings with Friends and Family: Once a week    Attends Religious Services: More than 4 times per year    Active Member of [employer] or Organizations: No    Attends Banker Meetings: Never     Marital Status: Married  Catering manager Violence: Not At Risk (12/30/2022)   Humiliation, Afraid, Rape, and Kick questionnaire    Fear of Current or Ex-Partner: No    Emotionally Abused: No    Physically Abused: No    Sexually Abused: No  Review of Systems  All other systems reviewed and are negative.      Objective:   Physical Exam Vitals reviewed.  Constitutional:      General: She is not in acute distress.    Appearance: She is well-developed. She is not diaphoretic.  HENT:     Right Ear: Tympanic membrane, ear canal and external ear normal.     Left Ear: Tympanic membrane, ear canal and external ear normal.     Nose: Congestion and rhinorrhea present.     Right Sinus: No maxillary sinus tenderness or frontal sinus tenderness.     Left Sinus: No maxillary sinus tenderness or frontal sinus tenderness.     Mouth/Throat:     Pharynx: No oropharyngeal exudate or posterior oropharyngeal erythema.  Eyes:     Conjunctiva/sclera: Conjunctivae normal.  Cardiovascular:     Rate and Rhythm: Normal rate and regular rhythm.     Heart sounds: Normal heart sounds. No murmur heard. Pulmonary:     Effort: Pulmonary effort is normal.     Breath sounds: No decreased breath sounds, wheezing, rhonchi or rales.  Chest:     Chest wall: No tenderness.  Musculoskeletal:     Cervical back: Neck supple.  Lymphadenopathy:     Cervical: No cervical adenopathy.          Rhinosinusitis - Plan: levocetirizine (XYZAL ) 5 MG tablet, fluticasone  (FLONASE ) 50 MCG/ACT nasal spray  Upper airway cough syndrome - Plan: levocetirizine (XYZAL ) 5 MG tablet  Mild intermittent asthma with acute exacerbation - Plan: levocetirizine (XYZAL ) 5 MG tablet Patient has multifactorial chronic cough.  I suspect that this is most likely upper airway cough syndrome.  Resume Flonase  2 sprays each nostril daily.  Resume Xyzal  5 mg daily.  Resume pantoprazole  40 mg daily.  Elevate the head of the bed 2 inches.   Use Tessalon  200 mg every 8 hours to break the cycle of frequent coughing which is likely exacerbating the irritation in her airways.  Recheck in 3 to 4 weeks to see if cough is improving. Assessment & Plan:

## 2024-01-05 ENCOUNTER — Ambulatory Visit: Payer: Medicare Other | Admitting: *Deleted

## 2024-01-05 VITALS — Ht 63.0 in | Wt 144.0 lb

## 2024-01-05 DIAGNOSIS — Z780 Asymptomatic menopausal state: Secondary | ICD-10-CM

## 2024-01-05 DIAGNOSIS — Z0000 Encounter for general adult medical examination without abnormal findings: Secondary | ICD-10-CM

## 2024-01-05 NOTE — Patient Instructions (Signed)
 Chelsea Griffith , Thank you for taking time to come for your Medicare Wellness Visit. I appreciate your ongoing commitment to your health goals. Please review the following plan we discussed and let me know if I can assist you in the future.   Screening recommendations/referrals: Colonoscopy:  Mammogram:  Bone Density:  Recommended yearly ophthalmology/optometry visit for glaucoma screening and checkup Recommended yearly dental visit for hygiene and checkup  Vaccinations: Influenza vaccine:  Pneumococcal vaccine:  Tdap vaccine:  Shingles vaccine:         Preventive Care 65 Years and Older, Female Preventive care refers to lifestyle choices and visits with your health care provider that can promote health and wellness. What does preventive care include? A yearly physical exam. This is also called an annual well check. Dental exams once or twice a year. Routine eye exams. Ask your health care provider how often you should have your eyes checked. Personal lifestyle choices, including: Daily care of your teeth and gums. Regular physical activity. Eating a healthy diet. Avoiding tobacco and drug use. Limiting alcohol use. Practicing safe sex. Taking low-dose aspirin every day. Taking vitamin and mineral supplements as recommended by your health care provider. What happens during an annual well check? The services and screenings done by your health care provider during your annual well check will depend on your age, overall health, lifestyle risk factors, and family history of disease. Counseling  Your health care provider may ask you questions about your: Alcohol use. Tobacco use. Drug use. Emotional well-being. Home and relationship well-being. Sexual activity. Eating habits. History of falls. Memory and ability to understand (cognition). Work and work astronomer. Reproductive health. Screening  You may have the following tests or measurements: Height, weight, and  BMI. Blood pressure. Lipid and cholesterol levels. These may be checked every 5 years, or more frequently if you are over 42 years old. Skin check. Lung cancer screening. You may have this screening every year starting at age 82 if you have a 30-pack-year history of smoking and currently smoke or have quit within the past 15 years. Fecal occult blood test (FOBT) of the stool. You may have this test every year starting at age 70. Flexible sigmoidoscopy or colonoscopy. You may have a sigmoidoscopy every 5 years or a colonoscopy every 10 years starting at age 7. Hepatitis C blood test. Hepatitis B blood test. Sexually transmitted disease (STD) testing. Diabetes screening. This is done by checking your blood sugar (glucose) after you have not eaten for a while (fasting). You may have this done every 1-3 years. Bone density scan. This is done to screen for osteoporosis. You may have this done starting at age 24. Mammogram. This may be done every 1-2 years. Talk to your health care provider about how often you should have regular mammograms. Talk with your health care provider about your test results, treatment options, and if necessary, the need for more tests. Vaccines  Your health care provider may recommend certain vaccines, such as: Influenza vaccine. This is recommended every year. Tetanus, diphtheria, and acellular pertussis (Tdap, Td) vaccine. You may need a Td booster every 10 years. Zoster vaccine. You may need this after age 46. Pneumococcal 13-valent conjugate (PCV13) vaccine. One dose is recommended after age 91. Pneumococcal polysaccharide (PPSV23) vaccine. One dose is recommended after age 82. Talk to your health care provider about which screenings and vaccines you need and how often you need them. This information is not intended to replace advice given to you by your  health care provider. Make sure you discuss any questions you have with your health care provider. Document  Released: 03/21/2015 Document Revised: 11/12/2015 Document Reviewed: 12/24/2014 Elsevier Interactive Patient Education  2017 [employer].  Fall Prevention in the Home Falls can cause injuries. They can happen to people of all ages. There are many things you can do to make your home safe and to help prevent falls. What can I do on the outside of my home? Regularly fix the edges of walkways and driveways and fix any cracks. Remove anything that might make you trip as you walk through a door, such as a raised step or threshold. Trim any bushes or trees on the path to your home. Use bright outdoor lighting. Clear any walking paths of anything that might make someone trip, such as rocks or tools. Regularly check to see if handrails are loose or broken. Make sure that both sides of any steps have handrails. Any raised decks and porches should have guardrails on the edges. Have any leaves, snow, or ice cleared regularly. Use sand or salt on walking paths during winter. Clean up any spills in your garage right away. This includes oil or grease spills. What can I do in the bathroom? Use night lights. Install grab bars by the toilet and in the tub and shower. Do not use towel bars as grab bars. Use non-skid mats or decals in the tub or shower. If you need to sit down in the shower, use a plastic, non-slip stool. Keep the floor dry. Clean up any water  that spills on the floor as soon as it happens. Remove soap buildup in the tub or shower regularly. Attach bath mats securely with double-sided non-slip rug tape. Do not have throw rugs and other things on the floor that can make you trip. What can I do in the bedroom? Use night lights. Make sure that you have a light by your bed that is easy to reach. Do not use any sheets or blankets that are too big for your bed. They should not hang down onto the floor. Have a firm chair that has side arms. You can use this for support while you get dressed. Do  not have throw rugs and other things on the floor that can make you trip. What can I do in the kitchen? Clean up any spills right away. Avoid walking on wet floors. Keep items that you use a lot in easy-to-reach places. If you need to reach something above you, use a strong step stool that has a grab bar. Keep electrical cords out of the way. Do not use floor polish or wax that makes floors slippery. If you must use wax, use non-skid floor wax. Do not have throw rugs and other things on the floor that can make you trip. What can I do with my stairs? Do not leave any items on the stairs. Make sure that there are handrails on both sides of the stairs and use them. Fix handrails that are broken or loose. Make sure that handrails are as long as the stairways. Check any carpeting to make sure that it is firmly attached to the stairs. Fix any carpet that is loose or worn. Avoid having throw rugs at the top or bottom of the stairs. If you do have throw rugs, attach them to the floor with carpet tape. Make sure that you have a light switch at the top of the stairs and the bottom of the stairs. If you do  not have them, ask someone to add them for you. What else can I do to help prevent falls? Wear shoes that: Do not have high heels. Have rubber bottoms. Are comfortable and fit you well. Are closed at the toe. Do not wear sandals. If you use a stepladder: Make sure that it is fully opened. Do not climb a closed stepladder. Make sure that both sides of the stepladder are locked into place. Ask someone to hold it for you, if possible. Clearly mark and make sure that you can see: Any grab bars or handrails. First and last steps. Where the edge of each step is. Use tools that help you move around (mobility aids) if they are needed. These include: Canes. Walkers. Scooters. Crutches. Turn on the lights when you go into a dark area. Replace any light bulbs as soon as they burn out. Set up your  furniture so you have a clear path. Avoid moving your furniture around. If any of your floors are uneven, fix them. If there are any pets around you, be aware of where they are. Review your medicines with your doctor. Some medicines can make you feel dizzy. This can increase your chance of falling. Ask your doctor what other things that you can do to help prevent falls. This information is not intended to replace advice given to you by your health care provider. Make sure you discuss any questions you have with your health care provider. Document Released: 12/19/2008 Document Revised: 07/31/2015 Document Reviewed: 03/29/2014 Elsevier Interactive Patient Education  2017 [employer].

## 2024-01-05 NOTE — Progress Notes (Signed)
 Subjective:   Chelsea Griffith is a 68 y.o. female who presents for Medicare Annual (Subsequent) preventive examination.  Visit Complete: Virtual I connected with  Teri Vicki Porras on 01/05/24 by a audio enabled telemedicine application and verified that I am speaking with the correct person using two identifiers.  Patient Location: Home  Provider Location: Home Office  I discussed the limitations of evaluation and management by telemedicine. The patient expressed understanding and agreed to proceed.  Vital Signs: Because this visit was a virtual/telehealth visit, some criteria may be missing or patient reported. Any vitals not documented were not able to be obtained and vitals that have been documented are patient reported.   Cardiac Risk Factors include: advanced age (>81men, >52 women);obesity (BMI >30kg/m2)     Objective:    Today's Vitals   01/05/24 1023  Weight: 144 lb (65.3 kg)  Height: 5' 3 (1.6 m)   Body mass index is 25.51 kg/m.     01/05/2024   10:24 AM 12/30/2022   10:18 AM 12/06/2021    4:00 PM 12/12/2020    9:31 AM 12/16/2018   12:15 AM 09/06/2018    3:49 PM 09/01/2018   10:21 AM  Advanced Directives  Does Patient Have a Medical Advance Directive? Yes No No Yes No Yes Yes  Type of Advance Directive Living will     Living will;Healthcare Power of [employer] Power of [place];Living will  Does patient want to make changes to medical advance directive?    No - Patient declined  No - Patient declined    Copy of Healthcare Power of Attorney in Chart?      No - copy requested  No - copy requested   Would patient like information on creating a medical advance directive?  Yes (MAU/Ambulatory/Procedural Areas - Information given)   No - Guardian declined       Data saved with a previous flowsheet row definition    Current Medications (verified) Outpatient Encounter Medications as of 01/05/2024  Medication Sig   albuterol  (PROAIR  HFA) 108 (90 Base)  MCG/ACT inhaler Inhale 2 puffs into the lungs every 4 (four) hours as needed for wheezing or shortness of breath.   alendronate  (FOSAMAX ) 10 MG tablet Take 1 tablet (10 mg total) by mouth daily before breakfast. Take with a full glass of water  on an empty stomach.   benzonatate  (TESSALON  PERLES) 100 MG capsule Take 2 capsules (200 mg total) by mouth 3 (three) times daily as needed for cough.   budesonide -formoterol  (SYMBICORT ) 80-4.5 MCG/ACT inhaler Inhale 2 puffs into the lungs 2 (two) times daily.   buPROPion  (WELLBUTRIN  XL) 150 MG 24 hr tablet Take 1 tablet (150 mg total) by mouth daily.   chlorpheniramine-HYDROcodone  (TUSSIONEX) 10-8 MG/5ML Take 5 mLs by mouth at bedtime as needed for cough.   cholecalciferol (VITAMIN D3) 25 MCG (1000 UNIT) tablet Take 1,000 Units by mouth daily.   FLUoxetine  (PROZAC ) 20 MG capsule TAKE 1 CAPSULE BY MOUTH EVERY DAY   fluticasone  (FLONASE ) 50 MCG/ACT nasal spray PLACE 2 SPRAYS INTO EACH NOSTRIL DAILY.   HYDROcodone -acetaminophen  (NORCO/VICODIN) 5-325 MG tablet Take 1 tablet by mouth every 6 (six) hours as needed.   hydrocortisone  2.5 % cream Apply topically 2 (two) times daily. Apply to BID until rash is gone   levocetirizine (XYZAL ) 5 MG tablet Take 1 tablet (5 mg total) by mouth every evening.   montelukast  (SINGULAIR ) 10 MG tablet TAKE 1 TABLET BY MOUTH EVERYDAY AT BEDTIME   Multiple Vitamin (  MULTIVITAMIN WITH MINERALS) TABS tablet Take 2 tablets by mouth daily.   Multiple Vitamins-Minerals (PRESERVISION AREDS 2) CAPS Take 1 capsule by mouth 2 (two) times a day.   pantoprazole  (PROTONIX ) 40 MG tablet Take 1 tablet (40 mg total) by mouth daily.   pravastatin  (PRAVACHOL ) 20 MG tablet Take 1 tablet (20 mg total) by mouth daily.   scopolamine  (TRANSDERM-SCOP) 1 MG/3DAYS Place 1 patch (1.5 mg total) onto the skin every 3 (three) days.   traZODone  (DESYREL ) 100 MG tablet Take 1 tablet (100 mg total) by mouth at bedtime as needed for sleep.   meloxicam  (MOBIC ) 15  MG tablet Take 15 mg by mouth daily. (Patient not taking: Reported on 01/05/2024)   No facility-administered encounter medications on file as of 01/05/2024.    Allergies (verified) Codeine, Milk-related compounds, and Other   History: Past Medical History:  Diagnosis Date   Allergy    Anxiety    Arthritis    fingers and thumbs   Asthma    Cataract    Depression    Emphysema of lung (HCC)    GERD (gastroesophageal reflux disease)    History of anemia    Hyperlipidemia    Osteoporosis    right hip   Rotator cuff tear, right    Past Surgical History:  Procedure Laterality Date   ABDOMINAL HYSTERECTOMY     CESAREAN SECTION      2 times   COLONOSCOPY     DILATION AND CURETTAGE OF UTERUS     several   ELBOW SURGERY Bilateral     2times/1 time on left elbow   EYE SURGERY     JOINT REPLACEMENT     KNEE SURGERY     right knee   PARTIAL HYSTERECTOMY     2 times   REVERSE SHOULDER ARTHROPLASTY Right 09/06/2018   Procedure: REVERSE SHOULDER ARTHROPLASTY;  Surgeon: Cristy Bonner DASEN, MD;  Location: [facility];  Service: Orthopedics;  Laterality: Right;   ROTATOR CUFF REPAIR     right shoulderx2    Family History  Problem Relation Age of Onset   Hyperlipidemia Mother    Heart disease Maternal Grandmother    ADD / ADHD Son    Colon cancer Neg Hx    Esophageal cancer Neg Hx    Rectal cancer Neg Hx    Stomach cancer Neg Hx    Breast cancer Neg Hx    BRCA 1/2 Neg Hx    Social History   Socioeconomic History   Marital status: Married    Spouse name: Not on file   Number of children: Not on file   Years of education: Not on file   Highest education level: Bachelor's degree (e.g., BA, AB, BS)  Occupational History   Not on file  Tobacco Use   Smoking status: Never   Smokeless tobacco: Never  Vaping Use   Vaping status: Never Used  Substance and Sexual Activity   Alcohol use: Yes    Alcohol/week: 1.0 standard drink of alcohol    Types: 1 Shots of liquor per week     Comment: occasional weekly drink   Drug use: No   Sexual activity: Yes    Comment: single  Other Topics Concern   Not on file  Social History Narrative   Not on file   Social Drivers of Health   Financial Resource Strain: Low Risk  (01/05/2024)   Overall Financial Resource Strain (CARDIA)    Difficulty of Paying Living Expenses: Not hard  at all  Food Insecurity: No Food Insecurity (01/05/2024)   Hunger Vital Sign    Worried About Running Out of Food in the Last Year: Never true    Ran Out of Food in the Last Year: Never true  Transportation Needs: No Transportation Needs (01/05/2024)   PRAPARE - Administrator, Civil Service (Medical): No    Lack of Transportation (Non-Medical): No  Physical Activity: Inactive (01/05/2024)   Exercise Vital Sign    Days of Exercise per Week: 0 days    Minutes of Exercise per Session: 0 min  Stress: No Stress Concern Present (01/05/2024)   [employer] of Occupational Health - Occupational Stress Questionnaire    Feeling of Stress: Not at all  Social Connections: Moderately Integrated (01/05/2024)   Social Connection and Isolation Panel    Frequency of Communication with Friends and Family: Three times a week    Frequency of Social Gatherings with Friends and Family: Once a week    Attends Religious Services: More than 4 times per year    Active Member of [employer] or Organizations: No    Attends Engineer, Structural: Never    Marital Status: Married    Tobacco Counseling Counseling given: Not Answered   Clinical Intake:  Pre-visit preparation completed: Yes  Pain : No/denies pain     Diabetes: No  How often do you need to have someone help you when you read instructions, pamphlets, or other written materials from your doctor or pharmacy?: 1 - Never  Interpreter Needed?: No  Information entered by :: Mliss Graff LPN   Activities of Daily Living    01/05/2024   10:28 AM  In your present state of  health, do you have any difficulty performing the following activities:  Hearing? 0  Vision? 0  Difficulty concentrating or making decisions? 0  Walking or climbing stairs? 0  Dressing or bathing? 0  Doing errands, shopping? 0  Preparing Food and eating ? N  Using the Toilet? N  In the past six months, have you accidently leaked urine? N  Do you have problems with loss of bowel control? N  Managing your Medications? N  Managing your Finances? N  Housekeeping or managing your Housekeeping? N    Patient Care Team: Duanne Butler DASEN, MD as PCP - General (Family Medicine)  Indicate any recent Medical Services you may have received from other than [facility] providers in the past year (date may be approximate).     Assessment:   This is a routine wellness examination for Marieke.  Hearing/Vision screen Hearing Screening - Comments:: No trouble hearing Vision Screening - Comments:: Up to date [facility]   Goals Addressed             This Visit's Progress    Patient Stated       Continue current lifestyle Loose some weight     Remain active and independent   On track      Depression Screen    01/05/2024   10:25 AM 04/12/2023   10:18 AM 12/30/2022   10:19 AM 05/07/2022   11:02 AM 12/15/2021    9:38 AM 12/12/2020    9:27 AM 11/22/2019   10:31 AM  PHQ 2/9 Scores  PHQ - 2 Score 0 0 4 4 1  0 0  PHQ- 9 Score 1  8 10        Fall Risk    01/05/2024   10:21 AM 04/12/2023   10:18 AM  12/30/2022   10:18 AM 05/07/2022   11:02 AM 12/15/2021    9:37 AM  Fall Risk   Falls in the past year? 1 0 0 0 0  Number falls in past yr: 0 0 0 0 0  Injury with Fall? 1 0 0 0 0  Risk for fall due to :  No Fall Risks No Fall Risks No Fall Risks History of fall(s)  Follow up Falls evaluation completed;Education provided;Falls prevention discussed Falls prevention discussed Falls prevention discussed;Education provided;Falls evaluation completed Falls prevention discussed Falls prevention  discussed      Data saved with a previous flowsheet row definition    MEDICARE RISK AT HOME: Medicare Risk at Home Any stairs in or around the home?: Yes If so, are there any without handrails?: No Home free of loose throw rugs in walkways, pet beds, electrical cords, etc?: Yes Adequate lighting in your home to reduce risk of falls?: Yes Life alert?: No Use of a cane, walker or w/c?: No Grab bars in the bathroom?: Yes Shower chair or bench in shower?: Yes Elevated toilet seat or a handicapped toilet?: No  TIMED UP AND GO:  Was the test performed?  No    Cognitive Function:        01/05/2024   10:27 AM 12/30/2022   10:18 AM  6CIT Screen  What Year? 0 points 0 points  What month? 0 points 0 points  What time? 0 points 0 points  Count back from 20 0 points 0 points  Months in reverse 2 points 0 points  Repeat phrase 0 points 0 points  Total Score 2 points 0 points    Immunizations Immunization History  Administered Date(s) Administered   Fluad Quad(high Dose 65+) 11/22/2019, 12/12/2020, 12/15/2021   Fluad Trivalent(High Dose 65+) 04/12/2023   Influenza,inj,Quad PF,6+ Mos 04/04/2017, 04/07/2018, 11/01/2018   PFIZER(Purple Top)SARS-COV-2 Vaccination 05/07/2019, 05/28/2019, 02/06/2020   PNEUMOCOCCAL CONJUGATE-20 12/15/2021   Pfizer(Comirnaty)Fall Seasonal Vaccine 12 years and older 12/29/2021   Respiratory Syncytial Virus Vaccine,Recomb Aduvanted(Arexvy) 12/29/2021   Tdap 04/13/2013   Zoster Recombinant(Shingrix ) 04/12/2023, 06/15/2023    TDAP status: Due, Education has been provided regarding the importance of this vaccine. Advised may receive this vaccine at local pharmacy or Health Dept. Aware to provide a copy of the vaccination record if obtained from local pharmacy or Health Dept. Verbalized acceptance and understanding.  Flu Vaccine status: Due, Education has been provided regarding the importance of this vaccine. Advised may receive this vaccine at local  pharmacy or Health Dept. Aware to provide a copy of the vaccination record if obtained from local pharmacy or Health Dept. Verbalized acceptance and understanding.  Pneumococcal vaccine status: Up to date  Covid-19 vaccine status: Declined, Education has been provided regarding the importance of this vaccine but patient still declined. Advised may receive this vaccine at local pharmacy or Health Dept.or vaccine clinic. Aware to provide a copy of the vaccination record if obtained from local pharmacy or Health Dept. Verbalized acceptance and understanding.  Qualifies for Shingles Vaccine? No   Zostavax completed Yes   Shingrix  Completed?: Yes  Screening Tests Health Maintenance  Topic Date Due   DTaP/Tdap/Td (2 - Td or Tdap) 04/14/2023   COVID-19 Vaccine (5 - 2025-26 season) 11/07/2023   Influenza Vaccine  06/05/2024 (Originally 10/07/2023)   Colonoscopy  07/28/2024   Medicare Annual Wellness (AWV)  01/04/2025   Mammogram  09/01/2025   Pneumococcal Vaccine: 50+ Years  Completed   DEXA SCAN  Completed   Hepatitis C  Screening  Completed   Zoster Vaccines- Shingrix   Completed   Meningococcal B Vaccine  Aged Out    Health Maintenance  Health Maintenance Due  Topic Date Due   DTaP/Tdap/Td (2 - Td or Tdap) 04/14/2023   COVID-19 Vaccine (5 - 2025-26 season) 11/07/2023    Colorectal cancer screening: Type of screening: Colonoscopy. Completed 2016. Repeat every 10 years  Mammogram status: Completed  . Repeat every year  Bone Density status: Ordered  . Pt provided with contact info and advised to call to schedule appt.  Lung Cancer Screening: (Low Dose CT Chest recommended if Age 81-80 years, 20 pack-year currently smoking OR have quit w/in 15years.) does not qualify.   Lung Cancer Screening Referral:   Additional Screening:  Hepatitis C Screening: does not qualify; Completed 2019  Vision Screening: Recommended annual ophthalmology exams for early detection of glaucoma and other  disorders of the eye. Is the patient up to date with their annual eye exam?  Yes  Who is the provider or what is the name of the office in which the patient attends annual eye exams? [facility] If pt is not established with a provider, would they like to be referred to a provider to establish care? No .   Dental Screening: Recommended annual dental exams for proper oral hygiene    Community Resource Referral / Chronic Care Management: CRR required this visit?  No   CCM required this visit?  No     Plan:     I have personally reviewed and noted the following in the patient's chart:   Medical and social history Use of alcohol, tobacco or illicit drugs  Current medications and supplements including opioid prescriptions. Patient is not currently taking opioid prescriptions. Functional ability and status Nutritional status Physical activity Advanced directives List of other physicians Hospitalizations, surgeries, and ER visits in previous 12 months Vitals Screenings to include cognitive, depression, and falls Referrals and appointments  In addition, I have reviewed and discussed with patient certain preventive protocols, quality metrics, and best practice recommendations. A written personalized care plan for preventive services as well as general preventive health recommendations were provided to patient.     Mliss Graff, LPN   [id number]   After Visit Summary: (MyChart) Due to this being a telephonic visit, the after visit summary with patients personalized plan was offered to patient via MyChart   Nurse Notes:

## 2024-02-05 ENCOUNTER — Other Ambulatory Visit: Payer: Self-pay | Admitting: Family Medicine

## 2024-02-05 DIAGNOSIS — E7800 Pure hypercholesterolemia, unspecified: Secondary | ICD-10-CM

## 2024-02-14 DIAGNOSIS — K080 Exfoliation of teeth due to systemic causes: Secondary | ICD-10-CM | POA: Diagnosis not present

## 2024-03-21 ENCOUNTER — Telehealth: Payer: Self-pay | Admitting: Family Medicine

## 2024-03-21 ENCOUNTER — Other Ambulatory Visit: Payer: Self-pay

## 2024-03-21 ENCOUNTER — Other Ambulatory Visit: Payer: Self-pay | Admitting: Family Medicine

## 2024-03-21 DIAGNOSIS — J45901 Unspecified asthma with (acute) exacerbation: Secondary | ICD-10-CM

## 2024-03-21 DIAGNOSIS — J069 Acute upper respiratory infection, unspecified: Secondary | ICD-10-CM

## 2024-03-21 MED ORDER — MONTELUKAST SODIUM 10 MG PO TABS: 10.0000 mg | ORAL_TABLET | Freq: Every day | ORAL | 1 refills | Status: AC

## 2024-03-21 NOTE — Telephone Encounter (Signed)
 Prescription Request  03/21/2024  LOV: 12/27/2023  What is the name of the medication or equipment?   montelukast  (SINGULAIR ) 10 MG tablet  **90 day script request**  Have you contacted your pharmacy to request a refill? Yes   Which pharmacy would you like this sent to?  [id number] [place], [place] - [address] [place] [address] Phone: [phone] Fax: [phone]    Patient notified that their request is being sent to the clinical staff for review and that they should receive a response within 2 business days.   Please advise pharmacist.

## 2024-03-29 ENCOUNTER — Other Ambulatory Visit: Payer: Self-pay

## 2024-03-29 ENCOUNTER — Telehealth: Payer: Self-pay

## 2024-03-29 MED ORDER — TRAZODONE HCL 100 MG PO TABS: 100.0000 mg | ORAL_TABLET | Freq: Every evening | ORAL | 3 refills | Status: AC | PRN

## 2024-03-29 NOTE — Telephone Encounter (Signed)
 Sent in medication

## 2024-03-29 NOTE — Telephone Encounter (Signed)
 Prescription Request  03/29/2024  LOV: 12/27/23  What is the name of the medication or equipment? traZODone  (DESYREL ) 100 MG tablet [[id number]]   Have you contacted your pharmacy to request a refill? Yes   Which pharmacy would you like this sent to?  [id number] [place], [place] - [address] [place] [address] Phone: [phone] Fax: [phone]    Patient notified that their request is being sent to the clinical staff for review and that they should receive a response within 2 business days.   Please advise at [facility] [phone]

## 2025-01-10 ENCOUNTER — Ambulatory Visit
